# Patient Record
Sex: Male | Born: 1935 | Race: White | Hispanic: No | Smoking: Current every day smoker
Health system: Southern US, Community
[De-identification: ages and names within clinical notes are randomized; demographics above are authoritative.]

## PROBLEM LIST (undated history)

## (undated) DIAGNOSIS — R42 Dizziness and giddiness: Secondary | ICD-10-CM

## (undated) DIAGNOSIS — I350 Nonrheumatic aortic (valve) stenosis: Secondary | ICD-10-CM

## (undated) DIAGNOSIS — E785 Hyperlipidemia, unspecified: Secondary | ICD-10-CM

## (undated) DIAGNOSIS — Z952 Presence of prosthetic heart valve: Secondary | ICD-10-CM

## (undated) DIAGNOSIS — K635 Polyp of colon: Secondary | ICD-10-CM

## (undated) DIAGNOSIS — M1990 Unspecified osteoarthritis, unspecified site: Secondary | ICD-10-CM

## (undated) DIAGNOSIS — I10 Essential (primary) hypertension: Secondary | ICD-10-CM

## (undated) DIAGNOSIS — R911 Solitary pulmonary nodule: Secondary | ICD-10-CM

## (undated) DIAGNOSIS — J210 Acute bronchiolitis due to respiratory syncytial virus: Secondary | ICD-10-CM

## (undated) HISTORY — PX: CATARACT EXTRACTION: SUR2

## (undated) HISTORY — PX: EXCISIONAL HEMORRHOIDECTOMY: SHX1541

## (undated) HISTORY — DX: Hyperlipidemia, unspecified: E78.5

## (undated) HISTORY — DX: Polyp of colon: K63.5

## (undated) HISTORY — DX: Solitary pulmonary nodule: R91.1

## (undated) HISTORY — DX: Nonrheumatic aortic (valve) stenosis: I35.0

## (undated) HISTORY — DX: Essential (primary) hypertension: I10

---

## 2000-08-26 DIAGNOSIS — K635 Polyp of colon: Secondary | ICD-10-CM

## 2000-08-26 HISTORY — DX: Polyp of colon: K63.5

## 2000-12-12 ENCOUNTER — Ambulatory Visit (HOSPITAL_COMMUNITY): Admission: RE | Admit: 2000-12-12 | Discharge: 2000-12-12 | Payer: Self-pay | Admitting: Internal Medicine

## 2001-08-17 ENCOUNTER — Ambulatory Visit (HOSPITAL_COMMUNITY): Admission: RE | Admit: 2001-08-17 | Discharge: 2001-08-17 | Payer: Self-pay | Admitting: General Surgery

## 2001-08-17 HISTORY — PX: COLONOSCOPY: SHX174

## 2011-07-12 ENCOUNTER — Encounter: Payer: Self-pay | Admitting: Internal Medicine

## 2011-07-15 ENCOUNTER — Ambulatory Visit (INDEPENDENT_AMBULATORY_CARE_PROVIDER_SITE_OTHER): Payer: Medicare Other | Admitting: Urgent Care

## 2011-07-15 ENCOUNTER — Encounter: Payer: Self-pay | Admitting: Urgent Care

## 2011-07-15 VITALS — BP 102/69 | HR 83 | Temp 97.8°F | Ht 66.0 in | Wt 186.0 lb

## 2011-07-15 DIAGNOSIS — Z86010 Personal history of colonic polyps: Secondary | ICD-10-CM

## 2011-07-15 NOTE — Assessment & Plan Note (Signed)
Travis Murphy is a 76 y.o. male here to set up average risk screening colonoscopy.  He does have history of a hyperplastic polyp removed in 2002.  He denies any GI symptoms at this time.  I have discussed risks & benefits which include, but are not limited to, bleeding, infection, perforation & drug reaction.  The patient agrees with this plan & written consent will be obtained.

## 2011-07-15 NOTE — Progress Notes (Signed)
Cc to PCP 

## 2011-07-15 NOTE — Patient Instructions (Addendum)
Keep colonoscopy as planned 

## 2011-07-15 NOTE — Progress Notes (Addendum)
Referring Provider:  Dr Ouida Sills Primary Care Physician:  Carylon Perches, MD Primary Gastroenterologist:  Dr. Jena Gauss  Chief Complaint  Patient presents with  . Colonoscopy    HPI:  Travis Murphy is a 76 y.o. male here as a referral from Dr. Ouida Sills to set up colonoscopy. He gives history of colon polyp in 2002.  This was actually a hyperplastic polyp.   Denies any lower GI symptoms including constipation, diarrhea, rectal bleeding, melena or weight loss.   Denies any upper GI symptoms including heartburn, indigestion, nausea, vomiting, dysphagia, odynophagia or anorexia.    Past Medical History  Diagnosis Date  . Hypertension   . Hyperplastic colon polyp 2002    Dr. Lovell Sheehan  . Hyperlipemia   . Aortic sclerosis   . Diabetes mellitus     diet-controlled    Past Surgical History  Procedure Date  . Colonoscopy 08/17/2001    small polyp measured 3 mm removed/multiple middle scattered diverticula in the ascending colon-hyperplastic    Current Outpatient Prescriptions  Medication Sig Dispense Refill  . amLODipine-atorvastatin (CADUET) 5-10 MG per tablet Take 1 tablet by mouth daily.       Marland Kitchen aspirin 81 MG tablet Take 81 mg by mouth daily.        . ramipril (ALTACE) 10 MG capsule Take 10 mg by mouth daily.         Allergies as of 07/15/2011  . (No Known Allergies)    Family history:There is no known family history of colorectal carcinoma , liver disease, or inflammatory bowel disease.   History   Social History  . Marital Status: Married    Spouse Name: N/A    Number of Children: 2  . Years of Education: N/A   Occupational History  . retired; [employer]    Social History Main Topics  . Smoking status: Former Smoker -- 0.5 packs/day for 20 years    Types: Cigarettes    Quit date: 07/10/2011  . Smokeless tobacco: Not on file   Comment: quit about 4-5 yrs ago  . Alcohol Use: No  . Drug Use: No  . Sexually Active: Not on file   Other Topics Concern  . Not on file    Social History Narrative   Widower (married for 87yrs)   Review of Systems: Gen: Denies any fever, chills, sweats, anorexia, fatigue, weakness, malaise, weight loss, and sleep disorder CV: Denies chest pain, angina, palpitations, syncope, orthopnea, PND, peripheral edema, and claudication. Resp: Denies dyspnea at rest, dyspnea with exercise, cough, sputum, wheezing, coughing up blood, and pleurisy. GI: Denies vomiting blood, jaundice, and fecal incontinence.   Denies dysphagia or odynophagia. GU : Denies urinary burning, blood in urine, urinary frequency, urinary hesitancy, nocturnal urination, and urinary incontinence. MS: Denies joint pain, limitation of movement, and swelling, stiffness, low back pain, extremity pain. Denies muscle weakness, cramps, atrophy.  Derm: Denies rash, itching, dry skin, hives, moles, warts, or unhealing ulcers.  Psych: Denies depression, anxiety, memory loss, suicidal ideation, hallucinations, paranoia, and confusion. Heme: Denies bruising, bleeding, and enlarged lymph nodes.  Physical Exam: BP 102/69  Pulse 83  Temp(Src) 97.8 F (36.6 C) (Temporal)  Ht 5\' 6"  (1.676 m)  Wt 186 lb (84.369 kg)  BMI 30.02 kg/m2 General:   Alert,  Well-developed, well-nourished, pleasant and cooperative in NAD Head:  Normocephalic and atraumatic. Eyes:  Sclera clear, no icterus.   Conjunctiva pink. Ears:  Normal auditory acuity. Nose:  No deformity, discharge,  or lesions. Mouth:  No deformity or  lesions, oropharynx pink & moist. Neck:  Supple; no masses or thyromegaly. Lungs:  Clear throughout to auscultation.   No wheezes, crackles, or rhonchi. No acute distress. Heart:  Regular rate and rhythm; no murmurs, clicks, rubs,  or gallops. Abdomen:  Soft, nontender and nondistended. No masses, hepatosplenomegaly or hernias noted. Normal bowel sounds, without guarding, and without rebound.   Rectal:  Deferred until time of colonoscopy.   Msk:  Symmetrical without gross  deformities. Normal posture. Pulses:  Normal pulses noted. Extremities:  Without clubbing or edema. Neurologic:  Alert and  oriented x4;  grossly normal neurologically. Skin:  Intact without significant lesions or rashes. Cervical Nodes:  No significant cervical adenopathy. Psych:  Alert and cooperative. Normal mood and affect.

## 2011-08-01 ENCOUNTER — Encounter (HOSPITAL_COMMUNITY): Payer: Self-pay | Admitting: Pharmacy Technician

## 2011-08-13 MED ORDER — SODIUM CHLORIDE 0.45 % IV SOLN
Freq: Once | INTRAVENOUS | Status: AC
  Administered 2011-08-14: 10:00:00 via INTRAVENOUS

## 2011-08-14 ENCOUNTER — Encounter (HOSPITAL_COMMUNITY)
Admission: RE | Disposition: A | Discharge status: Home / Self Care | Payer: Self-pay | Source: Ambulatory Visit | Attending: Internal Medicine

## 2011-08-14 ENCOUNTER — Encounter (HOSPITAL_COMMUNITY): Payer: Self-pay | Admitting: *Deleted

## 2011-08-14 ENCOUNTER — Ambulatory Visit (HOSPITAL_COMMUNITY)
Admission: RE | Admit: 2011-08-14 | Discharge: 2011-08-14 | Disposition: A | Discharge status: Home / Self Care | Payer: Medicare Other | Source: Ambulatory Visit | Attending: Internal Medicine | Admitting: Internal Medicine

## 2011-08-14 ENCOUNTER — Other Ambulatory Visit: Payer: Self-pay | Admitting: Internal Medicine

## 2011-08-14 DIAGNOSIS — Z86010 Personal history of colonic polyps: Secondary | ICD-10-CM

## 2011-08-14 DIAGNOSIS — Z1211 Encounter for screening for malignant neoplasm of colon: Secondary | ICD-10-CM

## 2011-08-14 DIAGNOSIS — E119 Type 2 diabetes mellitus without complications: Secondary | ICD-10-CM | POA: Insufficient documentation

## 2011-08-14 DIAGNOSIS — I10 Essential (primary) hypertension: Secondary | ICD-10-CM | POA: Insufficient documentation

## 2011-08-14 DIAGNOSIS — Z79899 Other long term (current) drug therapy: Secondary | ICD-10-CM | POA: Insufficient documentation

## 2011-08-14 DIAGNOSIS — D126 Benign neoplasm of colon, unspecified: Secondary | ICD-10-CM

## 2011-08-14 DIAGNOSIS — E785 Hyperlipidemia, unspecified: Secondary | ICD-10-CM | POA: Insufficient documentation

## 2011-08-14 DIAGNOSIS — Z7982 Long term (current) use of aspirin: Secondary | ICD-10-CM | POA: Insufficient documentation

## 2011-08-14 DIAGNOSIS — K5730 Diverticulosis of large intestine without perforation or abscess without bleeding: Secondary | ICD-10-CM | POA: Insufficient documentation

## 2011-08-14 HISTORY — PX: COLONOSCOPY: SHX5424

## 2011-08-14 SURGERY — COLONOSCOPY
Anesthesia: Moderate Sedation

## 2011-08-14 MED ORDER — STERILE WATER FOR IRRIGATION IR SOLN
Status: DC | PRN
  Administered 2011-08-14: 10:00:00

## 2011-08-14 MED ORDER — MIDAZOLAM HCL 5 MG/5ML IJ SOLN
INTRAMUSCULAR | Status: AC
  Filled 2011-08-14: qty 10

## 2011-08-14 MED ORDER — MEPERIDINE HCL 100 MG/ML IJ SOLN
INTRAMUSCULAR | Status: AC
  Filled 2011-08-14: qty 2

## 2011-08-14 MED ORDER — MIDAZOLAM HCL 5 MG/5ML IJ SOLN
INTRAMUSCULAR | Status: DC | PRN
  Administered 2011-08-14: 2 mg via INTRAVENOUS

## 2011-08-14 MED ORDER — MEPERIDINE HCL 100 MG/ML IJ SOLN
INTRAMUSCULAR | Status: DC | PRN
  Administered 2011-08-14: 50 mg via INTRAVENOUS

## 2011-08-14 NOTE — Op Note (Signed)
[facility] [address] [place], [place]  [address]  COLONOSCOPY PROCEDURE REPORT  PATIENT:  Travis, Murphy  MR#:  [id number] BIRTHDATE:  [birth year], 76 yrs. old  GENDER:  male ENDOSCOPIST:  R. Roetta Sessions, MD FACP [facility] REF. BY:           Dr. Carylon Perches PROCEDURE DATE:  08/14/2011 PROCEDURE:    Colonoscopy and snare polypectomy  INDICATIONS:   Screening colonoscopy-average risk (last colonoscopy 10 years ago-hyperplastic polyp )  INFORMED CONSENT:  The risks, benefits, alternatives and imponderables including but not limited to bleeding, perforation as well as the possibility of a missed lesion have been reviewed. The potential for biopsy, lesion removal, etc. have also been discussed.  Questions have been answered.  All parties agreeable. Please see the history and physical in the medical record for more information.  MEDICATIONS:   Versed 2 mg IV and Demerol 50 mg IV single dose  DESCRIPTION OF PROCEDURE:  After a digital rectal exam was performed, the EC-[id number] ([id number]) colonoscope was advanced from the anus through the rectum and colon to the area of the cecum, ileocecal valve and appendiceal orifice.  The cecum was deeply intubated.  These structures were well-seen and photographed for the record.  From the level of the cecum and ileocecal valve, the scope was slowly and cautiously withdrawn.  The mucosal surfaces were carefully surveyed utilizing scope tip deflection to facilitate fold flattening as needed.  The scope was pulled down into the rectum where a thorough examination including retroflexion was performed. <<PROCEDUREIMAGES>>  FINDINGS: good prep. (1) 8 mm pedunculated polyp in the mid descending colon and pan-colonic diverticula ; remainder colonic and rectal mucosa       appeared normal  THERAPEUTIC / DIAGNOSTIC MANEUVERS PERFORMED: the above mentioned polyp was hot snare removed cleanly and recovered for the pathologist  COMPLICATIONS:    none  CECAL WITHDRAWAL TIME: 9 minutes  IMPRESSION:   Colonic polyp-removed as described above. Pancolonic diverticulosis  RECOMMENDATIONS: Followup on pathology report  ______________________________ R. Roetta Sessions, MD [name]  CC:  n. eSIGNED:   R. Roetta Sessions at 08/14/2011 10:38 AM  Curlene Dolphin, [phone]

## 2011-08-14 NOTE — H&P (Signed)
  I have seen & examined the patient prior to the procedure(s) today and reviewed the history and physical/consultation.  There have been no changes.  After consideration of the risks, benefits, alternatives and imponderables, the patient has consented to the procedure(s).   

## 2011-08-29 ENCOUNTER — Encounter (HOSPITAL_COMMUNITY): Payer: Self-pay | Admitting: Internal Medicine

## 2011-11-06 ENCOUNTER — Ambulatory Visit (HOSPITAL_COMMUNITY)
Admission: RE | Admit: 2011-11-06 | Discharge: 2011-11-06 | Disposition: A | Discharge status: Home / Self Care | Payer: Medicare Other | Source: Ambulatory Visit | Attending: Internal Medicine | Admitting: Internal Medicine

## 2011-11-06 DIAGNOSIS — I359 Nonrheumatic aortic valve disorder, unspecified: Secondary | ICD-10-CM | POA: Insufficient documentation

## 2011-11-06 DIAGNOSIS — I10 Essential (primary) hypertension: Secondary | ICD-10-CM | POA: Insufficient documentation

## 2011-11-06 NOTE — Progress Notes (Signed)
*  PRELIMINARY RESULTS* Echocardiogram 2D Echocardiogram has been performed.  Travis Murphy 11/06/2011, 8:19 AM

## 2012-08-19 ENCOUNTER — Encounter (HOSPITAL_COMMUNITY): Payer: Self-pay | Admitting: Emergency Medicine

## 2012-08-19 ENCOUNTER — Emergency Department (HOSPITAL_COMMUNITY): Payer: Medicare Other

## 2012-08-19 ENCOUNTER — Emergency Department (HOSPITAL_COMMUNITY)
Admission: EM | Admit: 2012-08-19 | Discharge: 2012-08-19 | Disposition: A | Discharge status: Home / Self Care | Payer: Medicare Other | Attending: Emergency Medicine | Admitting: Emergency Medicine

## 2012-08-19 DIAGNOSIS — I10 Essential (primary) hypertension: Secondary | ICD-10-CM | POA: Insufficient documentation

## 2012-08-19 DIAGNOSIS — Z860100 Personal history of colon polyps, unspecified: Secondary | ICD-10-CM | POA: Insufficient documentation

## 2012-08-19 DIAGNOSIS — Z8679 Personal history of other diseases of the circulatory system: Secondary | ICD-10-CM | POA: Insufficient documentation

## 2012-08-19 DIAGNOSIS — F17200 Nicotine dependence, unspecified, uncomplicated: Secondary | ICD-10-CM | POA: Insufficient documentation

## 2012-08-19 DIAGNOSIS — R112 Nausea with vomiting, unspecified: Secondary | ICD-10-CM | POA: Insufficient documentation

## 2012-08-19 DIAGNOSIS — Z79899 Other long term (current) drug therapy: Secondary | ICD-10-CM | POA: Insufficient documentation

## 2012-08-19 DIAGNOSIS — E785 Hyperlipidemia, unspecified: Secondary | ICD-10-CM | POA: Insufficient documentation

## 2012-08-19 DIAGNOSIS — Z7982 Long term (current) use of aspirin: Secondary | ICD-10-CM | POA: Insufficient documentation

## 2012-08-19 DIAGNOSIS — R42 Dizziness and giddiness: Secondary | ICD-10-CM | POA: Insufficient documentation

## 2012-08-19 LAB — BASIC METABOLIC PANEL
Chloride: 102 mEq/L (ref 96–112)
Creatinine, Ser: 0.79 mg/dL (ref 0.50–1.35)
GFR calc Af Amer: >90 mL/min (ref >90)
Potassium: 3.8 mEq/L (ref 3.5–5.1)
Sodium: 139 mEq/L (ref 135–145)

## 2012-08-19 LAB — CBC WITH DIFFERENTIAL/PLATELET
Basophils Absolute: 0 10*3/uL (ref 0.0–0.1)
Basophils Relative: 0 % (ref 0–1)
MCHC: 35.2 g/dL (ref 30.0–36.0)
Monocytes Absolute: 0.4 10*3/uL (ref 0.1–1.0)
Neutro Abs: 5.5 10*3/uL (ref 1.7–7.7)
Neutrophils Relative %: 70 % (ref 43–77)
Platelets: 181 10*3/uL (ref 150–400)
RDW: 13.4 % (ref 11.5–15.5)

## 2012-08-19 MED ORDER — MECLIZINE HCL 12.5 MG PO TABS
ORAL_TABLET | ORAL | Status: AC
  Administered 2012-08-19: 50 mg
  Filled 2012-08-19: qty 4

## 2012-08-19 MED ORDER — ONDANSETRON HCL 4 MG/2ML IJ SOLN
4.0000 mg | Freq: Once | INTRAMUSCULAR | Status: AC
  Administered 2012-08-19: 4 mg via INTRAVENOUS
  Filled 2012-08-19: qty 2

## 2012-08-19 MED ORDER — MECLIZINE HCL 25 MG PO TABS: 25.0000 mg | ORAL_TABLET | Freq: Four times a day (QID) | ORAL | Status: DC

## 2012-08-19 MED ORDER — MECLIZINE HCL 12.5 MG PO TABS
25.0000 mg | ORAL_TABLET | Freq: Once | ORAL | Status: AC
  Administered 2012-08-19: 25 mg via ORAL
  Filled 2012-08-19: qty 2

## 2012-08-19 NOTE — ED Notes (Signed)
Cardiac monitor applied at time of arrival. NSR>

## 2012-08-19 NOTE — ED Notes (Signed)
Pt states he woke up dizzy this morning. Denies pain. States vomiting x 2 today. NSR showing on monitor. VSS.

## 2012-08-19 NOTE — ED Provider Notes (Signed)
History  This chart was scribed for Shelda Jakes, MD by Bennett Scrape, ED Scribe. This patient was seen in room APA09/APA09 and the patient's care was started at 11:40 AM.  CSN: [id number]  Arrival date & time 08/19/12  1024   First MD Initiated Contact with Patient 08/19/12 1140      Chief Complaint  Patient presents with  . Dizziness  . Emesis     The history is provided by the patient. No language interpreter was used.    Travis Murphy is a 77 y.o. male who presents to the Emergency Department complaining of 6 hours of sudden onset, non-changing, constant dizziness described as the room spinning with associated nausea and 2 episodes of non-bloody emesis, one in the ED. The dizziness is occasionally worse with head positioning and walking. He denies having prior episodes of similar symptoms. He denies any recent falls or head traumas. He denies having CP, abdominal pain, visual disturbances, and hearing changes as associated symptoms. He has a h/o HTN and HLD. He is a current everyday smoker but denies alcohol use.  PCP is Dr. Ouida Sills.   Past Medical History  Diagnosis Date  . Hypertension   . Hyperplastic colon polyp 2002    Dr. Lovell Sheehan  . Hyperlipemia   . Aortic sclerosis     Past Surgical History  Procedure Date  . Colonoscopy 08/17/2001    small polyp measured 3 mm removed/multiple middle scattered diverticula in the ascending colon-hyperplastic  . Colonoscopy 08/14/2011    Procedure: COLONOSCOPY;  Surgeon: Corbin Ade, MD;  [facility];  Service: Endoscopy;  Laterality: N/A;  10:00    History reviewed. No pertinent family history.  History  Substance Use Topics  . Smoking status: Current Every Day Smoker -- 0.5 packs/day for 21 years    Types: Cigarettes  . Smokeless tobacco: Never Used     Comment: quit about 4-5 yrs ago  . Alcohol Use: No      Review of Systems  Constitutional: Negative for fever and chills.  HENT: Negative  for congestion and sore throat.   Eyes: Negative for visual disturbance.  Respiratory: Negative for cough and shortness of breath.   Cardiovascular: Negative for chest pain.  Gastrointestinal: Positive for nausea and vomiting. Negative for abdominal pain and diarrhea.  Genitourinary: Negative for dysuria and hematuria.  Musculoskeletal: Negative for back pain.  Skin: Negative for rash.  Neurological: Positive for dizziness.  Hematological: Does not bruise/bleed easily.  All other systems reviewed and are negative.    Allergies  Review of patient's allergies indicates no known allergies.  Home Medications   Current Outpatient Rx  Name  Route  Sig  Dispense  Refill  . AMLODIPINE-ATORVASTATIN 5-10 MG PO TABS   Oral   Take 1 tablet by mouth daily.          . ASPIRIN EC 81 MG PO TBEC   Oral   Take 81 mg by mouth daily.           Marland Kitchen RAMIPRIL 10 MG PO CAPS   Oral   Take 10 mg by mouth daily.          Marland Kitchen MECLIZINE HCL 25 MG PO TABS   Oral   Take 1 tablet (25 mg total) by mouth 4 (four) times daily.   2 tablet   0   . MECLIZINE HCL 25 MG PO TABS   Oral   Take 1 tablet (25 mg total) by mouth 4 (  four) times daily.   28 tablet   0     Triage Vitals: BP 114/64  Pulse 73  Resp 16  SpO2 93%  Physical Exam  Nursing note and vitals reviewed. Constitutional: He is oriented to person, place, and time. He appears well-developed and well-nourished. No distress.  HENT:  Head: Normocephalic and atraumatic.  Mouth/Throat: Oropharynx is clear and moist. No oropharyngeal exudate.       Moist mucous membranes   Eyes: Conjunctivae normal and EOM are normal. Pupils are equal, round, and reactive to light.  Neck: Normal range of motion. Neck supple. No tracheal deviation present.  Cardiovascular: Normal rate and regular rhythm.   Murmur (mild systolic murmur) heard. Pulmonary/Chest: Effort normal and breath sounds normal. No respiratory distress.  Abdominal: Soft. Bowel sounds  are normal. There is no tenderness.  Musculoskeletal: Normal range of motion. He exhibits no edema.  Neurological: He is alert and oriented to person, place, and time. No cranial nerve deficit.       Pt able to move both sets of fingers and toes, normal heel to shin, no pronator's drift, unable to reproduce dizziness on exam  Skin: Skin is warm and dry.  Psychiatric: He has a normal mood and affect. His behavior is normal.    ED Course  Procedures (including critical care time)  DIAGNOSTIC STUDIES: Oxygen Saturation is 93% on room air, adequate by my interpretation.    COORDINATION OF CARE:  11:00 AM- Ordered 4 mg Zofran injection.  12:05 PM- Discussed treatment plan which includes CXR and CT scan of the head and antivert with pt at bedside and pt agreed to plan.   Labs Reviewed  BASIC METABOLIC PANEL - Abnormal; Notable for the following:    Glucose, Bld 168 (*)     GFR calc non Af Amer 86 (*)     All other components within normal limits  CBC WITH DIFFERENTIAL   Dg Chest 2 View  08/19/2012  *RADIOLOGY REPORT*  Clinical Data: Dizziness.  Smoker.  CHEST - 2 VIEW  Comparison: None.  Findings: There is peribronchial thickening.  No consolidative process, pneumothorax or effusion.  Heart size is normal. Elevation of the right hemidiaphragm and old right clavicle fracture noted.  IMPRESSION: Bronchitic change without focal process.   Original Report Authenticated By: Holley Dexter, M.D.    Ct Head Wo Contrast  08/19/2012  *RADIOLOGY REPORT*  Clinical Data: Dizziness  CT HEAD WITHOUT CONTRAST  Technique:  Contiguous axial images were obtained from the base of the skull through the vertex without contrast.  Comparison: None.  Findings: No skull fracture is noted.  Paranasal sinuses and mastoid air cells are unremarkable.  No intracranial hemorrhage, mass effect or midline shift.  Mild cerebral atrophy.  Periventricular and patchy subcortical white matter decreased attenuation is  probable due to chronic small vessel ischemic changes.  No definite acute cortical infarction. No mass lesion is noted on this unenhanced scan.  IMPRESSION: No acute intracranial abnormality.Mild cerebral atrophy. Periventricular and patchy subcortical white matter decreased attenuation is probable due to chronic small vessel ischemic changes.  No definite acute cortical infarction.   Original Report Authenticated By: Natasha Mead, M.D.      1. Vertigo       MDM   Patient improved in the emergency department head CT without any acute or new findings. Lab workup without any sniffing abnormalities. Patient with positional vertigo nausea and vomiting now improved. Patient was given Antivert in the emergency department. Patient will  be given 2 tablets of Antivert to go home with due to the fact that this is Christmas and many pharmacies may not be open. Prescription for additional Antivert provided. Patient will followup with primary care Dr. If not improving in the next few days return for any new or worse symptoms. Do not think this was an acute stroke however that is in the differential and the family knows return for any newer worse symptoms.      I personally performed the services described in this documentation, which was scribed in my presence. The recorded information has been reviewed and is accurate.     Shelda Jakes, MD 08/19/12 [phone]

## 2012-08-19 NOTE — ED Notes (Signed)
Patient reports waking this morning and feeling slightly dizzy. Per patient dizziness progressively getting worse. Patient reports vomiting x1 at home. Patient vomited x1 while being triaged. Patient's family reports patient having high blood pressure. Patient denies being sick prior to this, denies weakness.

## 2014-07-06 ENCOUNTER — Encounter: Payer: Self-pay | Admitting: Internal Medicine

## 2014-12-02 DIAGNOSIS — E785 Hyperlipidemia, unspecified: Secondary | ICD-10-CM | POA: Diagnosis not present

## 2014-12-02 DIAGNOSIS — Z79899 Other long term (current) drug therapy: Secondary | ICD-10-CM | POA: Diagnosis not present

## 2014-12-02 DIAGNOSIS — Z125 Encounter for screening for malignant neoplasm of prostate: Secondary | ICD-10-CM | POA: Diagnosis not present

## 2014-12-02 DIAGNOSIS — E119 Type 2 diabetes mellitus without complications: Secondary | ICD-10-CM | POA: Diagnosis not present

## 2014-12-02 DIAGNOSIS — I10 Essential (primary) hypertension: Secondary | ICD-10-CM | POA: Diagnosis not present

## 2014-12-09 DIAGNOSIS — Z0001 Encounter for general adult medical examination with abnormal findings: Secondary | ICD-10-CM | POA: Diagnosis not present

## 2014-12-09 DIAGNOSIS — I10 Essential (primary) hypertension: Secondary | ICD-10-CM | POA: Diagnosis not present

## 2014-12-09 DIAGNOSIS — E785 Hyperlipidemia, unspecified: Secondary | ICD-10-CM | POA: Diagnosis not present

## 2014-12-09 DIAGNOSIS — E1129 Type 2 diabetes mellitus with other diabetic kidney complication: Secondary | ICD-10-CM | POA: Diagnosis not present

## 2015-04-03 DIAGNOSIS — E119 Type 2 diabetes mellitus without complications: Secondary | ICD-10-CM | POA: Diagnosis not present

## 2015-04-11 DIAGNOSIS — R21 Rash and other nonspecific skin eruption: Secondary | ICD-10-CM | POA: Diagnosis not present

## 2015-04-11 DIAGNOSIS — E119 Type 2 diabetes mellitus without complications: Secondary | ICD-10-CM | POA: Diagnosis not present

## 2015-04-11 DIAGNOSIS — I10 Essential (primary) hypertension: Secondary | ICD-10-CM | POA: Diagnosis not present

## 2015-04-11 DIAGNOSIS — Z6830 Body mass index (BMI) 30.0-30.9, adult: Secondary | ICD-10-CM | POA: Diagnosis not present

## 2015-08-07 DIAGNOSIS — E119 Type 2 diabetes mellitus without complications: Secondary | ICD-10-CM | POA: Diagnosis not present

## 2015-08-14 DIAGNOSIS — E1129 Type 2 diabetes mellitus with other diabetic kidney complication: Secondary | ICD-10-CM | POA: Diagnosis not present

## 2015-08-14 DIAGNOSIS — I350 Nonrheumatic aortic (valve) stenosis: Secondary | ICD-10-CM | POA: Diagnosis not present

## 2015-08-14 DIAGNOSIS — I10 Essential (primary) hypertension: Secondary | ICD-10-CM | POA: Diagnosis not present

## 2015-08-14 DIAGNOSIS — Z6829 Body mass index (BMI) 29.0-29.9, adult: Secondary | ICD-10-CM | POA: Diagnosis not present

## 2015-12-07 DIAGNOSIS — Z79899 Other long term (current) drug therapy: Secondary | ICD-10-CM | POA: Diagnosis not present

## 2015-12-07 DIAGNOSIS — I10 Essential (primary) hypertension: Secondary | ICD-10-CM | POA: Diagnosis not present

## 2015-12-07 DIAGNOSIS — E785 Hyperlipidemia, unspecified: Secondary | ICD-10-CM | POA: Diagnosis not present

## 2015-12-07 DIAGNOSIS — I350 Nonrheumatic aortic (valve) stenosis: Secondary | ICD-10-CM | POA: Diagnosis not present

## 2015-12-07 DIAGNOSIS — E119 Type 2 diabetes mellitus without complications: Secondary | ICD-10-CM | POA: Diagnosis not present

## 2015-12-19 DIAGNOSIS — I350 Nonrheumatic aortic (valve) stenosis: Secondary | ICD-10-CM | POA: Diagnosis not present

## 2015-12-19 DIAGNOSIS — E1129 Type 2 diabetes mellitus with other diabetic kidney complication: Secondary | ICD-10-CM | POA: Diagnosis not present

## 2015-12-19 DIAGNOSIS — I10 Essential (primary) hypertension: Secondary | ICD-10-CM | POA: Diagnosis not present

## 2015-12-25 ENCOUNTER — Ambulatory Visit (HOSPITAL_COMMUNITY)
Admission: RE | Admit: 2015-12-25 | Discharge: 2015-12-25 | Disposition: A | Discharge status: Home / Self Care | Payer: Medicare Other | Source: Ambulatory Visit | Attending: Internal Medicine | Admitting: Internal Medicine

## 2015-12-25 DIAGNOSIS — I350 Nonrheumatic aortic (valve) stenosis: Secondary | ICD-10-CM | POA: Diagnosis not present

## 2015-12-25 DIAGNOSIS — I517 Cardiomegaly: Secondary | ICD-10-CM | POA: Diagnosis not present

## 2015-12-25 DIAGNOSIS — I071 Rheumatic tricuspid insufficiency: Secondary | ICD-10-CM | POA: Diagnosis not present

## 2015-12-25 DIAGNOSIS — I340 Nonrheumatic mitral (valve) insufficiency: Secondary | ICD-10-CM | POA: Insufficient documentation

## 2015-12-25 DIAGNOSIS — I5189 Other ill-defined heart diseases: Secondary | ICD-10-CM | POA: Diagnosis not present

## 2015-12-25 DIAGNOSIS — I359 Nonrheumatic aortic valve disorder, unspecified: Secondary | ICD-10-CM | POA: Diagnosis not present

## 2015-12-25 DIAGNOSIS — I352 Nonrheumatic aortic (valve) stenosis with insufficiency: Secondary | ICD-10-CM | POA: Diagnosis not present

## 2015-12-25 DIAGNOSIS — I515 Myocardial degeneration: Secondary | ICD-10-CM | POA: Insufficient documentation

## 2016-01-16 ENCOUNTER — Ambulatory Visit: Payer: Medicare Other | Admitting: Cardiovascular Disease

## 2016-01-25 ENCOUNTER — Encounter: Payer: Self-pay | Admitting: Cardiology

## 2016-01-25 ENCOUNTER — Ambulatory Visit (INDEPENDENT_AMBULATORY_CARE_PROVIDER_SITE_OTHER): Payer: Medicare Other | Admitting: Cardiology

## 2016-01-25 VITALS — BP 120/68 | HR 92 | Ht 65.0 in | Wt 159.0 lb

## 2016-01-25 DIAGNOSIS — I350 Nonrheumatic aortic (valve) stenosis: Secondary | ICD-10-CM

## 2016-01-25 DIAGNOSIS — I10 Essential (primary) hypertension: Secondary | ICD-10-CM | POA: Diagnosis not present

## 2016-01-25 DIAGNOSIS — E785 Hyperlipidemia, unspecified: Secondary | ICD-10-CM

## 2016-01-25 NOTE — Progress Notes (Signed)
Cardiology Office Note  Date: 01/25/2016   ID: JUDE SKIPTON, DOB [birth year], MRN [id number]  PCP: Asencion Noble, MD  Consulting Cardiologist: Rozann Lesches, MD   Chief Complaint  Patient presents with  . Aortic Stenosis    History of Present Illness: Travis Murphy is a 81 y.o. male referred for cardiology consultation by Dr. Willey Blade. He has a history of aortic stenosis with progression to severe range based on his most recent echocardiogram from May of this year. He is a widower, takes care of all of his ADLs including yard work. He enjoys walking 1 mile most days of the week for exercise, also plays golf. He does not endorse any substantial change in overall stamina over the last year. No palpitations, exertional dizziness, or syncope. Reports NYHA class II dyspnea. He has been aware of having a heart murmur and valve condition for several years.  Today we discussed the natural history of aortic stenosis, treatment options including surgical correction and TAVR in some cases.  We went over his medications which are outlined below. He reports no intolerances.  He states that he is retired from [employer] where he worked for 37 years.  Past Medical History  Diagnosis Date  . Essential hypertension   . Hyperplastic colon polyp 2002    Dr. Arnoldo Morale  . Hyperlipemia   . Aortic stenosis   . Type 2 diabetes mellitus ([place])   . Clavicular fracture     Past Surgical History  Procedure Laterality Date  . Colonoscopy  08/17/2001    Small polyp measured 3 mm removed/multiple middle scattered diverticula in the ascending colon-hyperplastic  . Colonoscopy  08/14/2011    Procedure: COLONOSCOPY;  Surgeon: Daneil Dolin, MD;  Location[facility];  Service: Endoscopy;  Laterality: N/A;  10:00  . Cataract surgery Right     Current Outpatient Prescriptions  Medication Sig Dispense Refill  . amLODipine (NORVASC) 5 MG tablet Take 5 mg by mouth daily.  4  . aspirin EC 81 MG tablet  Take 81 mg by mouth daily.      Marland Kitchen atorvastatin (LIPITOR) 10 MG tablet Take 10 mg by mouth daily.  4  . meclizine (ANTIVERT) 25 MG tablet Take 1 tablet (25 mg total) by mouth 4 (four) times daily. 2 tablet 0  . ramipril (ALTACE) 10 MG capsule Take 10 mg by mouth daily.      No current facility-administered medications for this visit.   Allergies:  Review of patient's allergies indicates no known allergies.   Social History: The patient  reports that he has quit smoking. His smoking use included Cigarettes. He has a 10.5 pack-year smoking history. He has never used smokeless tobacco. He reports that he does not drink alcohol or use illicit drugs.   Family History: The patient's family history includes Heart attack in his father; Liver disease in his sister; Renal Disease in his brother; Stroke in his mother.   ROS:  Please see the history of present illness. Otherwise, complete review of systems is positive for seasonal allergies.  All other systems are reviewed and negative.   Physical Exam: VS:  BP 120/68 mmHg  Pulse 92  Ht 5\' 5"  (1.651 m)  Wt 159 lb (72.122 kg)  BMI 26.46 kg/m2  SpO2 92%, BMI Body mass index is 26.46 kg/(m^2).  Wt Readings from Last 3 Encounters:  01/25/16 159 lb (72.122 kg)  08/14/11 186 lb (84.369 kg)  07/15/11 186 lb (84.369 kg)    General:  Elderly male, appears comfortable at rest. HEENT: Conjunctiva and lids normal, oropharynx clear. Neck: Supple, no elevated JVP or carotid bruits, no thyromegaly. Lungs: Clear to auscultation, nonlabored breathing at rest. Cardiac: Regular rate and rhythm, no S3, 3/6 systolic murmur with diminished second heart sound and radiation to the apex with Galliverdin effect, no pericardial rub. Abdomen: Soft, nontender, bowel sounds present, no guarding or rebound. Extremities: No pitting edema, distal pulses 2+. Skin: Warm and dry. Musculoskeletal: Mild kyphosis. Neuropsychiatric: Alert and oriented x3, affect grossly  appropriate.  ECG: I personally reviewed the tracing from 12/19/2015 which showed sinus rhythm with anteroseptal Q waves..  Recent Labwork:  April 2017: BUN 13, creatinine 0.8, potassium 4.1, AST 29, ALT 32, hemoglobin 15.9, platelets 180, cholesterol 77, triglycerides 63, HDL 26, LDL 38, hemoglobin A1c 6.5  Other Studies Reviewed Today:  Echocardiogram 12/25/2015: Study Conclusions  - Left ventricle: The cavity size was normal. There was moderate  focal basal hypertrophy of the septum. Systolic function was  normal. The estimated ejection fraction was in the range of 60%  to 65%. Wall motion was normal; there were no regional wall  motion abnormalities. Doppler parameters are consistent with  abnormal left ventricular relaxation (grade 1 diastolic  dysfunction). - Aortic valve: Mildly calcified annulus. Trileaflet; moderately  calcified leaflets. There was severe stenosis. There was mild  regurgitation. Mean gradient (S): 40 mm Hg. Peak gradient (S): 76  mm Hg. Valve area (VTI): 1.02 cm^2. Valve area (Vmax): 0.87 cm^2.  Valve area (Vmean): 0.92 cm^2. - Mitral valve: Calcified annulus. There was trivial regurgitation. - Right atrium: Central venous pressure (est): 3 mm Hg. - Tricuspid valve: There was trivial regurgitation. - Pulmonary arteries: Systolic pressure could not be accurately  estimated. - Pericardium, extracardiac: A prominent pericardial fat pad was  present.  Impressions:  - Moderate basal septal LV hypertrophy with LVEF 60-65%. Grade 1  diastolic dysfunction with intermediate LV filling pressure. MAC  with trivial mitral regurgitation. Severe calcific aortic  stenosis with mild aortic regurgitation. Gradients have increased  compared to the previous study in 2013. Trivial tricuspid  regurgitation.  Chest x-ray 08/19/2012: Findings: There is peribronchial thickening. No consolidative process, pneumothorax or effusion. Heart size is  normal. Elevation of the right hemidiaphragm and old right clavicle fracture noted.  IMPRESSION: Bronchitic change without focal process.  Assessment and Plan:  1. Severe aortic stenosis by echocardiogram done recently. Patient is reasonably stable from a functional and symptomatic perspective at this time. We have discussed the natural history of aortic stenosis and likelihood that he will require surgical repair, generally within the next year or so. Plan to have him follow-up in 6 months with repeat echocardiogram.  2. Essential hypertension, no changes made present regimen. He is on Altace and Norvasc.  3. Hyperlipidemia, on Lipitor, LDL 38.  Current medicines were reviewed with the patient today.    Orders Placed This Encounter  Procedures  . ECHOCARDIOGRAM COMPLETE    Disposition: FU with me in 6 months.   Signed, Satira Sark, MD, [facility] 01/25/2016 8:34 AM    [facility] [address] [place], [place] [address] Phone: [phone]; Fax: [phone]

## 2016-01-25 NOTE — Patient Instructions (Signed)
Your physician wants you to follow-up in:  6 months You will receive a reminder letter in the mail two months in advance. If you don't receive a letter, please call our office to schedule the follow-up appointment.   Your physician has requested that you have an echocardiogram JUST BEFORE YOUR NEXT VISIT. Echocardiography is a painless test that uses sound waves to create images of your heart. It provides your doctor with information about the size and shape of your heart and how well your heart's chambers and valves are working. This procedure takes approximately one hour. There are no restrictions for this procedure.   Your physician recommends that you continue on your current medications as directed. Please refer to the Current Medication list given to you today.     Thank you for choosing [facility] !

## 2016-03-22 DIAGNOSIS — H2512 Age-related nuclear cataract, left eye: Secondary | ICD-10-CM | POA: Diagnosis not present

## 2016-03-22 DIAGNOSIS — D2311 Other benign neoplasm of skin of right eyelid, including canthus: Secondary | ICD-10-CM | POA: Diagnosis not present

## 2016-03-22 DIAGNOSIS — Z961 Presence of intraocular lens: Secondary | ICD-10-CM | POA: Diagnosis not present

## 2016-04-17 DIAGNOSIS — E119 Type 2 diabetes mellitus without complications: Secondary | ICD-10-CM | POA: Diagnosis not present

## 2016-04-24 DIAGNOSIS — I350 Nonrheumatic aortic (valve) stenosis: Secondary | ICD-10-CM | POA: Diagnosis not present

## 2016-04-24 DIAGNOSIS — E1129 Type 2 diabetes mellitus with other diabetic kidney complication: Secondary | ICD-10-CM | POA: Diagnosis not present

## 2016-08-21 DIAGNOSIS — E119 Type 2 diabetes mellitus without complications: Secondary | ICD-10-CM | POA: Diagnosis not present

## 2016-08-28 DIAGNOSIS — E119 Type 2 diabetes mellitus without complications: Secondary | ICD-10-CM | POA: Diagnosis not present

## 2016-08-28 DIAGNOSIS — Z6824 Body mass index (BMI) 24.0-24.9, adult: Secondary | ICD-10-CM | POA: Diagnosis not present

## 2016-08-28 DIAGNOSIS — I350 Nonrheumatic aortic (valve) stenosis: Secondary | ICD-10-CM | POA: Diagnosis not present

## 2016-12-26 DIAGNOSIS — E119 Type 2 diabetes mellitus without complications: Secondary | ICD-10-CM | POA: Diagnosis not present

## 2016-12-26 DIAGNOSIS — Z79899 Other long term (current) drug therapy: Secondary | ICD-10-CM | POA: Diagnosis not present

## 2016-12-26 DIAGNOSIS — I10 Essential (primary) hypertension: Secondary | ICD-10-CM | POA: Diagnosis not present

## 2016-12-26 DIAGNOSIS — E785 Hyperlipidemia, unspecified: Secondary | ICD-10-CM | POA: Diagnosis not present

## 2016-12-26 DIAGNOSIS — I350 Nonrheumatic aortic (valve) stenosis: Secondary | ICD-10-CM | POA: Diagnosis not present

## 2017-01-02 DIAGNOSIS — I10 Essential (primary) hypertension: Secondary | ICD-10-CM | POA: Diagnosis not present

## 2017-01-02 DIAGNOSIS — I482 Chronic atrial fibrillation: Secondary | ICD-10-CM | POA: Diagnosis not present

## 2017-01-02 DIAGNOSIS — E785 Hyperlipidemia, unspecified: Secondary | ICD-10-CM | POA: Diagnosis not present

## 2017-01-02 DIAGNOSIS — Z0001 Encounter for general adult medical examination with abnormal findings: Secondary | ICD-10-CM | POA: Diagnosis not present

## 2017-05-07 DIAGNOSIS — E119 Type 2 diabetes mellitus without complications: Secondary | ICD-10-CM | POA: Diagnosis not present

## 2017-05-14 DIAGNOSIS — E1129 Type 2 diabetes mellitus with other diabetic kidney complication: Secondary | ICD-10-CM | POA: Diagnosis not present

## 2017-09-08 DIAGNOSIS — E1129 Type 2 diabetes mellitus with other diabetic kidney complication: Secondary | ICD-10-CM | POA: Diagnosis not present

## 2017-09-15 DIAGNOSIS — I10 Essential (primary) hypertension: Secondary | ICD-10-CM | POA: Diagnosis not present

## 2017-09-15 DIAGNOSIS — E1129 Type 2 diabetes mellitus with other diabetic kidney complication: Secondary | ICD-10-CM | POA: Diagnosis not present

## 2017-09-15 DIAGNOSIS — I350 Nonrheumatic aortic (valve) stenosis: Secondary | ICD-10-CM | POA: Diagnosis not present

## 2018-01-01 DIAGNOSIS — H5203 Hypermetropia, bilateral: Secondary | ICD-10-CM | POA: Diagnosis not present

## 2018-01-01 DIAGNOSIS — H524 Presbyopia: Secondary | ICD-10-CM | POA: Diagnosis not present

## 2018-01-01 DIAGNOSIS — H2512 Age-related nuclear cataract, left eye: Secondary | ICD-10-CM | POA: Diagnosis not present

## 2018-01-01 DIAGNOSIS — H52223 Regular astigmatism, bilateral: Secondary | ICD-10-CM | POA: Diagnosis not present

## 2018-01-08 DIAGNOSIS — Z79899 Other long term (current) drug therapy: Secondary | ICD-10-CM | POA: Diagnosis not present

## 2018-01-08 DIAGNOSIS — E1129 Type 2 diabetes mellitus with other diabetic kidney complication: Secondary | ICD-10-CM | POA: Diagnosis not present

## 2018-01-08 DIAGNOSIS — I10 Essential (primary) hypertension: Secondary | ICD-10-CM | POA: Diagnosis not present

## 2018-01-08 DIAGNOSIS — I350 Nonrheumatic aortic (valve) stenosis: Secondary | ICD-10-CM | POA: Diagnosis not present

## 2018-01-08 DIAGNOSIS — E785 Hyperlipidemia, unspecified: Secondary | ICD-10-CM | POA: Diagnosis not present

## 2018-01-15 DIAGNOSIS — I350 Nonrheumatic aortic (valve) stenosis: Secondary | ICD-10-CM | POA: Diagnosis not present

## 2018-01-15 DIAGNOSIS — E1122 Type 2 diabetes mellitus with diabetic chronic kidney disease: Secondary | ICD-10-CM | POA: Diagnosis not present

## 2018-01-15 DIAGNOSIS — E785 Hyperlipidemia, unspecified: Secondary | ICD-10-CM | POA: Diagnosis not present

## 2018-01-15 DIAGNOSIS — Z0001 Encounter for general adult medical examination with abnormal findings: Secondary | ICD-10-CM | POA: Diagnosis not present

## 2018-01-26 ENCOUNTER — Other Ambulatory Visit (HOSPITAL_COMMUNITY): Payer: Self-pay | Admitting: Internal Medicine

## 2018-01-26 ENCOUNTER — Ambulatory Visit (HOSPITAL_COMMUNITY)
Admission: RE | Admit: 2018-01-26 | Discharge: 2018-01-26 | Disposition: A | Discharge status: Home / Self Care | Payer: Medicare Other | Source: Ambulatory Visit | Attending: Internal Medicine | Admitting: Internal Medicine

## 2018-01-26 DIAGNOSIS — Z87891 Personal history of nicotine dependence: Secondary | ICD-10-CM | POA: Insufficient documentation

## 2018-01-26 DIAGNOSIS — I350 Nonrheumatic aortic (valve) stenosis: Secondary | ICD-10-CM

## 2018-01-26 DIAGNOSIS — I083 Combined rheumatic disorders of mitral, aortic and tricuspid valves: Secondary | ICD-10-CM | POA: Insufficient documentation

## 2018-01-26 DIAGNOSIS — I5030 Unspecified diastolic (congestive) heart failure: Secondary | ICD-10-CM | POA: Diagnosis not present

## 2018-01-26 NOTE — Progress Notes (Signed)
*  PRELIMINARY RESULTS* Echocardiogram 2D Echocardiogram has been performed.  Travis Murphy 01/26/2018, 9:38 AM

## 2018-02-18 ENCOUNTER — Ambulatory Visit: Payer: Medicare Other | Admitting: Student

## 2018-02-18 ENCOUNTER — Encounter: Payer: Self-pay | Admitting: Student

## 2018-02-18 VITALS — BP 124/70 | HR 72 | Ht 65.0 in | Wt 150.2 lb

## 2018-02-18 DIAGNOSIS — I350 Nonrheumatic aortic (valve) stenosis: Secondary | ICD-10-CM

## 2018-02-18 DIAGNOSIS — I10 Essential (primary) hypertension: Secondary | ICD-10-CM | POA: Insufficient documentation

## 2018-02-18 DIAGNOSIS — R0609 Other forms of dyspnea: Secondary | ICD-10-CM

## 2018-02-18 DIAGNOSIS — E785 Hyperlipidemia, unspecified: Secondary | ICD-10-CM | POA: Diagnosis not present

## 2018-02-18 NOTE — Progress Notes (Signed)
Cardiology Office Note    Date:  02/18/2018   ID:  Travis Murphy, DOB [birth year], MRN [id number]  PCP:  Asencion Noble, MD  Cardiologist: Rozann Lesches, MD    Chief Complaint  Patient presents with  . Follow-up    Abnormal Echocardiogram    History of Present Illness:    Travis Murphy is a 83 y.o. male with past medical history of HTN and HLD who presents to the office today for overdue follow-up.   He was last examined by Dr. Domenic Murphy in 01/2016 as a new patient referral for aortic stenosis. He denied any recent chest pain or dyspnea on exertion at that time and was active and performing yard work regularly. Given that he denied any recent symptoms, is recommended he follow-up in 6 months with a repeat echocardiogram at that time. He has not been evaluated by Cardiology since.  A repeat echocardiogram was obtained by his PCP on 01/26/2018 and showed a preserved EF of 55-60%, no regional wall motion abnormalities, Grade 2 DD, and severe AS with mean gradient of 58 mm Hg and Valve area (VTI) at 0.55 cm^2.   In talking with the patient today, he initially reports to me that he has overall been doing well since his last office visit approximately 2 years ago and denies any recent changes in his dyspnea. Upon reviewing his echocardiogram results, I invited his wife and daughter into the room for further discussion of this with his permission. His wife reported that he has actually been having worsening shortness of breath over the past 5 to 6 months and that he used to play golf on a weekly basis but has not played since last fall due to his worsening respiratory status.  He denies any recent chest pain, palpitations, orthopnea, PND, lower extremity edema, lightheadedness, or presyncope. Does have occasional dizziness which he has accredited to his diagnosis of vertigo.   Past Medical History:  Diagnosis Date  . Aortic stenosis    a. severe AS by echo in 2017 b. 01/2018: repeat  echo showing EF of 55-60%, Grade 2 DD, and severe AS with mean gradient of 58 mm Hg  . Clavicular fracture   . Essential hypertension   . Hyperlipemia   . Hyperplastic colon polyp 2002   Dr. Arnoldo Murphy  . Type 2 diabetes mellitus ([place])     Past Surgical History:  Procedure Laterality Date  . Cataract surgery Right   . COLONOSCOPY  08/17/2001   Small polyp measured 3 mm removed/multiple middle scattered diverticula in the ascending colon-hyperplastic  . COLONOSCOPY  08/14/2011   Procedure: COLONOSCOPY;  Surgeon: Daneil Dolin, MD;  Location: [facility];  Service: Endoscopy;  Laterality: N/A;  10:00    Current Medications: Outpatient Medications Prior to Visit  Medication Sig Dispense Refill  . aspirin EC 81 MG tablet Take 81 mg by mouth daily.      Travis Murphy atorvastatin (LIPITOR) 10 MG tablet Take 10 mg by mouth daily.  4  . meclizine (ANTIVERT) 25 MG tablet Take 1 tablet (25 mg total) by mouth 4 (four) times daily. 2 tablet 0  . ramipril (ALTACE) 10 MG capsule Take 10 mg by mouth daily.     Travis Murphy amLODipine (NORVASC) 5 MG tablet Take 5 mg by mouth daily.  4   No facility-administered medications prior to visit.      Allergies:   Patient has no known allergies.   Social History   Socioeconomic History  . Marital  status: Widowed    Spouse name: Not on file  . Number of children: 2  . Years of education: Not on file  . Highest education level: Not on file  Occupational History  . Occupation: retired; [employer]  . Financial resource strain: Not on file  . Food insecurity:    Worry: Not on file    Inability: Not on file  . Transportation needs:    Medical: Not on file    Non-medical: Not on file  Tobacco Use  . Smoking status: Former Smoker    Packs/day: 0.50    Years: 21.00    Pack years: 10.50    Types: Cigarettes  . Smokeless tobacco: Never Used  . Tobacco comment: quit about 4-5 yrs ago  Substance and Sexual Activity  . Alcohol use: No    Alcohol/week:  0.0 oz  . Drug use: No  . Sexual activity: Not Currently  Lifestyle  . Physical activity:    Days per week: Not on file    Minutes per session: Not on file  . Stress: Not on file  Relationships  . Social connections:    Talks on phone: Not on file    Gets together: Not on file    Attends religious service: Not on file    Active member of club or organization: Not on file    Attends meetings of clubs or organizations: Not on file    Relationship status: Not on file  Other Topics Concern  . Not on file  Social History Narrative   Widower (married for 7yrs); Remarried in 2016        Family History:  The patient's family history includes Heart attack in his father; Liver disease in his sister; Renal Disease in his brother; Stroke in his mother.   Review of Systems:   Please see the history of present illness.     General:  No chills, fever, night sweats or weight changes.  Cardiovascular:  No chest pain, edema, orthopnea, palpitations, paroxysmal nocturnal dyspnea. Positive for dyspnea on exertion.  Dermatological: No rash, lesions/masses Respiratory: No cough, dyspnea Urologic: No hematuria, dysuria Abdominal:   No nausea, vomiting, diarrhea, bright red blood per rectum, melena, or hematemesis Neurologic:  No visual changes, wkns, changes in mental status. All other systems reviewed and are otherwise negative except as noted above.   Physical Exam:    VS:  BP 124/70   Pulse 72   Ht 5\' 5"  (1.651 m)   Wt 150 lb 3.2 oz (68.1 kg)   SpO2 93% Comment: on room air  BMI 24.99 kg/m    General: Well developed, elderly Caucasian male appearing in no acute distress. Head: Normocephalic, atraumatic, sclera non-icteric, no xanthomas, nares are without discharge.  Neck: No carotid bruits. JVD not elevated.  Lungs: Respirations regular and unlabored, without wheezes or rales.  Heart: Regular rate and rhythm. No S3 or S4.  No rubs or gallops appreciated. 3/6 SEM along RUSB.  Abdomen:  Soft, non-tender, non-distended with normoactive bowel sounds. No hepatomegaly. No rebound/guarding. No obvious abdominal masses. Msk:  Strength and tone appear normal for age. No joint deformities or effusions. Extremities: No clubbing or cyanosis. No lower extremity edema.  Distal pedal pulses are 2+ bilaterally. Neuro: Alert and oriented X 3. Moves all extremities spontaneously. No focal deficits noted. Psych:  Responds to questions appropriately with a normal affect. Skin: No rashes or lesions noted  Wt Readings from Last 3 Encounters:  02/18/18 150  lb 3.2 oz (68.1 kg)  01/25/16 159 lb (72.1 kg)  08/14/11 186 lb (84.4 kg)     Studies/Labs Reviewed:   EKG:  EKG is ordered today.  The ekg ordered today demonstrates NSR, HR 72, with PAC's. No acute ST or T-wave changes.    Recent Labs: No results found for requested labs within last 8760 hours.   Lipid Panel No results found for: CHOL, TRIG, HDL, CHOLHDL, VLDL, LDLCALC, LDLDIRECT  Additional studies/ records that were reviewed today include:   Echocardiogram: 01/26/2018 Study Conclusions  - Left ventricle: The cavity size was normal. Wall thickness was   increased in a pattern of mild LVH. Systolic function was normal.   The estimated ejection fraction was in the range of 55% to 60%.   Wall motion was normal; there were no regional wall motion   abnormalities. Features are consistent with a pseudonormal left   ventricular filling pattern, with concomitant abnormal relaxation   and increased filling pressure (grade 2 diastolic dysfunction). - Aortic valve: Functionally bicuspid; moderately calcified   leaflets. There was severe stenosis. There was mild   regurgitation. Mean gradient (S): 58 mm Hg. Peak gradient (S): 93   mm Hg. VTI ratio of LVOT to aortic valve: 0.17. Valve area (VTI):   0.55 cm^2. Valve area (Vmax): 0.51 cm^2. Valve area (Vmean): 0.47   cm^2. - Mitral valve: Mildly calcified annulus. There was mild    regurgitation. - Left atrium: The atrium was mildly dilated. - Right atrium: Central venous pressure (est): 3 mm Hg. - Tricuspid valve: There was mild regurgitation. - Pulmonary arteries: Systolic pressure could not be accurately   estimated. - Pericardium, extracardiac: There was no pericardial effusion.  Impressions:  - Trileaflet but functionally bicuspid aortic valve, moderately   calcified. There is severe aortic stenosis that has further   progressed compared to the prior study in 2017. Mild aortic   regurgitation.   Assessment:    1. Aortic valve stenosis, etiology of cardiac valve disease unspecified   2. Dyspnea on exertion   3. Essential hypertension   4. Hyperlipidemia, unspecified hyperlipidemia type      Plan:   In order of problems listed above:  1. Aortic Stenosis/ Dyspnea on Exertion - the patient is a very pleasant gentleman but is stoic in describing his symptoms and says he has overall done well but his family provide further insight into this and say that his worsening dyspnea has influenced his daily life as he is not as active in the yard as he once was and has not played golf in several months.  - his recent echo showed a preserved EF of 55-60%, no regional wall motion abnormalities, Grade 2 DD, and severe AS with mean gradient of 58 mm Hg and Valve area (VTI) at 0.55 cm^2. We reviewed the echo in detail along with the management and treatment options of AS and he is agreement for surgical intervention as he is active for his age. Will refer to the Structural Heart Team for consideration of AVR with possible TAVR. We discussed that he will need to have a R/LHC prior to any surgical treatments and he wishes to discuss valve replacement further with Dr. Burt Knack or Dr. Angelena Form prior to his cath being scheduled. Therefore, will send a staff message to Nell Range, PA-C today so he can get scheduled with an initial consult.   2. HTN - BP is well-controlled at  124/70 during today's visit.  - continue Ramipril 10mg  daily.  3. HLD - no recent labs available for review in EPIC. Followed by PCP and remains on Atorvastatin 10mg  daily.    Medication Adjustments/Labs and Tests Ordered: Current medicines are reviewed at length with the patient today.  Concerns regarding medicines are outlined above.  Medication changes, Labs and Tests ordered today are listed in the Patient Instructions below. Patient Instructions  Medication Instructions:  Your physician recommends that you continue on your current medications as directed. Please refer to the Current Medication list given to you today.  Labwork: NONE   Testing/Procedures: NONE   Follow-Up: Your physician recommends that you schedule a follow-up appointment in:   3 Months with Dr. Domenic Murphy. Will also be placing referral to the Structural Heart Team  Any Other Special Instructions Will Be Listed Below (If Applicable).  If you need a refill on your cardiac medications before your next appointment, please call your pharmacy.  Thank you for choosing [facility]!    Signed, Erma Heritage, PA-C  02/18/2018 7:54 PM    [facility] [address] [place], [place] [address] Phone: [phone]

## 2018-02-18 NOTE — Patient Instructions (Signed)
Medication Instructions:  Your physician recommends that you continue on your current medications as directed. Please refer to the Current Medication list given to you today.   Labwork: NONE  Testing/Procedures: NONE  Follow-Up: Your physician recommends that you schedule a follow-up appointment in: 3 Months with Dr. McDowell   Any Other Special Instructions Will Be Listed Below (If Applicable).     If you need a refill on your cardiac medications before your next appointment, please call your pharmacy. Thank you for choosing [facility]!    

## 2018-03-04 NOTE — H&P (View-Only) (Signed)
Valve Clinic Consult Note  Chief Complaint  Patient presents with  . New Patient (Initial Visit)    Severe aortic stenosis/TAVR discussion    History of Present Illness: 83 yo male with history of aortic stenosis, HTN, hyperlipidemia and borderline diabetes mellitus who is here today as a new consult for the evaluation of severe aortic stenosis. He has been followed for aortic stenosis for several years. Echocardiogram in 2017 showed normal LV systolic function with severe aortic stenosis. He was asymptomatic at that time. He has developed worsening dyspnea over the past few months. Echo was repeated on 01/26/18 and showed normal LV systolic function with YBOF=75-10%, grade 2 diastolic dysfunction. The aortic valve calcified with limited leaflet excursion. Mean gradient 58 mmHg, peak gradient 93 mmHg, AVA 0.55 cm2, DVI 0.15, consistent with severe aortic stenosis. He was recently seen in our [facility] cardiology office and noted reduced exercise tolerance, dyspnea with exertion and dizziness. He had played golf on a weekly basis up until last fall when he stopped due to dyspnea. No LE edema.   He goes to the dentist every 6 months. No active dental issues. He is retired from [employer]. He lives alone. His daughter lives nearby. Both daughters are here with him today.   Primary Care Physician: Asencion Noble, MD Primary Cardiologist: Rozann Lesches Referring Cardiologist: Rozann Lesches  Past Medical History:  Diagnosis Date  . Aortic stenosis    a. severe AS by echo in 2017 b. 01/2018: repeat echo showing EF of 55-60%, Grade 2 DD, and severe AS with mean gradient of 58 mm Hg  . Clavicular fracture   . Essential hypertension   . Hyperlipemia   . Hyperplastic colon polyp 2002   Dr. Arnoldo Morale  . Type 2 diabetes mellitus ([place])     Past Surgical History:  Procedure Laterality Date  . Cataract surgery Right   . COLONOSCOPY  08/17/2001   Small polyp measured 3 mm removed/multiple  middle scattered diverticula in the ascending colon-hyperplastic  . COLONOSCOPY  08/14/2011   Procedure: COLONOSCOPY;  Surgeon: Daneil Dolin, MD;  Location: [facility];  Service: Endoscopy;  Laterality: N/A;  10:00    Current Outpatient Medications  Medication Sig Dispense Refill  . aspirin EC 81 MG tablet Take 81 mg by mouth daily.      Marland Kitchen atorvastatin (LIPITOR) 10 MG tablet Take 10 mg by mouth daily.  4  . meclizine (ANTIVERT) 25 MG tablet Take 1 tablet (25 mg total) by mouth 4 (four) times daily. 2 tablet 0  . ramipril (ALTACE) 10 MG capsule Take 10 mg by mouth daily.      No current facility-administered medications for this visit.     No Known Allergies  Social History   Socioeconomic History  . Marital status: Widowed    Spouse name: Not on file  . Number of children: 2  . Years of education: Not on file  . Highest education level: Not on file  Occupational History  . Occupation: retired; [employer]  . Financial resource strain: Not on file  . Food insecurity:    Worry: Not on file    Inability: Not on file  . Transportation needs:    Medical: Not on file    Non-medical: Not on file  Tobacco Use  . Smoking status: Current Every Day Smoker    Packs/day: 0.50    Years: 65.00    Pack years: 32.50    Types: Cigarettes  .  Smokeless tobacco: Never Used  . Tobacco comment: quit about 4-5 yrs ago  Substance and Sexual Activity  . Alcohol use: No    Alcohol/week: 0.0 oz  . Drug use: No  . Sexual activity: Not Currently  Lifestyle  . Physical activity:    Days per week: Not on file    Minutes per session: Not on file  . Stress: Not on file  Relationships  . Social connections:    Talks on phone: Not on file    Gets together: Not on file    Attends religious service: Not on file    Active member of club or organization: Not on file    Attends meetings of clubs or organizations: Not on file    Relationship status: Not on file  . Intimate  partner violence:    Fear of current or ex partner: Not on file    Emotionally abused: Not on file    Physically abused: Not on file    Forced sexual activity: Not on file  Other Topics Concern  . Not on file  Social History Narrative   Widower (married for 20yrs); Remarried in 2016       Family History  Problem Relation Age of Onset  . Stroke Mother   . Heart attack Father   . Liver disease Sister   . Renal Disease Brother     Review of Systems:  As stated in the HPI and otherwise negative.   BP 118/68   Pulse 62   Ht 5\' 5"  (1.651 m)   Wt 152 lb (68.9 kg)   SpO2 93%   BMI 25.29 kg/m   Physical Examination: General: Well developed, well nourished, NAD  HEENT: OP clear, mucus membranes moist  SKIN: warm, dry. No rashes. Neuro: No focal deficits  Musculoskeletal: Muscle strength 5/5 all ext  Psychiatric: Mood and affect normal  Neck: No JVD, no carotid bruits, no thyromegaly, no lymphadenopathy.  Lungs:Clear bilaterally, no wheezes, rhonci, crackles Cardiovascular: Regular rate and rhythm. Loud, harsh systolic murmur.  Abdomen:Soft. Bowel sounds present. Non-tender.  Extremities: No lower extremity edema. Pulses are 2 + in the bilateral DP/PT.  Echo 01/26/18: - Left ventricle: The cavity size was normal. Wall thickness was   increased in a pattern of mild LVH. Systolic function was normal.   The estimated ejection fraction was in the range of 55% to 60%.   Wall motion was normal; there were no regional wall motion   abnormalities. Features are consistent with a pseudonormal left   ventricular filling pattern, with concomitant abnormal relaxation   and increased filling pressure (grade 2 diastolic dysfunction). - Aortic valve: Functionally bicuspid; moderately calcified   leaflets. There was severe stenosis. There was mild   regurgitation. Mean gradient (S): 58 mm Hg. Peak gradient (S): 93   mm Hg. VTI ratio of LVOT to aortic valve: 0.17. Valve area (VTI):   0.55  cm^2. Valve area (Vmax): 0.51 cm^2. Valve area (Vmean): 0.47   cm^2. - Mitral valve: Mildly calcified annulus. There was mild   regurgitation. - Left atrium: The atrium was mildly dilated. - Right atrium: Central venous pressure (est): 3 mm Hg. - Tricuspid valve: There was mild regurgitation. - Pulmonary arteries: Systolic pressure could not be accurately   estimated. - Pericardium, extracardiac: There was no pericardial effusion.  Impressions:  - Trileaflet but functionally bicuspid aortic valve, moderately   calcified. There is severe aortic stenosis that has further   progressed compared to the prior study  in 2017. Mild aortic   regurgitation.  Left ventricle:  The cavity size was normal. Wall thickness was increased in a pattern of mild LVH. Systolic function was normal. The estimated ejection fraction was in the range of 55% to 60%. Wall motion was normal; there were no regional wall motion abnormalities. Features are consistent with a pseudonormal left ventricular filling pattern, with concomitant abnormal relaxation and increased filling pressure (grade 2 diastolic dysfunction).  ------------------------------------------------------------------- Aortic valve:   Functionally bicuspid; moderately calcified leaflets.  Doppler:   There was severe stenosis.   There was mild regurgitation.    VTI ratio of LVOT to aortic valve: 0.17. Valve area (VTI): 0.55 cm^2. Indexed valve area (VTI): 0.3 cm^2/m^2. Peak velocity ratio of LVOT to aortic valve: 0.16. Valve area (Vmax): 0.51 cm^2. Indexed valve area (Vmax): 0.28 cm^2/m^2. Mean velocity ratio of LVOT to aortic valve: 0.15. Valve area (Vmean): 0.47 cm^2. Indexed valve area (Vmean): 0.26 cm^2/m^2.    Mean gradient (S): 58 mm Hg. Peak gradient (S): 93 mm Hg.  ------------------------------------------------------------------- Aorta:  Aortic root: The aortic root was normal in  size.  ------------------------------------------------------------------- Mitral valve:   Mildly calcified annulus.  Doppler:  There was mild regurgitation.    Peak gradient (D): 5 mm Hg.  ------------------------------------------------------------------- Left atrium:  The atrium was mildly dilated.  ------------------------------------------------------------------- Right ventricle:  The cavity size was normal. Systolic function was normal.  ------------------------------------------------------------------- Pulmonic valve:   Poorly visualized.  ------------------------------------------------------------------- Tricuspid valve:   The valve appears to be grossly normal. Doppler:  There was mild regurgitation.  ------------------------------------------------------------------- Pulmonary artery:    Systolic pressure could not be accurately estimated.  ------------------------------------------------------------------- Right atrium:  The atrium was normal in size.  ------------------------------------------------------------------- Pericardium:  There was no pericardial effusion.  ------------------------------------------------------------------- Systemic veins: Inferior vena cava: The vessel was normal in size. The respirophasic diameter changes were in the normal range (= 50%), consistent with normal central venous pressure.  ------------------------------------------------------------------- Measurements   Left ventricle                           Value          Reference  LV ID, ED, PLAX chordal                  44.4  mm       43 - 52  LV ID, ES, PLAX chordal                  26.3  mm       23 - 38  LV fx shortening, PLAX chordal           41    %        >=29  LV PW thickness, ED                      11.7  mm       ----------  IVS/LV PW ratio, ED                      1              <=1.3  Stroke volume, 2D                        69    ml       ----------   Stroke volume/bsa, 2D  38    ml/m^2   ----------  LV e&', lateral                           5.66  cm/s     ----------  LV E/e&', lateral                         19.26          ----------  LV e&', medial                            5     cm/s     ----------  LV E/e&', medial                          21.8           ----------  LV e&', average                           5.33  cm/s     ----------  LV E/e&', average                         20.45          ----------    Ventricular septum                       Value          Reference  IVS thickness, ED                        11.7  mm       ----------    LVOT                                     Value          Reference  LVOT ID, S                               20    mm       ----------  LVOT area                                3.14  cm^2     ----------  LVOT peak velocity, S                    78.6  cm/s     ----------  LVOT mean velocity, S                    54    cm/s     ----------  LVOT VTI, S                              22.1  cm       ----------  LVOT peak gradient, S                    2     mm Hg    ----------  Aortic valve                             Value          Reference  Aortic valve peak velocity, S            481   cm/s     ----------  Aortic valve mean velocity, S            362   cm/s     ----------  Aortic valve VTI, S                      127   cm       ----------  Aortic mean gradient, S                  58    mm Hg    ----------  Aortic peak gradient, S                  93    mm Hg    ----------  VTI ratio, LVOT/AV                       0.17           ----------  Aortic valve area, VTI                   0.55  cm^2     ----------  Aortic valve area/bsa, VTI               0.3   cm^2/m^2 ----------  Velocity ratio, peak, LVOT/AV            0.16           ----------  Aortic valve area, peak velocity         0.51  cm^2     ----------  Aortic valve area/bsa, peak              0.28  cm^2/m^2 ----------   velocity  Velocity ratio, mean, LVOT/AV            0.15           ----------  Aortic valve area, mean velocity         0.47  cm^2     ----------  Aortic valve area/bsa, mean              0.26  cm^2/m^2 ----------  velocity    Aorta                                    Value          Reference  Aortic root ID, ED                       37    mm       ----------    Left atrium                              Value          Reference  LA ID, A-P, ES  39    mm       ----------  LA ID/bsa, A-P                           2.13  cm/m^2   <=2.2  LA volume, S                             69.5  ml       ----------  LA volume/bsa, S                         37.9  ml/m^2   ----------  LA volume, ES, 1-p A4C                   51.7  ml       ----------  LA volume/bsa, ES, 1-p A4C               28.2  ml/m^2   ----------  LA volume, ES, 1-p A2C                   84.1  ml       ----------  LA volume/bsa, ES, 1-p A2C               45.9  ml/m^2   ----------    Mitral valve                             Value          Reference  Mitral E-wave peak velocity              109   cm/s     ----------  Mitral A-wave peak velocity              99.9  cm/s     ----------  Mitral deceleration time         (H)     278   ms       150 - 230  Mitral peak gradient, D                  5     mm Hg    ----------  Mitral E/A ratio, peak                   1.1            ----------    Right atrium                             Value          Reference  RA ID, S-I, ES, A4C              (H)     49.2  mm       34 - 49  RA area, ES, A4C                         15.3  cm^2     8.3 - 19.5  RA volume, ES, A/L                       39.3  ml       ----------  RA volume/bsa, ES,  A/L                   21.4  ml/m^2   ----------    Systemic veins                           Value          Reference  Estimated CVP                            3     mm Hg    ----------    Right ventricle                          Value           Reference  TAPSE                                    20.8  mm       ----------  RV s&', lateral, S                        10.3  cm/s     ----------  EKG:  EKG is ordered today. The ekg ordered today demonstrates NSR, rate 75 bpm. PAC.   Recent Labs: No results found for requested labs within last 8760 hours.   Lipid Panel No results found for: CHOL, TRIG, HDL, CHOLHDL, VLDL, LDLCALC, LDLDIRECT   Wt Readings from Last 3 Encounters:  03/05/18 152 lb (68.9 kg)  02/18/18 150 lb 3.2 oz (68.1 kg)  01/25/16 159 lb (72.1 kg)     Other studies Reviewed: Additional studies/ records that were reviewed today include: Echo images, EKG, office notes. Review of the above records demonstrates: Severe AS   Assessment and Plan:   1. Severe aortic stenosis: He has severe, stage D aortic valve stenosis. I have personally reviewed the echo images. The aortic valve is thickened, calcified with limited leaflet mobility. I think he would benefit from AVR. Given advanced age, he is not a good candidate for conventional AVR by surgical approach. I think he may be a good candidate for TAVR.   STS Risk Score: Procedure: Isolated AVR  Risk of Mortality: 1.782%  Renal Failure: 2.088%  Permanent Stroke: 1.090%  Prolonged Ventilation: 9.039%  DSW Infection: 0.137%  Reoperation: 2.983%  Morbidity or Mortality: 13.206%  Short Length of Stay: 34.517%  Long Length of Stay: 7.471%   I have reviewed the natural history of aortic stenosis with the patient and their family members  who are present today. We have discussed the limitations of medical therapy and the poor prognosis associated with symptomatic aortic stenosis. We have reviewed potential treatment options, including palliative medical therapy, conventional surgical aortic valve replacement, and transcatheter aortic valve replacement. We discussed treatment options in the context of the patient's specific comorbid medical conditions.   He would like to  proceed with planning for TAVR. I will arrange a right and left heart catheterization at [facility] 03/25/18 at 11:30 am. Risks and benefits of the cath procedure and the TAVR procedure are reviewed with the patient. After the cath, he will have a cardiac CT, CTA of the chest/abdomen and pelvis, PFTs, carotid dopplers, PT assessment and will then be referred to see one of the CT  surgeons on our TAVR team. Pre-cath labs today.    Current medicines are reviewed at length with the patient today.  The patient does not have concerns regarding medicines.  The following changes have been made:  no change  Labs/ tests ordered today include:  No orders of the defined types were placed in this encounter.    Disposition:   FU with the TAVR team as planned.    Signed, Lauree Chandler, MD 03/05/2018 10:35 AM    [facility] [place], [place], [place]  [address] Phone: [phone]; Fax: [phone]

## 2018-03-04 NOTE — Progress Notes (Signed)
Valve Clinic Consult Note  Chief Complaint  Patient presents with  . New Patient (Initial Visit)    Severe aortic stenosis/TAVR discussion    History of Present Illness: 83 yo male with history of aortic stenosis, HTN, hyperlipidemia and borderline diabetes mellitus who is here today as a new consult for the evaluation of severe aortic stenosis. He has been followed for aortic stenosis for several years. Echocardiogram in 2017 showed normal LV systolic function with severe aortic stenosis. He was asymptomatic at that time. He has developed worsening dyspnea over the past few months. Echo was repeated on 01/26/18 and showed normal LV systolic function with PYPP=50-93%, grade 2 diastolic dysfunction. The aortic valve calcified with limited leaflet excursion. Mean gradient 58 mmHg, peak gradient 93 mmHg, AVA 0.55 cm2, DVI 0.15, consistent with severe aortic stenosis. He was recently seen in our [facility] cardiology office and noted reduced exercise tolerance, dyspnea with exertion and dizziness. He had played golf on a weekly basis up until last fall when he stopped due to dyspnea. No LE edema.   He goes to the dentist every 6 months. No active dental issues. He is retired from [employer]. He lives alone. His daughter lives nearby. Both daughters are here with him today.   Primary Care Physician: Asencion Noble, MD Primary Cardiologist: Rozann Lesches Referring Cardiologist: Rozann Lesches  Past Medical History:  Diagnosis Date  . Aortic stenosis    a. severe AS by echo in 2017 b. 01/2018: repeat echo showing EF of 55-60%, Grade 2 DD, and severe AS with mean gradient of 58 mm Hg  . Clavicular fracture   . Essential hypertension   . Hyperlipemia   . Hyperplastic colon polyp 2002   Dr. Arnoldo Morale  . Type 2 diabetes mellitus ([place])     Past Surgical History:  Procedure Laterality Date  . Cataract surgery Right   . COLONOSCOPY  08/17/2001   Small polyp measured 3 mm removed/multiple  middle scattered diverticula in the ascending colon-hyperplastic  . COLONOSCOPY  08/14/2011   Procedure: COLONOSCOPY;  Surgeon: Daneil Dolin, MD;  Location: [facility];  Service: Endoscopy;  Laterality: N/A;  10:00    Current Outpatient Medications  Medication Sig Dispense Refill  . aspirin EC 81 MG tablet Take 81 mg by mouth daily.      Marland Kitchen atorvastatin (LIPITOR) 10 MG tablet Take 10 mg by mouth daily.  4  . meclizine (ANTIVERT) 25 MG tablet Take 1 tablet (25 mg total) by mouth 4 (four) times daily. 2 tablet 0  . ramipril (ALTACE) 10 MG capsule Take 10 mg by mouth daily.      No current facility-administered medications for this visit.     No Known Allergies  Social History   Socioeconomic History  . Marital status: Widowed    Spouse name: Not on file  . Number of children: 2  . Years of education: Not on file  . Highest education level: Not on file  Occupational History  . Occupation: retired; [employer]  . Financial resource strain: Not on file  . Food insecurity:    Worry: Not on file    Inability: Not on file  . Transportation needs:    Medical: Not on file    Non-medical: Not on file  Tobacco Use  . Smoking status: Current Every Day Smoker    Packs/day: 0.50    Years: 65.00    Pack years: 32.50    Types: Cigarettes  .  Smokeless tobacco: Never Used  . Tobacco comment: quit about 4-5 yrs ago  Substance and Sexual Activity  . Alcohol use: No    Alcohol/week: 0.0 oz  . Drug use: No  . Sexual activity: Not Currently  Lifestyle  . Physical activity:    Days per week: Not on file    Minutes per session: Not on file  . Stress: Not on file  Relationships  . Social connections:    Talks on phone: Not on file    Gets together: Not on file    Attends religious service: Not on file    Active member of club or organization: Not on file    Attends meetings of clubs or organizations: Not on file    Relationship status: Not on file  . Intimate  partner violence:    Fear of current or ex partner: Not on file    Emotionally abused: Not on file    Physically abused: Not on file    Forced sexual activity: Not on file  Other Topics Concern  . Not on file  Social History Narrative   Widower (married for 58yrs); Remarried in 2016       Family History  Problem Relation Age of Onset  . Stroke Mother   . Heart attack Father   . Liver disease Sister   . Renal Disease Brother     Review of Systems:  As stated in the HPI and otherwise negative.   BP 118/68   Pulse 62   Ht 5\' 5"  (1.651 m)   Wt 152 lb (68.9 kg)   SpO2 93%   BMI 25.29 kg/m   Physical Examination: General: Well developed, well nourished, NAD  HEENT: OP clear, mucus membranes moist  SKIN: warm, dry. No rashes. Neuro: No focal deficits  Musculoskeletal: Muscle strength 5/5 all ext  Psychiatric: Mood and affect normal  Neck: No JVD, no carotid bruits, no thyromegaly, no lymphadenopathy.  Lungs:Clear bilaterally, no wheezes, rhonci, crackles Cardiovascular: Regular rate and rhythm. Loud, harsh systolic murmur.  Abdomen:Soft. Bowel sounds present. Non-tender.  Extremities: No lower extremity edema. Pulses are 2 + in the bilateral DP/PT.  Echo 01/26/18: - Left ventricle: The cavity size was normal. Wall thickness was   increased in a pattern of mild LVH. Systolic function was normal.   The estimated ejection fraction was in the range of 55% to 60%.   Wall motion was normal; there were no regional wall motion   abnormalities. Features are consistent with a pseudonormal left   ventricular filling pattern, with concomitant abnormal relaxation   and increased filling pressure (grade 2 diastolic dysfunction). - Aortic valve: Functionally bicuspid; moderately calcified   leaflets. There was severe stenosis. There was mild   regurgitation. Mean gradient (S): 58 mm Hg. Peak gradient (S): 93   mm Hg. VTI ratio of LVOT to aortic valve: 0.17. Valve area (VTI):   0.55  cm^2. Valve area (Vmax): 0.51 cm^2. Valve area (Vmean): 0.47   cm^2. - Mitral valve: Mildly calcified annulus. There was mild   regurgitation. - Left atrium: The atrium was mildly dilated. - Right atrium: Central venous pressure (est): 3 mm Hg. - Tricuspid valve: There was mild regurgitation. - Pulmonary arteries: Systolic pressure could not be accurately   estimated. - Pericardium, extracardiac: There was no pericardial effusion.  Impressions:  - Trileaflet but functionally bicuspid aortic valve, moderately   calcified. There is severe aortic stenosis that has further   progressed compared to the prior study  in 2017. Mild aortic   regurgitation.  Left ventricle:  The cavity size was normal. Wall thickness was increased in a pattern of mild LVH. Systolic function was normal. The estimated ejection fraction was in the range of 55% to 60%. Wall motion was normal; there were no regional wall motion abnormalities. Features are consistent with a pseudonormal left ventricular filling pattern, with concomitant abnormal relaxation and increased filling pressure (grade 2 diastolic dysfunction).  ------------------------------------------------------------------- Aortic valve:   Functionally bicuspid; moderately calcified leaflets.  Doppler:   There was severe stenosis.   There was mild regurgitation.    VTI ratio of LVOT to aortic valve: 0.17. Valve area (VTI): 0.55 cm^2. Indexed valve area (VTI): 0.3 cm^2/m^2. Peak velocity ratio of LVOT to aortic valve: 0.16. Valve area (Vmax): 0.51 cm^2. Indexed valve area (Vmax): 0.28 cm^2/m^2. Mean velocity ratio of LVOT to aortic valve: 0.15. Valve area (Vmean): 0.47 cm^2. Indexed valve area (Vmean): 0.26 cm^2/m^2.    Mean gradient (S): 58 mm Hg. Peak gradient (S): 93 mm Hg.  ------------------------------------------------------------------- Aorta:  Aortic root: The aortic root was normal in  size.  ------------------------------------------------------------------- Mitral valve:   Mildly calcified annulus.  Doppler:  There was mild regurgitation.    Peak gradient (D): 5 mm Hg.  ------------------------------------------------------------------- Left atrium:  The atrium was mildly dilated.  ------------------------------------------------------------------- Right ventricle:  The cavity size was normal. Systolic function was normal.  ------------------------------------------------------------------- Pulmonic valve:   Poorly visualized.  ------------------------------------------------------------------- Tricuspid valve:   The valve appears to be grossly normal. Doppler:  There was mild regurgitation.  ------------------------------------------------------------------- Pulmonary artery:    Systolic pressure could not be accurately estimated.  ------------------------------------------------------------------- Right atrium:  The atrium was normal in size.  ------------------------------------------------------------------- Pericardium:  There was no pericardial effusion.  ------------------------------------------------------------------- Systemic veins: Inferior vena cava: The vessel was normal in size. The respirophasic diameter changes were in the normal range (= 50%), consistent with normal central venous pressure.  ------------------------------------------------------------------- Measurements   Left ventricle                           Value          Reference  LV ID, ED, PLAX chordal                  44.4  mm       43 - 52  LV ID, ES, PLAX chordal                  26.3  mm       23 - 38  LV fx shortening, PLAX chordal           41    %        >=29  LV PW thickness, ED                      11.7  mm       ----------  IVS/LV PW ratio, ED                      1              <=1.3  Stroke volume, 2D                        69    ml       ----------   Stroke volume/bsa, 2D  38    ml/m^2   ----------  LV e&', lateral                           5.66  cm/s     ----------  LV E/e&', lateral                         19.26          ----------  LV e&', medial                            5     cm/s     ----------  LV E/e&', medial                          21.8           ----------  LV e&', average                           5.33  cm/s     ----------  LV E/e&', average                         20.45          ----------    Ventricular septum                       Value          Reference  IVS thickness, ED                        11.7  mm       ----------    LVOT                                     Value          Reference  LVOT ID, S                               20    mm       ----------  LVOT area                                3.14  cm^2     ----------  LVOT peak velocity, S                    78.6  cm/s     ----------  LVOT mean velocity, S                    54    cm/s     ----------  LVOT VTI, S                              22.1  cm       ----------  LVOT peak gradient, S                    2     mm Hg    ----------  Aortic valve                             Value          Reference  Aortic valve peak velocity, S            481   cm/s     ----------  Aortic valve mean velocity, S            362   cm/s     ----------  Aortic valve VTI, S                      127   cm       ----------  Aortic mean gradient, S                  58    mm Hg    ----------  Aortic peak gradient, S                  93    mm Hg    ----------  VTI ratio, LVOT/AV                       0.17           ----------  Aortic valve area, VTI                   0.55  cm^2     ----------  Aortic valve area/bsa, VTI               0.3   cm^2/m^2 ----------  Velocity ratio, peak, LVOT/AV            0.16           ----------  Aortic valve area, peak velocity         0.51  cm^2     ----------  Aortic valve area/bsa, peak              0.28  cm^2/m^2 ----------   velocity  Velocity ratio, mean, LVOT/AV            0.15           ----------  Aortic valve area, mean velocity         0.47  cm^2     ----------  Aortic valve area/bsa, mean              0.26  cm^2/m^2 ----------  velocity    Aorta                                    Value          Reference  Aortic root ID, ED                       37    mm       ----------    Left atrium                              Value          Reference  LA ID, A-P, ES  39    mm       ----------  LA ID/bsa, A-P                           2.13  cm/m^2   <=2.2  LA volume, S                             69.5  ml       ----------  LA volume/bsa, S                         37.9  ml/m^2   ----------  LA volume, ES, 1-p A4C                   51.7  ml       ----------  LA volume/bsa, ES, 1-p A4C               28.2  ml/m^2   ----------  LA volume, ES, 1-p A2C                   84.1  ml       ----------  LA volume/bsa, ES, 1-p A2C               45.9  ml/m^2   ----------    Mitral valve                             Value          Reference  Mitral E-wave peak velocity              109   cm/s     ----------  Mitral A-wave peak velocity              99.9  cm/s     ----------  Mitral deceleration time         (H)     278   ms       150 - 230  Mitral peak gradient, D                  5     mm Hg    ----------  Mitral E/A ratio, peak                   1.1            ----------    Right atrium                             Value          Reference  RA ID, S-I, ES, A4C              (H)     49.2  mm       34 - 49  RA area, ES, A4C                         15.3  cm^2     8.3 - 19.5  RA volume, ES, A/L                       39.3  ml       ----------  RA volume/bsa, ES,  A/L                   21.4  ml/m^2   ----------    Systemic veins                           Value          Reference  Estimated CVP                            3     mm Hg    ----------    Right ventricle                          Value           Reference  TAPSE                                    20.8  mm       ----------  RV s&', lateral, S                        10.3  cm/s     ----------  EKG:  EKG is ordered today. The ekg ordered today demonstrates NSR, rate 75 bpm. PAC.   Recent Labs: No results found for requested labs within last 8760 hours.   Lipid Panel No results found for: CHOL, TRIG, HDL, CHOLHDL, VLDL, LDLCALC, LDLDIRECT   Wt Readings from Last 3 Encounters:  03/05/18 152 lb (68.9 kg)  02/18/18 150 lb 3.2 oz (68.1 kg)  01/25/16 159 lb (72.1 kg)     Other studies Reviewed: Additional studies/ records that were reviewed today include: Echo images, EKG, office notes. Review of the above records demonstrates: Severe AS   Assessment and Plan:   1. Severe aortic stenosis: He has severe, stage D aortic valve stenosis. I have personally reviewed the echo images. The aortic valve is thickened, calcified with limited leaflet mobility. I think he would benefit from AVR. Given advanced age, he is not a good candidate for conventional AVR by surgical approach. I think he may be a good candidate for TAVR.   STS Risk Score: Procedure: Isolated AVR  Risk of Mortality: 1.782%  Renal Failure: 2.088%  Permanent Stroke: 1.090%  Prolonged Ventilation: 9.039%  DSW Infection: 0.137%  Reoperation: 2.983%  Morbidity or Mortality: 13.206%  Short Length of Stay: 34.517%  Long Length of Stay: 7.471%   I have reviewed the natural history of aortic stenosis with the patient and their family members  who are present today. We have discussed the limitations of medical therapy and the poor prognosis associated with symptomatic aortic stenosis. We have reviewed potential treatment options, including palliative medical therapy, conventional surgical aortic valve replacement, and transcatheter aortic valve replacement. We discussed treatment options in the context of the patient's specific comorbid medical conditions.   He would like to  proceed with planning for TAVR. I will arrange a right and left heart catheterization at [facility] 03/25/18 at 11:30 am. Risks and benefits of the cath procedure and the TAVR procedure are reviewed with the patient. After the cath, he will have a cardiac CT, CTA of the chest/abdomen and pelvis, PFTs, carotid dopplers, PT assessment and will then be referred to see one of the CT  surgeons on our TAVR team. Pre-cath labs today.    Current medicines are reviewed at length with the patient today.  The patient does not have concerns regarding medicines.  The following changes have been made:  no change  Labs/ tests ordered today include:  No orders of the defined types were placed in this encounter.    Disposition:   FU with the TAVR team as planned.    Signed, Lauree Chandler, MD 03/05/2018 10:35 AM    [facility] [place], [place], [place]  [address] Phone: [phone]; Fax: [phone]

## 2018-03-05 ENCOUNTER — Encounter: Payer: Self-pay | Admitting: Cardiovascular Disease

## 2018-03-05 ENCOUNTER — Ambulatory Visit: Payer: Medicare Other | Admitting: Cardiovascular Disease

## 2018-03-05 VITALS — BP 118/68 | HR 62 | Ht 65.0 in | Wt 152.0 lb

## 2018-03-05 DIAGNOSIS — I350 Nonrheumatic aortic (valve) stenosis: Secondary | ICD-10-CM | POA: Diagnosis not present

## 2018-03-05 NOTE — Patient Instructions (Signed)
Medication Instructions:  Your physician recommends that you continue on your current medications as directed. Please refer to the Current Medication list given to you today.   Labwork: Lab work to be done today--BMP and CBC  Testing/Procedures: Your physician has requested that you have a cardiac catheterization. Cardiac catheterization is used to diagnose and/or treat various heart conditions. Doctors may recommend this procedure for a number of different reasons. The most common reason is to evaluate chest pain. Chest pain can be a symptom of coronary artery disease (CAD), and cardiac catheterization can show whether plaque is narrowing or blocking your heart's arteries. This procedure is also used to evaluate the valves, as well as measure the blood flow and oxygen levels in different parts of your heart. For further information please visit HugeFiesta[employer]tn. Please follow instruction sheet, as given.  Scheduled for July 31,2019  Follow-Up: To be arranged after catheterization.   Any Other Special Instructions Will Be Listed Below (If Applicable).    [facility] [address], [place] [address] [place] [address] Dept: [phone] Loc: [phone]  Travis Murphy  03/05/2018  You are scheduled for a Cardiac Catheterization on Wednesday, July 31 with Dr. Lauree Chandler.  1. Please arrive at the [facility] (Main Entrance A) at [facility]: [place], [place] [address] at 9:00 AM (two hours before your procedure to ensure your preparation). Free valet parking service is available.   Special note: Every effort is made to have your procedure done on time. Please understand that emergencies sometimes delay scheduled procedures.  2. Diet: No solid food after midnight prior to procedure. May have clear liquids until 5 AM day of procedure.   3. Labs: Lab work done in  office on July 11,2019  4. Medication instructions in preparation for your procedure:   On the morning of your procedure, take your Aspirin and any morning medicines NOT listed above.  You may use sips of water.  5. Plan for one night stay--bring personal belongings. 6. Bring a current list of your medications and current insurance cards. 7. You MUST have a responsible person to drive you home. 8. Someone MUST be with you the first 24 hours after you arrive home or your discharge will be delayed. 9. Please wear clothes that are easy to get on and off and wear slip-on shoes.  Thank you for allowing [place] to care for you!   -- [facility]    If you need a refill on your cardiac medications before your next appointment, please call your pharmacy.

## 2018-03-06 LAB — BASIC METABOLIC PANEL
BUN/Creatinine Ratio: 13 (ref 10–24)
BUN: 12 mg/dL (ref 8–27)
CHLORIDE: 105 mmol/L (ref 96–106)
CO2: 25 mmol/L (ref 20–29)
Calcium: 8.9 mg/dL (ref 8.6–10.2)
Creatinine, Ser: 0.91 mg/dL (ref 0.76–1.27)
GFR calc Af Amer: 91 mL/min/{1.73_m2} (ref >59)
GFR calc non Af Amer: 79 mL/min/{1.73_m2} (ref >59)
GLUCOSE: 119 mg/dL — AB (ref 65–99)
POTASSIUM: 4.1 mmol/L (ref 3.5–5.2)
Sodium: 143 mmol/L (ref 134–144)

## 2018-03-06 LAB — CBC
Hematocrit: 43.7 % (ref 37.5–51.0)
Hemoglobin: 14.9 g/dL (ref 13.0–17.7)
MCH: 32.4 pg (ref 26.6–33.0)
MCHC: 34.1 g/dL (ref 31.5–35.7)
MCV: 95 fL (ref 79–97)
PLATELETS: 162 10*3/uL (ref 150–450)
RBC: 4.6 x10E6/uL (ref 4.14–5.80)
RDW: 13.8 % (ref 12.3–15.4)
WBC: 6.1 10*3/uL (ref 3.4–10.8)

## 2018-03-16 ENCOUNTER — Other Ambulatory Visit: Payer: Self-pay

## 2018-03-16 DIAGNOSIS — I350 Nonrheumatic aortic (valve) stenosis: Secondary | ICD-10-CM

## 2018-03-16 DIAGNOSIS — R0609 Other forms of dyspnea: Principal | ICD-10-CM

## 2018-03-16 DIAGNOSIS — R42 Dizziness and giddiness: Secondary | ICD-10-CM

## 2018-03-23 ENCOUNTER — Telehealth: Payer: Self-pay | Admitting: *Deleted

## 2018-03-23 NOTE — Telephone Encounter (Signed)
error 

## 2018-03-23 NOTE — Telephone Encounter (Addendum)
Catheterization scheduled at [facility] for: Wednesday March 25, 2018 11:30 AM Verified arrival time and place: [place] Entrance A at: 9 AM  No solid food after midnight prior to cath, clear liquids until 5 AM day of procedure. Verified no known allergies. Verified no diabetes medications  AM meds can be  taken pre-cath with sip of water including: ASA 81 mg  Confirmed patient has responsible person to drive home post procedure and for 24 hours after you arrive home: yes  Discussed instructions with patient, he verbalized understanding, thanked me for call.

## 2018-03-25 ENCOUNTER — Encounter (HOSPITAL_COMMUNITY)
Admission: RE | Disposition: A | Discharge status: Home / Self Care | Payer: Self-pay | Source: Ambulatory Visit | Attending: Cardiovascular Disease

## 2018-03-25 ENCOUNTER — Encounter (HOSPITAL_COMMUNITY): Payer: Self-pay | Admitting: Cardiovascular Disease

## 2018-03-25 ENCOUNTER — Other Ambulatory Visit: Payer: Self-pay

## 2018-03-25 ENCOUNTER — Ambulatory Visit (HOSPITAL_COMMUNITY)
Admission: RE | Admit: 2018-03-25 | Discharge: 2018-03-25 | Disposition: A | Discharge status: Home / Self Care | Payer: Medicare Other | Source: Ambulatory Visit | Attending: Cardiovascular Disease | Admitting: Cardiovascular Disease

## 2018-03-25 DIAGNOSIS — F17210 Nicotine dependence, cigarettes, uncomplicated: Secondary | ICD-10-CM | POA: Diagnosis not present

## 2018-03-25 DIAGNOSIS — Z823 Family history of stroke: Secondary | ICD-10-CM | POA: Diagnosis not present

## 2018-03-25 DIAGNOSIS — I10 Essential (primary) hypertension: Secondary | ICD-10-CM | POA: Insufficient documentation

## 2018-03-25 DIAGNOSIS — Z79899 Other long term (current) drug therapy: Secondary | ICD-10-CM | POA: Insufficient documentation

## 2018-03-25 DIAGNOSIS — I2510 Atherosclerotic heart disease of native coronary artery without angina pectoris: Secondary | ICD-10-CM | POA: Diagnosis not present

## 2018-03-25 DIAGNOSIS — Z9889 Other specified postprocedural states: Secondary | ICD-10-CM | POA: Insufficient documentation

## 2018-03-25 DIAGNOSIS — E119 Type 2 diabetes mellitus without complications: Secondary | ICD-10-CM | POA: Diagnosis not present

## 2018-03-25 DIAGNOSIS — I350 Nonrheumatic aortic (valve) stenosis: Secondary | ICD-10-CM

## 2018-03-25 DIAGNOSIS — E785 Hyperlipidemia, unspecified: Secondary | ICD-10-CM | POA: Diagnosis not present

## 2018-03-25 DIAGNOSIS — Z7982 Long term (current) use of aspirin: Secondary | ICD-10-CM | POA: Insufficient documentation

## 2018-03-25 DIAGNOSIS — Z8249 Family history of ischemic heart disease and other diseases of the circulatory system: Secondary | ICD-10-CM | POA: Diagnosis not present

## 2018-03-25 DIAGNOSIS — Z86010 Personal history of colonic polyps: Secondary | ICD-10-CM | POA: Insufficient documentation

## 2018-03-25 HISTORY — PX: RIGHT/LEFT HEART CATH AND CORONARY ANGIOGRAPHY: CATH118266

## 2018-03-25 LAB — GLUCOSE, CAPILLARY: GLUCOSE-CAPILLARY: 118 mg/dL — AB (ref 70–99)

## 2018-03-25 LAB — POCT I-STAT 3, VENOUS BLOOD GAS (G3P V)
Acid-base deficit: 1 mmol/L (ref 0.0–2.0)
BICARBONATE: 25.2 mmol/L (ref 20.0–28.0)
O2 Saturation: 71 %
PH VEN: 7.324 (ref 7.250–7.430)
PO2 VEN: 41 mmHg (ref 32.0–45.0)
TCO2: 27 mmol/L (ref 22–32)
pCO2, Ven: 48.4 mmHg (ref 44.0–60.0)

## 2018-03-25 LAB — POCT I-STAT 3, ART BLOOD GAS (G3+)
ACID-BASE DEFICIT: 1 mmol/L (ref 0.0–2.0)
BICARBONATE: 24.4 mmol/L (ref 20.0–28.0)
O2 SAT: 97 %
PCO2 ART: 44 mmHg (ref 32.0–48.0)
PO2 ART: 98 mmHg (ref 83.0–108.0)
TCO2: 26 mmol/L (ref 22–32)
pH, Arterial: 7.352 (ref 7.350–7.450)

## 2018-03-25 SURGERY — RIGHT/LEFT HEART CATH AND CORONARY ANGIOGRAPHY
Anesthesia: LOCAL

## 2018-03-25 MED ORDER — VERAPAMIL HCL 2.5 MG/ML IV SOLN
INTRAVENOUS | Status: AC
  Filled 2018-03-25: qty 2

## 2018-03-25 MED ORDER — HEPARIN (PORCINE) IN NACL 1000-0.9 UT/500ML-% IV SOLN
INTRAVENOUS | Status: AC
  Filled 2018-03-25: qty 500

## 2018-03-25 MED ORDER — LIDOCAINE HCL (PF) 1 % IJ SOLN
INTRAMUSCULAR | Status: AC
  Filled 2018-03-25: qty 30

## 2018-03-25 MED ORDER — ACETAMINOPHEN 325 MG PO TABS: 650.0000 mg | ORAL_TABLET | ORAL | Status: DC | PRN

## 2018-03-25 MED ORDER — IOHEXOL 350 MG/ML SOLN
INTRAVENOUS | Status: DC | PRN
  Administered 2018-03-25: 70 mL via INTRA_ARTERIAL

## 2018-03-25 MED ORDER — SODIUM CHLORIDE 0.9% FLUSH: 3.0000 mL | Freq: Two times a day (BID) | INTRAVENOUS | Status: DC

## 2018-03-25 MED ORDER — ASPIRIN 81 MG PO CHEW: 81.0000 mg | CHEWABLE_TABLET | ORAL | Status: DC

## 2018-03-25 MED ORDER — SODIUM CHLORIDE 0.9 % IV SOLN: INTRAVENOUS | Status: AC

## 2018-03-25 MED ORDER — SODIUM CHLORIDE 0.9 % IV SOLN: 250.0000 mL | INTRAVENOUS | Status: DC | PRN

## 2018-03-25 MED ORDER — HEPARIN (PORCINE) IN NACL 2-0.9 UNITS/ML
INTRAMUSCULAR | Status: DC | PRN
  Administered 2018-03-25: 10 mL via INTRA_ARTERIAL

## 2018-03-25 MED ORDER — HEPARIN (PORCINE) IN NACL 1000-0.9 UT/500ML-% IV SOLN
INTRAVENOUS | Status: DC | PRN
  Administered 2018-03-25 (×2): 500 mL

## 2018-03-25 MED ORDER — MIDAZOLAM HCL 2 MG/2ML IJ SOLN
INTRAMUSCULAR | Status: AC
  Filled 2018-03-25: qty 2

## 2018-03-25 MED ORDER — HEPARIN SODIUM (PORCINE) 1000 UNIT/ML IJ SOLN
INTRAMUSCULAR | Status: DC | PRN
  Administered 2018-03-25: 3500 [IU] via INTRAVENOUS

## 2018-03-25 MED ORDER — FENTANYL CITRATE (PF) 100 MCG/2ML IJ SOLN
INTRAMUSCULAR | Status: DC | PRN
  Administered 2018-03-25: 25 ug via INTRAVENOUS

## 2018-03-25 MED ORDER — SODIUM CHLORIDE 0.9% FLUSH: 3.0000 mL | INTRAVENOUS | Status: DC | PRN

## 2018-03-25 MED ORDER — LIDOCAINE HCL (PF) 1 % IJ SOLN
INTRAMUSCULAR | Status: DC | PRN
  Administered 2018-03-25 (×2): 2 mL

## 2018-03-25 MED ORDER — HEPARIN SODIUM (PORCINE) 1000 UNIT/ML IJ SOLN
INTRAMUSCULAR | Status: AC
  Filled 2018-03-25: qty 1

## 2018-03-25 MED ORDER — SODIUM CHLORIDE 0.9 % IV SOLN
INTRAVENOUS | Status: AC
  Administered 2018-03-25: 09:00:00 via INTRAVENOUS

## 2018-03-25 MED ORDER — MIDAZOLAM HCL 2 MG/2ML IJ SOLN
INTRAMUSCULAR | Status: DC | PRN
  Administered 2018-03-25: 1 mg via INTRAVENOUS

## 2018-03-25 MED ORDER — ONDANSETRON HCL 4 MG/2ML IJ SOLN: 4.0000 mg | Freq: Four times a day (QID) | INTRAMUSCULAR | Status: DC | PRN

## 2018-03-25 MED ORDER — FENTANYL CITRATE (PF) 100 MCG/2ML IJ SOLN
INTRAMUSCULAR | Status: AC
  Filled 2018-03-25: qty 2

## 2018-03-25 SURGICAL SUPPLY — 16 items
CATH 5FR JL3.5 JR4 ANG PIG MP (CATHETERS) ×1 IMPLANT
CATH BALLN WEDGE 5F 110CM (CATHETERS) ×1 IMPLANT
CATH INFINITI 5 FR AL2 (CATHETERS) ×1 IMPLANT
CATH INFINITI 5FR AL1 (CATHETERS) ×1 IMPLANT
DEVICE RAD COMP TR BAND LRG (VASCULAR PRODUCTS) ×1 IMPLANT
GLIDESHEATH SLEND SS 6F .021 (SHEATH) ×1 IMPLANT
GUIDEWIRE .025 260CM (WIRE) ×1 IMPLANT
GUIDEWIRE INQWIRE 1.5J.035X260 (WIRE) IMPLANT
INQWIRE 1.5J .035X260CM (WIRE) ×2
KIT HEART LEFT (KITS) ×2 IMPLANT
PACK CARDIAC CATHETERIZATION (CUSTOM PROCEDURE TRAY) ×2 IMPLANT
SHEATH GLIDE SLENDER 4/5FR (SHEATH) ×1 IMPLANT
TRANSDUCER W/STOPCOCK (MISCELLANEOUS) ×2 IMPLANT
TUBING CIL FLEX 10 FLL-RA (TUBING) ×2 IMPLANT
WIRE EMERALD ST .035X150CM (WIRE) ×1 IMPLANT
WIRE HI TORQ VERSACORE-J 145CM (WIRE) ×1 IMPLANT

## 2018-03-25 NOTE — Interval H&P Note (Signed)
History and Physical Interval Note:  03/25/2018 10:03 AM  Travis Murphy  has presented today for cardiac cath  with the diagnosis of severe aortic stenosis  The various methods of treatment have been discussed with the patient and family. After consideration of risks, benefits and other options for treatment, the patient has consented to  Procedure(s): RIGHT/LEFT HEART CATH AND CORONARY ANGIOGRAPHY (N/A) as a surgical intervention .  The patient's history has been reviewed, patient examined, no change in status, stable for surgery.  I have reviewed the patient's chart and labs.  Questions were answered to the patient's satisfaction.    Cath Lab Visit (complete for each Cath Lab visit)  Clinical Evaluation Leading to the Procedure:   ACS: No.  Non-ACS:    Anginal Classification: CCS II  Anti-ischemic medical therapy: No Therapy  Non-Invasive Test Results: No non-invasive testing performed  Prior CABG: No previous CABG         Lauree Chandler

## 2018-03-25 NOTE — Discharge Instructions (Signed)
**Note -identified via Obfuscation** Radial Site Care °Refer to this sheet in the next few weeks. These instructions provide you with information about caring for yourself after your procedure. Your health care provider may also give you more specific instructions. Your treatment has been planned according to current medical practices, but problems sometimes occur. Call your health care provider if you have any problems or questions after your procedure. °What can I expect after the procedure? °After your procedure, it is typical to have the following: °· Bruising at the radial site that usually fades within 1-2 weeks. °· Blood collecting in the tissue (hematoma) that may be painful to the touch. It should usually decrease in size and tenderness within 1-2 weeks. ° °Follow these instructions at home: °· Take medicines only as directed by your health care provider. °· You may shower 24-48 hours after the procedure or as directed by your health care provider. Remove the bandage (dressing) and gently wash the site with plain soap and water. Pat the area dry with a clean towel. Do not rub the site, because this may cause bleeding. °· Do not take baths, swim, or use a hot tub until your health care provider approves. °· Check your insertion site every day for redness, swelling, or drainage. °· Do not apply powder or lotion to the site. °· Do not flex or bend the affected arm for 24 hours or as directed by your health care provider. °· Do not push or pull heavy objects with the affected arm for 24 hours or as directed by your health care provider. °· Do not lift over 10 lb (4.5 kg) for 5 days after your procedure or as directed by your health care provider. °· Ask your health care provider when it is okay to: °? Return to work or school. °? Resume usual physical activities or sports. °? Resume sexual activity. °· Do not drive home if you are discharged the same day as the procedure. Have someone else drive you. °· You may drive 24 hours after the procedure  unless otherwise instructed by your health care provider. °· Do not operate machinery or power tools for 24 hours after the procedure. °· If your procedure was done as an outpatient procedure, which means that you went home the same day as your procedure, a responsible adult should be with you for the first 24 hours after you arrive home. °· Keep all follow-up visits as directed by your health care provider. This is important. °Contact a health care provider if: °· You have a fever. °· You have chills. °· You have increased bleeding from the radial site. Hold pressure on the site. °Get help right away if: °· You have unusual pain at the radial site. °· You have redness, warmth, or swelling at the radial site. °· You have drainage (other than a small amount of blood on the dressing) from the radial site. °· The radial site is bleeding, and the bleeding does not stop after 30 minutes of holding steady pressure on the site. °· Your arm or hand becomes pale, cool, tingly, or numb. °This information is not intended to replace advice given to you by your health care provider. Make sure you discuss any questions you have with your health care provider. °Document Released: 09/14/2010 Document Revised: 01/18/2016 Document Reviewed: 02/28/2014 °Elsevier Interactive Patient Education © 2018 [employer] ° °

## 2018-03-27 ENCOUNTER — Encounter (HOSPITAL_COMMUNITY): Payer: Medicare Other

## 2018-03-27 ENCOUNTER — Ambulatory Visit (HOSPITAL_COMMUNITY): Payer: Medicare Other

## 2018-03-31 ENCOUNTER — Ambulatory Visit: Payer: Medicare Other | Admitting: Physical Therapy

## 2018-04-06 ENCOUNTER — Ambulatory Visit (HOSPITAL_COMMUNITY)
Admission: RE | Admit: 2018-04-06 | Discharge: 2018-04-06 | Disposition: A | Discharge status: Home / Self Care | Payer: Medicare Other | Source: Ambulatory Visit | Attending: Cardiovascular Disease | Admitting: Cardiovascular Disease

## 2018-04-06 ENCOUNTER — Ambulatory Visit (HOSPITAL_BASED_OUTPATIENT_CLINIC_OR_DEPARTMENT_OTHER)
Admission: RE | Admit: 2018-04-06 | Discharge: 2018-04-06 | Disposition: A | Discharge status: Home / Self Care | Payer: Medicare Other | Source: Ambulatory Visit | Attending: Cardiovascular Disease | Admitting: Cardiovascular Disease

## 2018-04-06 DIAGNOSIS — I6523 Occlusion and stenosis of bilateral carotid arteries: Secondary | ICD-10-CM | POA: Diagnosis not present

## 2018-04-06 DIAGNOSIS — I350 Nonrheumatic aortic (valve) stenosis: Secondary | ICD-10-CM

## 2018-04-06 DIAGNOSIS — I10 Essential (primary) hypertension: Secondary | ICD-10-CM | POA: Insufficient documentation

## 2018-04-06 DIAGNOSIS — R0609 Other forms of dyspnea: Principal | ICD-10-CM

## 2018-04-06 DIAGNOSIS — J45909 Unspecified asthma, uncomplicated: Secondary | ICD-10-CM | POA: Diagnosis not present

## 2018-04-06 DIAGNOSIS — I712 Thoracic aortic aneurysm, without rupture: Secondary | ICD-10-CM | POA: Insufficient documentation

## 2018-04-06 DIAGNOSIS — R42 Dizziness and giddiness: Secondary | ICD-10-CM | POA: Diagnosis not present

## 2018-04-06 DIAGNOSIS — I714 Abdominal aortic aneurysm, without rupture: Secondary | ICD-10-CM | POA: Diagnosis not present

## 2018-04-06 DIAGNOSIS — R911 Solitary pulmonary nodule: Secondary | ICD-10-CM | POA: Insufficient documentation

## 2018-04-06 DIAGNOSIS — E119 Type 2 diabetes mellitus without complications: Secondary | ICD-10-CM | POA: Diagnosis not present

## 2018-04-06 DIAGNOSIS — E785 Hyperlipidemia, unspecified: Secondary | ICD-10-CM | POA: Insufficient documentation

## 2018-04-06 LAB — PULMONARY FUNCTION TEST
DL/VA % pred: 62 %
DL/VA: 2.7 ml/min/mmHg/L
DLCO UNC % PRED: 34 %
DLCO unc: 9.37 ml/min/mmHg
FEF 25-75 PRE: 0.73 L/s
FEF 25-75 Post: 0.83 L/sec
FEF2575-%Change-Post: 13 %
FEF2575-%PRED-PRE: 46 %
FEF2575-%Pred-Post: 53 %
FEV1-%Change-Post: 3 %
FEV1-%PRED-POST: 66 %
FEV1-%Pred-Pre: 64 %
FEV1-POST: 1.55 L
FEV1-PRE: 1.5 L
FEV1FVC-%Change-Post: 1 %
FEV1FVC-%Pred-Pre: 81 %
FEV6-%CHANGE-POST: 0 %
FEV6-%PRED-POST: 82 %
FEV6-%PRED-PRE: 82 %
FEV6-POST: 2.54 L
FEV6-Pre: 2.52 L
FEV6FVC-%Change-Post: -1 %
FEV6FVC-%Pred-Post: 104 %
FEV6FVC-%Pred-Pre: 105 %
FVC-%Change-Post: 1 %
FVC-%Pred-Post: 79 %
FVC-%Pred-Pre: 78 %
FVC-Post: 2.64 L
FVC-Pre: 2.59 L
POST FEV6/FVC RATIO: 96 %
PRE FEV1/FVC RATIO: 58 %
Post FEV1/FVC ratio: 59 %
Pre FEV6/FVC Ratio: 97 %
RV % pred: 121 %
RV: 2.98 L
TLC % PRED: 87 %
TLC: 5.44 L

## 2018-04-06 MED ORDER — ALBUTEROL SULFATE (2.5 MG/3ML) 0.083% IN NEBU
2.5000 mg | INHALATION_SOLUTION | Freq: Once | RESPIRATORY_TRACT | Status: AC
  Administered 2018-04-06: 2.5 mg via RESPIRATORY_TRACT

## 2018-04-06 MED ORDER — IOPAMIDOL (ISOVUE-370) INJECTION 76%
100.0000 mL | Freq: Once | INTRAVENOUS | Status: AC | PRN
  Administered 2018-04-06: 100 mL via INTRAVENOUS

## 2018-04-06 NOTE — Progress Notes (Signed)
Patient will receive no medications for CT Heart exam. CT to D/c.

## 2018-04-06 NOTE — Progress Notes (Signed)
*  Preliminary Results* Carotid artery duplex has been completed. Bilateral internal carotid arteries are 1-39%.  Vertebral arteries are patent with antegrade flow.  04/06/2018 1:15 PM  Abram Sander

## 2018-04-10 ENCOUNTER — Encounter: Payer: Self-pay | Admitting: Physician Assistant

## 2018-04-13 ENCOUNTER — Encounter: Payer: Self-pay | Admitting: Thoracic Surgery (Cardiothoracic Vascular Surgery)

## 2018-04-13 ENCOUNTER — Institutional Professional Consult (permissible substitution): Payer: Medicare Other | Admitting: Thoracic Surgery (Cardiothoracic Vascular Surgery)

## 2018-04-13 ENCOUNTER — Other Ambulatory Visit: Payer: Self-pay

## 2018-04-13 ENCOUNTER — Encounter: Payer: Self-pay | Admitting: Physical Therapy

## 2018-04-13 ENCOUNTER — Ambulatory Visit: Payer: Medicare Other | Attending: Cardiovascular Disease | Admitting: Physical Therapy

## 2018-04-13 VITALS — BP 117/65 | HR 69 | Resp 16 | Ht 65.0 in | Wt 150.0 lb

## 2018-04-13 DIAGNOSIS — I350 Nonrheumatic aortic (valve) stenosis: Secondary | ICD-10-CM | POA: Diagnosis not present

## 2018-04-13 DIAGNOSIS — Z720 Tobacco use: Secondary | ICD-10-CM | POA: Diagnosis not present

## 2018-04-13 DIAGNOSIS — R911 Solitary pulmonary nodule: Secondary | ICD-10-CM | POA: Diagnosis not present

## 2018-04-13 DIAGNOSIS — R2689 Other abnormalities of gait and mobility: Secondary | ICD-10-CM | POA: Diagnosis not present

## 2018-04-13 DIAGNOSIS — F17210 Nicotine dependence, cigarettes, uncomplicated: Secondary | ICD-10-CM | POA: Insufficient documentation

## 2018-04-13 NOTE — Patient Instructions (Addendum)
   Continue taking all current medications without change through the day before surgery.  Have nothing to eat or drink after midnight the night before surgery.  On the morning of surgery do not take any medications  Stop smoking immediately and permanently.

## 2018-04-13 NOTE — H&P (View-Only) (Signed)
[facility]ERY CONSULTATION REPORT  Referring Provider is Satira Sark, MD PCP is Asencion Noble, MD  Chief Complaint  Patient presents with  . Aortic Stenosis    eval for TAVR    HPI:  Patient is an 83 year old male with history of aortic stenosis, hypertension, hyperlipidemia, and long-standing tobacco abuse who has been referred for surgical consultation to discuss treatment options for management of severe symptomatic aortic stenosis.  The patient states that he has had a heart murmur for many years.  He has been followed carefully initially by Dr. Willey Blade and more recently by Dr. Domenic Polite.  Echocardiograms have demonstrated the presence of aortic stenosis that has gradually progressed in severity.  Left ventricular function has remained normal.  Patient claims to be asymptomatic until fairly recently.  The patient now admits to gradual decline in his exercise tolerance with worsening fatigue and exertional shortness of breath.  Follow-up echocardiogram performed further progression of his aortic stenosis with peak velocity across the aortic valve measured 4.8 m/s corresponding to mean transvalvular gradient estimated 58 mmHg.  The DVI was quite low at 0.16 and aortic valve area calculated 0.51 cm.  The patient was referred to the multidisciplinary heart valve clinic and has been evaluated previously by Dr. Angelena Form.  Diagnostic cardiac catheterization performed March 25, 2018 confirmed the presence of severe aortic stenosis with peak to peak and mean transvalvular gradients measured 43 and 38 mmHg respectively, corresponding to aortic valve area calculated 0.70 cm.  Patient was noted to have moderate nonobstructive three-vessel coronary artery disease.  Right heart pressures were normal.  The patient was referred for surgical consultation and has subsequently undergone CT angiography.  He presents to our office today  for routine consultation and is accompanied by his 2 daughters.  Patient is widowed but remarried.  He lives alone in [place], [place].  He previously worked for more than 30 years for [employer] but he has been retired since 1993.  He remains reasonably active physically and entirely functionally independent.  He enjoys working around the house.  He previously played tennis for many years although he stopped playing tennis approximately 15 years ago.  He drives a car.  He admits to a gradual decline in his exercise tolerance with decreased energy and some degree of exertional shortness of breath.  He denies any history of resting shortness of breath, PND, orthopnea, or lower extremity edema.  He has not had any exertional chest pain or chest tightness.  He reports occasional dizzy spells without syncope.  He admits to mild memory loss that has been slowly progressive.  Past Medical History:  Diagnosis Date  . Clavicular fracture   . Essential hypertension   . Hyperlipemia   . Hyperplastic colon polyp 2002   Dr. Arnoldo Morale  . Pulmonary nodule    a. right lower lobe. Seen on pre TAVR CT scan and will need follow up  . Severe aortic stenosis    a. severe AS by echo in 2017 b. 01/2018: repeat echo showing EF of 55-60%, Grade 2 DD, and severe AS with mean gradient of 58 mm Hg  . Type 2 diabetes mellitus ([place])     Past Surgical History:  Procedure Laterality Date  . Cataract surgery Right   . COLONOSCOPY  08/17/2001   Small polyp measured 3 mm removed/multiple middle scattered diverticula in the ascending colon-hyperplastic  . COLONOSCOPY  08/14/2011   Procedure: COLONOSCOPY;  Surgeon: Cristopher Estimable  Rourk, MD;  Location: [facility];  Service: Endoscopy;  Laterality: N/A;  10:00  . RIGHT/LEFT HEART CATH AND CORONARY ANGIOGRAPHY N/A 03/25/2018   Procedure: RIGHT/LEFT HEART CATH AND CORONARY ANGIOGRAPHY;  Surgeon: Burnell Blanks, MD;  Location: [facility];  Service: Cardiovascular;   Laterality: N/A;    Family History  Problem Relation Age of Onset  . Stroke Mother   . Heart attack Father   . Liver disease Sister   . Renal Disease Brother     Social History   Socioeconomic History  . Marital status: Widowed    Spouse name: Not on file  . Number of children: 2  . Years of education: Not on file  . Highest education level: Not on file  Occupational History  . Occupation: retired; [employer]  . Financial resource strain: Not on file  . Food insecurity:    Worry: Not on file    Inability: Not on file  . Transportation needs:    Medical: Not on file    Non-medical: Not on file  Tobacco Use  . Smoking status: Former Smoker    Packs/day: 0.50    Years: 65.00    Pack years: 32.50    Types: Cigarettes  . Smokeless tobacco: Never Used  . Tobacco comment: quit about 4-5 yrs ago  Substance and Sexual Activity  . Alcohol use: No    Alcohol/week: 0.0 standard drinks  . Drug use: No  . Sexual activity: Not Currently  Lifestyle  . Physical activity:    Days per week: Not on file    Minutes per session: Not on file  . Stress: Not on file  Relationships  . Social connections:    Talks on phone: Not on file    Gets together: Not on file    Attends religious service: Not on file    Active member of club or organization: Not on file    Attends meetings of clubs or organizations: Not on file    Relationship status: Not on file  . Intimate partner violence:    Fear of current or ex partner: Not on file    Emotionally abused: Not on file    Physically abused: Not on file    Forced sexual activity: Not on file  Other Topics Concern  . Not on file  Social History Narrative   Widower (married for 59yrs); Remarried in 2016       Current Outpatient Medications  Medication Sig Dispense Refill  . aspirin EC 81 MG tablet Take 81 mg by mouth daily.      Marland Kitchen atorvastatin (LIPITOR) 10 MG tablet Take 10 mg by mouth daily.  4  . meclizine (ANTIVERT)  25 MG tablet Take 1 tablet (25 mg total) by mouth 4 (four) times daily. (Patient taking differently: Take 25 mg by mouth 3 (three) times daily as needed for dizziness. ) 2 tablet 0  . ramipril (ALTACE) 10 MG capsule Take 10 mg by mouth daily.      No current facility-administered medications for this visit.     No Known Allergies    Review of Systems:   General:  normal appetite, decreased energy, no weight gain, no weight loss, no fever  Cardiac:  no chest pain with exertion, no chest pain at rest, + SOB with exertion, no resting SOB, no PND, no orthopnea, no palpitations, no arrhythmia, no atrial fibrillation, no LE edema, + dizzy spells, no syncope  Respiratory:  +  exertional shortness of breath, no home oxygen, no productive cough, + dry cough, no bronchitis, no wheezing, no hemoptysis, no asthma, no pain with inspiration or cough, no sleep apnea, no CPAP at night  GI:   no difficulty swallowing, no reflux, no frequent heartburn, no hiatal hernia, no abdominal pain, no constipation, no diarrhea, no hematochezia, no hematemesis, no melena  GU:   no dysuria,  no frequency, no urinary tract infection, no hematuria, no enlarged prostate, no kidney stones, no kidney disease  Vascular:  no pain suggestive of claudication, no pain in feet, no leg cramps, no varicose veins, no DVT, no non-healing foot ulcer  Neuro:   no stroke, no TIA's, no seizures, no headaches, no temporary blindness one eye,  no slurred speech, no peripheral neuropathy, no chronic pain, mild instability of gait, mild memory/cognitive dysfunction  Musculoskeletal: no arthritis, no joint swelling, no myalgias, no difficulty walking, normal mobility   Skin:   no rash, no itching, no skin infections, no pressure sores or ulcerations  Psych:   no anxiety, no depression, no nervousness, no unusual recent stress  Eyes:   no blurry vision, no floaters, no recent vision changes, + wears glasses or contacts  ENT:   no hearing loss, no  loose or painful teeth, no dentures, last saw dentist February 2019  Hematologic:  no easy bruising, no abnormal bleeding, no clotting disorder, no frequent epistaxis  Endocrine:  no diabetes, does not check CBG's at home           Physical Exam:   BP 117/65 (BP Location: Right Arm, Patient Position: Sitting, Cuff Size: Large)   Pulse 69   Resp 16   Ht 5\' 5"  (1.651 m)   Wt 150 lb (68 kg)   SpO2 94% Comment: ON RA  BMI 24.96 kg/m   General:  Thin, elderly,  well-appearing  HEENT:  Unremarkable   Neck:   no JVD, no bruits, no adenopathy   Chest:   clear to auscultation, symmetrical breath sounds, no wheezes, no rhonchi   CV:   RRR, grade IV/VI crescendo/decrescendo murmur heard best at LUSB,  no diastolic murmur  Abdomen:  soft, non-tender, no masses   Extremities:  warm, well-perfused, pulses palpable, no LE edema  Rectal/GU  Deferred  Neuro:   Grossly non-focal and symmetrical throughout  Skin:   Clean and dry, no rashes, no breakdown   Diagnostic Tests:  Transthoracic Echocardiography  (Report amended )  Patient:    Uziel, Covault MR #:       [id number] Study Date: 01/26/2018 Gender:     M Age:        8 Height:     165.1 cm Weight:     72.1 kg BSA:        1.83 m^2 Pt. Status: Room:   ATTENDING    Asencion Noble [id number]  [id number]     WNUUV, OZD [id number]  Luster Landsberg [id number]  SONOGRAPHER  Leavy Cella  PERFORMING   [facility], Forestine Na  cc:  ------------------------------------------------------------------- LV EF: 55% -   60%  ------------------------------------------------------------------- Indications:      Aortic stenosis 424.1.  ------------------------------------------------------------------- History:   PMH:  Former SMoker, Aortic Stenosis.  ------------------------------------------------------------------- Study Conclusions  - Left ventricle: The cavity size was normal. Wall thickness was   increased in a pattern of mild  LVH. Systolic function was normal.   The estimated ejection fraction was in the range of 55% to 60%.  Wall motion was normal; there were no regional wall motion   abnormalities. Features are consistent with a pseudonormal left   ventricular filling pattern, with concomitant abnormal relaxation   and increased filling pressure (grade 2 diastolic dysfunction). - Aortic valve: Functionally bicuspid; moderately calcified   leaflets. There was severe stenosis. There was mild   regurgitation. Mean gradient (S): 58 mm Hg. Peak gradient (S): 93   mm Hg. VTI ratio of LVOT to aortic valve: 0.17. Valve area (VTI):   0.55 cm^2. Valve area (Vmax): 0.51 cm^2. Valve area (Vmean): 0.47   cm^2. - Mitral valve: Mildly calcified annulus. There was mild   regurgitation. - Left atrium: The atrium was mildly dilated. - Right atrium: Central venous pressure (est): 3 mm Hg. - Tricuspid valve: There was mild regurgitation. - Pulmonary arteries: Systolic pressure could not be accurately   estimated. - Pericardium, extracardiac: There was no pericardial effusion.  Impressions:  - Trileaflet but functionally bicuspid aortic valve, moderately   calcified. There is severe aortic stenosis that has further   progressed compared to the prior study in 2017. Mild aortic   regurgitation.  ------------------------------------------------------------------- Study data:  Comparison was made to the study of 12/25/2015.  Study status:  Routine.  Procedure:  Transthoracic echocardiography. Image quality was adequate.  Study completion:  There were no complications.          Transthoracic echocardiography.  M-mode, complete 2D, spectral Doppler, and color Doppler.  Birthdate: Patient birthdate: 02-27-1935.  Age:  Patient is 83 yr old.  Sex: Gender: male.    BMI: 26.5 kg/m^2.  Blood pressure:     128/67 Patient status:  Outpatient.  Study date:  Study date: 01/26/2018. Study time: 08:55 AM.  Location:  Echo  laboratory.  -------------------------------------------------------------------  ------------------------------------------------------------------- Left ventricle:  The cavity size was normal. Wall thickness was increased in a pattern of mild LVH. Systolic function was normal. The estimated ejection fraction was in the range of 55% to 60%. Wall motion was normal; there were no regional wall motion abnormalities. Features are consistent with a pseudonormal left ventricular filling pattern, with concomitant abnormal relaxation and increased filling pressure (grade 2 diastolic dysfunction).  ------------------------------------------------------------------- Aortic valve:   Functionally bicuspid; moderately calcified leaflets.  Doppler:   There was severe stenosis.   There was mild regurgitation.    VTI ratio of LVOT to aortic valve: 0.17. Valve area (VTI): 0.55 cm^2. Indexed valve area (VTI): 0.3 cm^2/m^2. Peak velocity ratio of LVOT to aortic valve: 0.16. Valve area (Vmax): 0.51 cm^2. Indexed valve area (Vmax): 0.28 cm^2/m^2. Mean velocity ratio of LVOT to aortic valve: 0.15. Valve area (Vmean): 0.47 cm^2. Indexed valve area (Vmean): 0.26 cm^2/m^2.    Mean gradient (S): 58 mm Hg. Peak gradient (S): 93 mm Hg.  ------------------------------------------------------------------- Aorta:  Aortic root: The aortic root was normal in size.  ------------------------------------------------------------------- Mitral valve:   Mildly calcified annulus.  Doppler:  There was mild regurgitation.    Peak gradient (D): 5 mm Hg.  ------------------------------------------------------------------- Left atrium:  The atrium was mildly dilated.  ------------------------------------------------------------------- Right ventricle:  The cavity size was normal. Systolic function was normal.  ------------------------------------------------------------------- Pulmonic valve:   Poorly  visualized.  ------------------------------------------------------------------- Tricuspid valve:   The valve appears to be grossly normal. Doppler:  There was mild regurgitation.  ------------------------------------------------------------------- Pulmonary artery:    Systolic pressure could not be accurately estimated.  ------------------------------------------------------------------- Right atrium:  The atrium was normal in size.  ------------------------------------------------------------------- Pericardium:  There was no pericardial effusion.  -------------------------------------------------------------------  Systemic veins: Inferior vena cava: The vessel was normal in size. The respirophasic diameter changes were in the normal range (= 50%), consistent with normal central venous pressure.  ------------------------------------------------------------------- Measurements   Left ventricle                           Value          Reference  LV ID, ED, PLAX chordal                  44.4  mm       43 - 52  LV ID, ES, PLAX chordal                  26.3  mm       23 - 38  LV fx shortening, PLAX chordal           41    %        >=29  LV PW thickness, ED                      11.7  mm       ----------  IVS/LV PW ratio, ED                      1              <=1.3  Stroke volume, 2D                        69    ml       ----------  Stroke volume/bsa, 2D                    38    ml/m^2   ----------  LV e&', lateral                           5.66  cm/s     ----------  LV E/e&', lateral                         19.26          ----------  LV e&', medial                            5     cm/s     ----------  LV E/e&', medial                          21.8           ----------  LV e&', average                           5.33  cm/s     ----------  LV E/e&', average                         20.45          ----------    Ventricular septum                       Value          Reference   IVS thickness, ED  11.7  mm       ----------    LVOT                                     Value          Reference  LVOT ID, S                               20    mm       ----------  LVOT area                                3.14  cm^2     ----------  LVOT peak velocity, S                    78.6  cm/s     ----------  LVOT mean velocity, S                    54    cm/s     ----------  LVOT VTI, S                              22.1  cm       ----------  LVOT peak gradient, S                    2     mm Hg    ----------    Aortic valve                             Value          Reference  Aortic valve peak velocity, S            481   cm/s     ----------  Aortic valve mean velocity, S            362   cm/s     ----------  Aortic valve VTI, S                      127   cm       ----------  Aortic mean gradient, S                  58    mm Hg    ----------  Aortic peak gradient, S                  93    mm Hg    ----------  VTI ratio, LVOT/AV                       0.17           ----------  Aortic valve area, VTI                   0.55  cm^2     ----------  Aortic valve area/bsa, VTI               0.3   cm^2/m^2 ----------  Velocity ratio, peak, LVOT/AV            0.16           ----------  Aortic valve area, peak velocity         0.51  cm^2     ----------  Aortic valve area/bsa, peak              0.28  cm^2/m^2 ----------  velocity  Velocity ratio, mean, LVOT/AV            0.15           ----------  Aortic valve area, mean velocity         0.47  cm^2     ----------  Aortic valve area/bsa, mean              0.26  cm^2/m^2 ----------  velocity    Aorta                                    Value          Reference  Aortic root ID, ED                       37    mm       ----------    Left atrium                              Value          Reference  LA ID, A-P, ES                           39    mm       ----------  LA ID/bsa, A-P                           2.13  cm/m^2    <=2.2  LA volume, S                             69.5  ml       ----------  LA volume/bsa, S                         37.9  ml/m^2   ----------  LA volume, ES, 1-p A4C                   51.7  ml       ----------  LA volume/bsa, ES, 1-p A4C               28.2  ml/m^2   ----------  LA volume, ES, 1-p A2C                   84.1  ml       ----------  LA volume/bsa, ES, 1-p A2C               45.9  ml/m^2   ----------    Mitral valve                             Value          Reference  Mitral E-wave peak velocity              109   cm/s     ----------  Mitral A-wave peak velocity              99.9  cm/s     ----------  Mitral deceleration time         (H)     278   ms       150 - 230  Mitral peak gradient, D                  5     mm Hg    ----------  Mitral E/A ratio, peak                   1.1            ----------    Right atrium                             Value          Reference  RA ID, S-I, ES, A4C              (H)     49.2  mm       34 - 49  RA area, ES, A4C                         15.3  cm^2     8.3 - 19.5  RA volume, ES, A/L                       39.3  ml       ----------  RA volume/bsa, ES, A/L                   21.4  ml/m^2   ----------    Systemic veins                           Value          Reference  Estimated CVP                            3     mm Hg    ----------    Right ventricle                          Value          Reference  TAPSE                                    20.8  mm       ----------  RV s&', lateral, S                        10.3  cm/s     ----------  Legend: (L)  and  (H)  mark values outside specified reference range.  ------------------------------------------------------------------- Renee Rival, M.D. 2019-06-03T10:48:53   RIGHT/LEFT HEART CATH AND CORONARY ANGIOGRAPHY  Conclusion     Ost RCA lesion is 40% stenosed.  Prox RCA to Dist RCA lesion is 20% stenosed.  1st RPLB lesion is 20% stenosed.  Prox Cx to Mid Cx lesion  is 30% stenosed.  3rd Mrg lesion is 30% stenosed.  Dist LAD lesion is 70% stenosed.  Ost LAD to Prox LAD lesion is 30% stenosed.  Dist LM to Ost LAD lesion is 20% stenosed.  LV end diastolic pressure is normal.   1. Mild non-obstructive CAD 2. Severe aortic valve stenosis (peak to peak gradient 43 mmHg, mean gradient 38 mmHg, AVA 0.70cm2)  Continue planning for TAVR.    Indications   Severe aortic stenosis [I35.0 (ICD-10-CM)]  Procedural Details/Technique   Technical Details Indication: 83 yo male with severe aortic stenosis, planning for TAVR  Procedure: The risks, benefits, complications, treatment options, and expected outcomes were discussed with the patient. The patient and/or family concurred with the proposed plan, giving informed consent. The patient was brought to the cath lab after IV hydration was given. The patient was further sedated with Versed and Fentanyl. The IV catheter present in the right antecubital vein was prepped and draped and changed for a 5 Fr sheath. Right heart cath performed with a balloon tipped catheter. The right wrist was prepped and draped in a sterile fashion. 1% lidocaine was used for local anesthesia. Using the modified Seldinger access technique, a 5 French sheath was placed in the right radial artery. 3 mg Verapamil was given through the sheath. 3500 units IV heparin was given. Standard diagnostic catheters were used to perform selective coronary angiography. I crossed the aortic valve with an AL-2 catheter and a straight wire. The sheath was removed from the right radial artery and a Terumo hemostasis band was applied at the arteriotomy site on the right wrist.     Estimated blood loss <50 mL.  During this procedure the patient was administered the following to achieve and maintain moderate conscious sedation: Versed 1 mg, Fentanyl 25 mcg, while the patient's heart rate, blood pressure, and oxygen saturation were continuously monitored. The  period of conscious sedation was 43 minutes, of which I was present face-to-face 100% of this time.  Complications   Complications documented before study signed (03/25/2018 11:15 AM EDT)    RIGHT/LEFT HEART CATH AND CORONARY ANGIOGRAPHY   None Documented by Burnell Blanks, MD 03/25/2018 11:09 AM EDT  Time Range: Intraprocedure      Coronary Findings   Diagnostic  Dominance: Right  Left Main  Dist LM to Ost LAD lesion 20% stenosed  Dist LM to Ost LAD lesion is 20% stenosed.  Left Anterior Descending  Vessel is large.  Ost LAD to Prox LAD lesion 30% stenosed  Ost LAD to Prox LAD lesion is 30% stenosed.  Dist LAD lesion 70% stenosed  Dist LAD lesion is 70% stenosed.  Left Circumflex  Vessel is large.  Prox Cx to Mid Cx lesion 30% stenosed  Prox Cx to Mid Cx lesion is 30% stenosed.  Third Obtuse Marginal Branch  Vessel is moderate in size. Vessel is angiographically normal.  3rd Mrg lesion 30% stenosed  3rd Mrg lesion is 30% stenosed.  Right Coronary Artery  Vessel is large.  Ost RCA lesion 40% stenosed  Ost RCA lesion is 40% stenosed.  Prox RCA to Dist RCA lesion 20% stenosed  Prox RCA to Dist RCA lesion is 20% stenosed.  First Right Posterolateral  1st RPLB lesion 20% stenosed  1st RPLB lesion is 20% stenosed.  Intervention   No interventions have been documented.  Right Heart   Right Heart Pressures LV EDP is normal.  Coronary Diagrams   Diagnostic Diagram       Implants    No implant documentation for this case.  MERGE Images   Show images for CARDIAC  CATHETERIZATION   Link to Procedure Log   Procedure Log    Hemo Data    Most Recent Value  Fick Cardiac Output 4.42 L/min  Fick Cardiac Output Index 2.52 (L/min)/BSA  Aortic Mean Gradient 38 mmHg  Aortic Peak Gradient 43 mmHg  Aortic Valve Area 0.70  Aortic Value Area Index 0.4 cm2/BSA  RA A Wave 3 mmHg  RA V Wave 0 mmHg  RA Mean 1 mmHg  RV Systolic Pressure 24 mmHg  RV Diastolic  Pressure 1 mmHg  RV EDP 2 mmHg  PA Systolic Pressure 27 mmHg  PA Diastolic Pressure 10 mmHg  PA Mean 18 mmHg  PW A Wave 14 mmHg  PW V Wave 12 mmHg  PW Mean 11 mmHg  AO Systolic Pressure 95 mmHg  AO Diastolic Pressure 51 mmHg  AO Mean 68 mmHg  LV Systolic Pressure 416 mmHg  LV Diastolic Pressure 4 mmHg  LV EDP 11 mmHg  AOp Systolic Pressure 606 mmHg  AOp Diastolic Pressure 55 mmHg  AOp Mean Pressure 74 mmHg  LVp Systolic Pressure 301 mmHg  LVp Diastolic Pressure 3 mmHg  LVp EDP Pressure 10 mmHg  QP/QS 1  TPVR Index 7.13 HRUI  TSVR Index 26.93 HRUI  PVR SVR Ratio 0.1  TPVR/TSVR Ratio 0.26     Cardiac TAVR CT  TECHNIQUE: The patient was scanned on a [employer]. A 120 kV retrospective scan was triggered in the descending thoracic aorta at 111 HU's. Gantry rotation speed was 250 msecs and collimation was .6 mm. No beta blockade or nitro were given. The 3D data set was reconstructed in 5% intervals of the R-R cycle. Systolic and diastolic phases were analyzed on a dedicated work station using MPR, MIP and VRT modes. The patient received 80 cc of contrast.  FINDINGS: Aortic Valve: Trileaflet aortic valve with severely thickened and calcified leaflets and severely restricted leaflets opening. There are mild calcifications extending into the LVOT under the left coronary cusp.  Aorta: There is mild ascending aortic aneurysm with maximum diameter 41 mm. Mild diffuse calcification in the thoracic aorta, no dissection.  Sinotubular Junction: 30 x 30 mm  Ascending Thoracic Aorta: 41 x 41 mm  Aortic Arch: 36 x 34 mm  Descending Thoracic Aorta: 29 x 29 mm  Sinus of Valsalva Measurements:  Non-coronary: 37 mm  Right -coronary: 34 mm  Left -coronary: 38 mm  Coronary Artery Height above Annulus:  Left Main: 15 mm  Right Coronary: 19 mm  Virtual Basal Annulus Measurements:  Maximum/Minimum Diameter: 28.1 x 24.0 mm  Mean Diameter:  24.8 mm  Perimeter: 80.2 mm  Area: 484 mm2  Optimum Fluoroscopic Angle for Delivery: LAO 8 CAU 5.  IMPRESSION: 1. Trileaflet aortic valve with severely thickened and calcified leaflets and severely restricted leaflets opening. There are mild calcifications extending into the LVOT under the left coronary cusp. Annular measurements suitable for delivery of a 26 mm Edwards-SAPIEN 3 valve.  2. Sufficient coronary to annulus distance.  3. Optimum Fluoroscopic Angle for Delivery:  LAO 8 CAU 5.  4. There is mild ascending aortic aneurysm with maximum diameter 41 mm.  5. No thrombus in the left atrial appendage.  Electronically Signed: By: Ena Dawley On: 04/06/2018 16:28   CT ANGIOGRAPHY CHEST, ABDOMEN AND PELVIS  TECHNIQUE: Multidetector CT imaging through the chest, abdomen and pelvis was performed using the standard protocol during bolus administration of intravenous contrast. Multiplanar reconstructed images and MIPs were obtained and reviewed to evaluate the vascular anatomy.  CONTRAST:  154mL ISOVUE-370 IOPAMIDOL (ISOVUE-370) INJECTION 76%  COMPARISON:  08/19/2012 chest radiograph.  FINDINGS: CTA CHEST FINDINGS  Cardiovascular: Top-normal heart size. No significant pericardial effusion/thickening. Left main and 3 vessel coronary atherosclerosis. Severe thickening and calcification of the aortic valve. Atherosclerotic thoracic aorta with ectatic 4.1 cm ascending thoracic aorta. Top-normal main pulmonary artery (3.2 cm diameter). No central pulmonary emboli.  Mediastinum/Nodes: No discrete thyroid nodules. Unremarkable esophagus. No pathologically enlarged axillary, mediastinal or hilar lymph nodes.  Lungs/Pleura: No pneumothorax. No pleural effusion. Moderate centrilobular and paraseptal emphysema with diffuse bronchial wall thickening. There is a 1.3 x 1.1 cm nodular opacity in the central right lower lobe (series 16/image 50) with  associated segmental airway narrowing. There is moderate focal varicoid bronchiectasis in the medial right lower lobe. Otherwise, no acute consolidative airspace disease, lung masses or additional significant pulmonary nodules.  Musculoskeletal: No aggressive appearing focal osseous lesions. Marked thoracic spondylosis.  CTA ABDOMEN AND PELVIS FINDINGS  Hepatobiliary: Normal liver with no liver mass. Normal gallbladder with no radiopaque cholelithiasis. No biliary ductal dilatation.  Pancreas: Normal, with no mass or duct dilation.  Spleen: Normal size. No mass.  Adrenals/Urinary Tract: Normal adrenals. Simple exophytic 3.7 cm renal cyst in the lateral lower right kidney. Simple exophytic 1.4 cm lateral lower left renal cyst. No hydronephrosis. Normal bladder.  Stomach/Bowel: Normal non-distended stomach. Normal caliber small bowel with no small bowel wall thickening. Normal appendix. Moderate diffuse colonic diverticulosis, with no definite large bowel wall thickening or significant pericolonic fat stranding.  Vascular/Lymphatic: Atherosclerotic abdominal aorta with 3.0 cm infrarenal abdominal aortic aneurysm. Patent renal veins. No pathologically enlarged lymph nodes in the abdomen or pelvis.  Reproductive: Mildly enlarged prostate with coarse nonspecific internal prostatic calcifications.  Other: No pneumoperitoneum, ascites or focal fluid collection.  Musculoskeletal: No aggressive appearing focal osseous lesions. Moderate lumbar spondylosis.  VASCULAR MEASUREMENTS PERTINENT TO TAVR:  AORTA:  Minimal Aortic Diameter-18.5 x 17.8 mm (infrarenal abdominal aorta on series 15/image 114)  Severity of Aortic Calcification-moderate  RIGHT PELVIS:  Right Common Iliac Artery -  Minimal Diameter-11.2 x 10.9 mm  Tortuosity-mild  Calcification-moderate  Right External Iliac Artery -  Minimal Diameter-8.4 x 6.0 mm  Tortuosity-moderate to  severe  Calcification-moderate  Right Common Femoral Artery -  Minimal Diameter-7.8 x 6.5 mm  Tortuosity-mild  Calcification-moderate  LEFT PELVIS:  Left Common Iliac Artery -  Minimal Diameter-9.9 x 9.7 mm  Tortuosity-mild  Calcification-moderate  Left External Iliac Artery -  Minimal Diameter-7.9 x 6.7 mm  Tortuosity-mild  Calcification-moderate, noting large soft plaque with associated mild focal fusiform ectasia in the mid left external iliac artery  Left Common Femoral Artery -  Minimal Diameter-8.3 x 7.2 mm  Tortuosity-mild  Calcification-moderate  Review of the MIP images confirms the above findings.  IMPRESSION: 1. Vascular findings and measurements pertinent to potential TAVR procedure, as detailed above. 2. Severe thickening and calcification of the aortic valve, compatible with the reported clinical history of severe aortic stenosis. 3. Indeterminate 1.3 cm nodular opacity in the central right lower lung lobe, with associated segmental airway narrowing. Findings may represent impacted mucous, with underlying neoplasm not excluded. Consider one of the following in 3 months in this high-risk individual: (a) repeat chest CT, (b) follow-up PET-CT, or (c) tissue sampling. This recommendation follows the consensus statement: Guidelines for Management of Incidental Pulmonary Nodules Detected on CT Images: From the Fleischner Society 2017; Radiology 2017; 284:228-243. 4. Left main and 3 vessel coronary atherosclerosis. 5. Infrarenal 3.1 cm abdominal aortic aneurysm.  Abdominal Aortic Aneurysm (ICD10-I71.9). Recommend follow-up aortic ultrasound in 3 years. This recommendation follows ACR consensus guidelines: White Paper of the ACR Incidental Findings Committee II on Vascular Findings. J Am Coll Radiol 2013; 10:789-794. 6. Aortic Atherosclerosis (ICD10-I70.0) and Emphysema (ICD10-J43.9). 7. Moderate colonic  diverticulosis.   Electronically Signed   By: Ilona Sorrel M.D.   On: 04/07/2018 13:13  Impression:  Patient has stage D severe symptomatic aortic stenosis.  He describes gradual progression of symptoms of worsening fatigue and exertional shortness of breath consistent with chronic diastolic congestive heart failure, New York Heart Association functional class II.  I have personally reviewed the patient's recent transthoracic echocardiogram, diagnostic cardiac catheterization, and CT angiograms.  Echocardiogram reveals presence of severe aortic stenosis.  Aortic valve appears trileaflet but may be functionally bicuspid.  There is severe thickening, calcification, and restricted leaflet mobility involving all 3 leaflets.  Peak velocity across the aortic valve measured 4.8 m/s corresponding to mean transvalvular gradient estimated greater than 50 mmHg.  Left ventricular systolic function remains preserved.  The patient does have left ventricular hypertrophy with diastolic dysfunction.  Diagnostic cardiac catheterization confirmed the presence of severe aortic stenosis and revealed moderate nonobstructive three-vessel coronary artery disease.  I agree the patient needs elective aortic valve replacement.  Although risks associated with conventional surgery would be acceptably low they may be somewhat elevated because of the patient's advanced age, long-standing tobacco use, and mild memory loss.  Cardiac-gated CTA of the heart reveals anatomical characteristics consistent with aortic stenosis suitable for treatment by transcatheter aortic valve replacement without any significant complicating features and CTA of the aorta and iliac vessels demonstrate what appears to be adequate pelvic vascular access to facilitate a transfemoral approach.  Under the circumstances I feel that transcatheter aortic valve replacement would be far less invasive and probably risks less risky than conventional surgery.  CT  angiography also revealed a 1.3 cm nodule in the right lower lobe which will require follow-up.  Finally, the patient has a very small infrarenal abdominal aortic aneurysm and mild fusiform enlargement of the ascending thoracic aorta.  Long-term follow-up imaging could be considered.    Plan:  The patient and his 2 daughters were counseled at length regarding treatment alternatives for management of severe symptomatic aortic stenosis. Alternative approaches such as conventional aortic valve replacement, transcatheter aortic valve replacement, and continued medical therapy without intervention were compared and contrasted at length.  The risks associated with conventional surgical aortic valve replacement were discussed in detail, as were expectations for post-operative convalescence.  Issues specific to transcatheter aortic valve replacement were discussed including questions about long term valve durability, the potential for paravalvular leak, possible increased risk of need for permanent pacemaker placement, and other technical complications related to the procedure itself.  Long-term prognosis with medical therapy was discussed. This discussion was placed in the context of the patient's own specific clinical presentation and past medical history.  All of their questions have been addressed.  The patient hopes to proceed with transcatheter aortic valve replacement as soon as practical.  We tentatively plan to proceed on April 21, 2018.  Following the decision to proceed with transcatheter aortic valve replacement, a discussion has been held regarding what types of management strategies would be attempted intraoperatively in the event of life-threatening complications, including whether or not the patient would be considered a candidate for the use of cardiopulmonary bypass and/or conversion to open sternotomy for attempted surgical intervention.  The patient would want aggressive measures if  necessary.   The patient has been advised of a variety of complications that might develop including but not limited to risks of death, stroke, paravalvular leak, aortic dissection or other major vascular complications, aortic annulus rupture, device embolization, cardiac rupture or perforation, mitral regurgitation, acute myocardial infarction, arrhythmia, heart block or bradycardia requiring permanent pacemaker placement, congestive heart failure, respiratory failure, renal failure, pneumonia, infection, other late complications related to structural valve deterioration or migration, or other complications that might ultimately cause a temporary or permanent loss of functional independence or other long term morbidity.  The patient provides full informed consent for the procedure as described and all questions were answered.    I spent in excess of 90 minutes during the conduct of this office consultation and >50% of this time involved direct face-to-face encounter with the patient for counseling and/or coordination of their care.     Valentina Gu. Roxy Manns, MD 04/13/2018 10:59 AM

## 2018-04-13 NOTE — Therapy (Signed)
[place], [place], [address] Phone: [phone]   Fax:  [phone]  Physical Therapy Evaluation  Patient Details  Name: Travis Murphy MRN: [id number] Date of Birth: April 28, 1935 Referring Provider: Burnell Blanks, MD   Encounter Date: 04/13/2018  PT End of Session - 04/13/18 0931    Visit Number  1    Authorization Type  TAVR EVAL    PT Start Time  0928    PT Stop Time  1000    PT Time Calculation (min)  32 min    Equipment Utilized During Treatment  Gait belt    Activity Tolerance  Patient tolerated treatment well    Behavior During Therapy  [facility] for tasks assessed/performed       Past Medical History:  Diagnosis Date  . Clavicular fracture   . Essential hypertension   . Hyperlipemia   . Hyperplastic colon polyp 2002   Dr. Arnoldo Morale  . Pulmonary nodule    a. right lower lobe. Seen on pre TAVR CT scan and will need follow up  . Severe aortic stenosis    a. severe AS by echo in 2017 b. 01/2018: repeat echo showing EF of 55-60%, Grade 2 DD, and severe AS with mean gradient of 58 mm Hg  . Type 2 diabetes mellitus ([place])     Past Surgical History:  Procedure Laterality Date  . Cataract surgery Right   . COLONOSCOPY  08/17/2001   Small polyp measured 3 mm removed/multiple middle scattered diverticula in the ascending colon-hyperplastic  . COLONOSCOPY  08/14/2011   Procedure: COLONOSCOPY;  Surgeon: Daneil Dolin, MD;  Location: [facility];  Service: Endoscopy;  Laterality: N/A;  10:00  . RIGHT/LEFT HEART CATH AND CORONARY ANGIOGRAPHY N/A 03/25/2018   Procedure: RIGHT/LEFT HEART CATH AND CORONARY ANGIOGRAPHY;  Surgeon: Burnell Blanks, MD;  Location: [facility];  Service: Cardiovascular;  Laterality: N/A;    There were no vitals filed for this visit.   Subjective Assessment - 04/13/18 0930    Subjective  About any time I do anything I feel SOB. A couple of months ago  began noticing.     Currently in Pain?  No/denies         [facility] PT Assessment - 04/13/18 0001      Assessment   Medical Diagnosis  severe aortic stenosis    Referring Provider  Burnell Blanks, MD    Onset Date/Surgical Date  --   a couple months ago   Hand Dominance  Right      Precautions   Precaution Comments  vertigo      Balance Screen   Has the patient fallen in the past 6 months  No      [place] residence    Living Arrangements  Alone    Additional Comments  stairs to basement- washer and dryer downstairs      Prior Function   Level of Independence  Independent    Vocation  Retired      Associate Professor   Overall Cognitive Status  Within Functional Limits for tasks assessed      Sensation   Additional Comments  [facility]      Posture/Postural Control   Posture Comments  rounded shoulders with forward head      ROM / Strength   AROM / PROM / Strength  AROM;Strength      AROM   Overall AROM Comments  gross [facility]      Strength   Overall Strength Comments  gross UE & Le 5/5    Strength Assessment Site  Hand    Right/Left hand  Right;Left    Right Hand Grip (lbs)  53    Left Hand Grip (lbs)  50      Ambulation/Gait   Gait Comments  slow cadence, good step length and width, heel-toe pattern noted       [facility] Pre-Surgical Assessment - 04/13/18 0001    5 Meter Walk Test- trial 1  5 sec    5 Meter Walk Test- trial 2  5 sec.     5 Meter Walk Test- trial 3  5 sec.    5 meter walk test average  5 sec    4 Stage Balance Test Position  4    comment  full tandem 10+s    Sit To Stand Test- trial 1  16 sec.    Other comment  10/12 short physical performance battery frailty index    6 Minute Walk- Baseline  yes    BP (mmHg)  118/59    HR (bpm)  66    02 Sat (%RA)  98 %    6 Minute Walk Post Test  yes    BP (mmHg)  120/74    HR (bpm)  72    02 Sat (%RA)  94 %    Modified Borg Scale for Dyspnea  0- Nothing at all     Perceived Rate of Exertion (Borg)  6-    Aerobic Endurance Distance Walked  875    Endurance additional comments  no rest breaks requested, 36% disability vs age norm       Clinical Impression Statement: Pt is a 83 yo Male presenting to OP PT for evaluation prior to possible TAVR surgery due to severe aortic stenosis. Pt reports onset of SOB approximately "a couple of" months ago. Symptoms are limiting respiration during daily activities.Pt ambulated a total of 875 feet in 6 minute walk. HR to 87 BPM and O2 saturation to 94%. Based on the Short Physical Performance Battery, patient has a frailty rating of 10/12 with </= 5/12 considered frail.     Visit Diagnosis: Other abnormalities of gait and mobility     Problem List Patient Active Problem List   Diagnosis Date Noted  . Severe aortic valve stenosis   . Essential hypertension   . History of colon polyps 07/15/2011    Celisa Schoenberg C. Huxley Vanwagoner PT, DPT 04/13/18 10:11 AM   [facility] [address] [place], [place], [address] Phone: [phone]   Fax:  [phone]  Name: Travis Murphy MRN: [id number] Date of Birth: 1935/02/21

## 2018-04-13 NOTE — Progress Notes (Signed)
HEART AND [facility]ERY CONSULTATION REPORT  Referring Provider is Satira Sark, MD PCP is Asencion Noble, MD  Chief Complaint  Patient presents with  . Aortic Stenosis    eval for TAVR    HPI:  Patient is an 83 year old male with history of aortic stenosis, hypertension, hyperlipidemia, and long-standing tobacco abuse who has been referred for surgical consultation to discuss treatment options for management of severe symptomatic aortic stenosis.  The patient states that he has had a heart murmur for many years.  He has been followed carefully initially by Dr. Willey Blade and more recently by Dr. Domenic Polite.  Echocardiograms have demonstrated the presence of aortic stenosis that has gradually progressed in severity.  Left ventricular function has remained normal.  Patient claims to be asymptomatic until fairly recently.  The patient now admits to gradual decline in his exercise tolerance with worsening fatigue and exertional shortness of breath.  Follow-up echocardiogram performed further progression of his aortic stenosis with peak velocity across the aortic valve measured 4.8 m/s corresponding to mean transvalvular gradient estimated 58 mmHg.  The DVI was quite low at 0.16 and aortic valve area calculated 0.51 cm.  The patient was referred to the multidisciplinary heart valve clinic and has been evaluated previously by Dr. Angelena Form.  Diagnostic cardiac catheterization performed March 25, 2018 confirmed the presence of severe aortic stenosis with peak to peak and mean transvalvular gradients measured 43 and 38 mmHg respectively, corresponding to aortic valve area calculated 0.70 cm.  Patient was noted to have moderate nonobstructive three-vessel coronary artery disease.  Right heart pressures were normal.  The patient was referred for surgical consultation and has subsequently undergone CT angiography.  He presents to our office today  for routine consultation and is accompanied by his 2 daughters.  Patient is widowed but remarried.  He lives alone in [place], [place].  He previously worked for more than 30 years for [employer] but he has been retired since 1993.  He remains reasonably active physically and entirely functionally independent.  He enjoys working around the house.  He previously played tennis for many years although he stopped playing tennis approximately 15 years ago.  He drives a car.  He admits to a gradual decline in his exercise tolerance with decreased energy and some degree of exertional shortness of breath.  He denies any history of resting shortness of breath, PND, orthopnea, or lower extremity edema.  He has not had any exertional chest pain or chest tightness.  He reports occasional dizzy spells without syncope.  He admits to mild memory loss that has been slowly progressive.  Past Medical History:  Diagnosis Date  . Clavicular fracture   . Essential hypertension   . Hyperlipemia   . Hyperplastic colon polyp 2002   Dr. Arnoldo Morale  . Pulmonary nodule    a. right lower lobe. Seen on pre TAVR CT scan and will need follow up  . Severe aortic stenosis    a. severe AS by echo in 2017 b. 01/2018: repeat echo showing EF of 55-60%, Grade 2 DD, and severe AS with mean gradient of 58 mm Hg  . Type 2 diabetes mellitus ([place])     Past Surgical History:  Procedure Laterality Date  . Cataract surgery Right   . COLONOSCOPY  08/17/2001   Small polyp measured 3 mm removed/multiple middle scattered diverticula in the ascending colon-hyperplastic  . COLONOSCOPY  08/14/2011   Procedure: COLONOSCOPY;  Surgeon: Cristopher Estimable  Rourk, MD;  Location: [facility];  Service: Endoscopy;  Laterality: N/A;  10:00  . RIGHT/LEFT HEART CATH AND CORONARY ANGIOGRAPHY N/A 03/25/2018   Procedure: RIGHT/LEFT HEART CATH AND CORONARY ANGIOGRAPHY;  Surgeon: Burnell Blanks, MD;  Location: [facility];  Service: Cardiovascular;   Laterality: N/A;    Family History  Problem Relation Age of Onset  . Stroke Mother   . Heart attack Father   . Liver disease Sister   . Renal Disease Brother     Social History   Socioeconomic History  . Marital status: Widowed    Spouse name: Not on file  . Number of children: 2  . Years of education: Not on file  . Highest education level: Not on file  Occupational History  . Occupation: retired; [employer]  . Financial resource strain: Not on file  . Food insecurity:    Worry: Not on file    Inability: Not on file  . Transportation needs:    Medical: Not on file    Non-medical: Not on file  Tobacco Use  . Smoking status: Former Smoker    Packs/day: 0.50    Years: 65.00    Pack years: 32.50    Types: Cigarettes  . Smokeless tobacco: Never Used  . Tobacco comment: quit about 4-5 yrs ago  Substance and Sexual Activity  . Alcohol use: No    Alcohol/week: 0.0 standard drinks  . Drug use: No  . Sexual activity: Not Currently  Lifestyle  . Physical activity:    Days per week: Not on file    Minutes per session: Not on file  . Stress: Not on file  Relationships  . Social connections:    Talks on phone: Not on file    Gets together: Not on file    Attends religious service: Not on file    Active member of club or organization: Not on file    Attends meetings of clubs or organizations: Not on file    Relationship status: Not on file  . Intimate partner violence:    Fear of current or ex partner: Not on file    Emotionally abused: Not on file    Physically abused: Not on file    Forced sexual activity: Not on file  Other Topics Concern  . Not on file  Social History Narrative   Widower (married for 78yrs); Remarried in 2016       Current Outpatient Medications  Medication Sig Dispense Refill  . aspirin EC 81 MG tablet Take 81 mg by mouth daily.      Marland Kitchen atorvastatin (LIPITOR) 10 MG tablet Take 10 mg by mouth daily.  4  . meclizine (ANTIVERT)  25 MG tablet Take 1 tablet (25 mg total) by mouth 4 (four) times daily. (Patient taking differently: Take 25 mg by mouth 3 (three) times daily as needed for dizziness. ) 2 tablet 0  . ramipril (ALTACE) 10 MG capsule Take 10 mg by mouth daily.      No current facility-administered medications for this visit.     No Known Allergies    Review of Systems:   General:  normal appetite, decreased energy, no weight gain, no weight loss, no fever  Cardiac:  no chest pain with exertion, no chest pain at rest, + SOB with exertion, no resting SOB, no PND, no orthopnea, no palpitations, no arrhythmia, no atrial fibrillation, no LE edema, + dizzy spells, no syncope  Respiratory:  +  exertional shortness of breath, no home oxygen, no productive cough, + dry cough, no bronchitis, no wheezing, no hemoptysis, no asthma, no pain with inspiration or cough, no sleep apnea, no CPAP at night  GI:   no difficulty swallowing, no reflux, no frequent heartburn, no hiatal hernia, no abdominal pain, no constipation, no diarrhea, no hematochezia, no hematemesis, no melena  GU:   no dysuria,  no frequency, no urinary tract infection, no hematuria, no enlarged prostate, no kidney stones, no kidney disease  Vascular:  no pain suggestive of claudication, no pain in feet, no leg cramps, no varicose veins, no DVT, no non-healing foot ulcer  Neuro:   no stroke, no TIA's, no seizures, no headaches, no temporary blindness one eye,  no slurred speech, no peripheral neuropathy, no chronic pain, mild instability of gait, mild memory/cognitive dysfunction  Musculoskeletal: no arthritis, no joint swelling, no myalgias, no difficulty walking, normal mobility   Skin:   no rash, no itching, no skin infections, no pressure sores or ulcerations  Psych:   no anxiety, no depression, no nervousness, no unusual recent stress  Eyes:   no blurry vision, no floaters, no recent vision changes, + wears glasses or contacts  ENT:   no hearing loss, no  loose or painful teeth, no dentures, last saw dentist February 2019  Hematologic:  no easy bruising, no abnormal bleeding, no clotting disorder, no frequent epistaxis  Endocrine:  no diabetes, does not check CBG's at home           Physical Exam:   BP 117/65 (BP Location: Right Arm, Patient Position: Sitting, Cuff Size: Large)   Pulse 69   Resp 16   Ht 5\' 5"  (1.651 m)   Wt 150 lb (68 kg)   SpO2 94% Comment: ON RA  BMI 24.96 kg/m   General:  Thin, elderly,  well-appearing  HEENT:  Unremarkable   Neck:   no JVD, no bruits, no adenopathy   Chest:   clear to auscultation, symmetrical breath sounds, no wheezes, no rhonchi   CV:   RRR, grade IV/VI crescendo/decrescendo murmur heard best at LUSB,  no diastolic murmur  Abdomen:  soft, non-tender, no masses   Extremities:  warm, well-perfused, pulses palpable, no LE edema  Rectal/GU  Deferred  Neuro:   Grossly non-focal and symmetrical throughout  Skin:   Clean and dry, no rashes, no breakdown   Diagnostic Tests:  Transthoracic Echocardiography  (Report amended )  Patient:    Alistair, Senft MR #:       [id number] Study Date: 01/26/2018 Gender:     M Age:        66 Height:     165.1 cm Weight:     72.1 kg BSA:        1.83 m^2 Pt. Status: Room:   ATTENDING    Asencion Noble [id number]  [id number]     JHERD, EYC [id number]  Luster Landsberg [id number]  SONOGRAPHER  Leavy Cella  PERFORMING   Ch[facility], Forestine Na  cc:  ------------------------------------------------------------------- LV EF: 55% -   60%  ------------------------------------------------------------------- Indications:      Aortic stenosis 424.1.  ------------------------------------------------------------------- History:   PMH:  Former SMoker, Aortic Stenosis.  ------------------------------------------------------------------- Study Conclusions  - Left ventricle: The cavity size was normal. Wall thickness was   increased in a pattern of mild  LVH. Systolic function was normal.   The estimated ejection fraction was in the range of 55% to 60%.  Wall motion was normal; there were no regional wall motion   abnormalities. Features are consistent with a pseudonormal left   ventricular filling pattern, with concomitant abnormal relaxation   and increased filling pressure (grade 2 diastolic dysfunction). - Aortic valve: Functionally bicuspid; moderately calcified   leaflets. There was severe stenosis. There was mild   regurgitation. Mean gradient (S): 58 mm Hg. Peak gradient (S): 93   mm Hg. VTI ratio of LVOT to aortic valve: 0.17. Valve area (VTI):   0.55 cm^2. Valve area (Vmax): 0.51 cm^2. Valve area (Vmean): 0.47   cm^2. - Mitral valve: Mildly calcified annulus. There was mild   regurgitation. - Left atrium: The atrium was mildly dilated. - Right atrium: Central venous pressure (est): 3 mm Hg. - Tricuspid valve: There was mild regurgitation. - Pulmonary arteries: Systolic pressure could not be accurately   estimated. - Pericardium, extracardiac: There was no pericardial effusion.  Impressions:  - Trileaflet but functionally bicuspid aortic valve, moderately   calcified. There is severe aortic stenosis that has further   progressed compared to the prior study in 2017. Mild aortic   regurgitation.  ------------------------------------------------------------------- Study data:  Comparison was made to the study of 12/25/2015.  Study status:  Routine.  Procedure:  Transthoracic echocardiography. Image quality was adequate.  Study completion:  There were no complications.          Transthoracic echocardiography.  M-mode, complete 2D, spectral Doppler, and color Doppler.  Birthdate: Patient birthdate: 17-Jan-1935.  Age:  Patient is 83 yr old.  Sex: Gender: male.    BMI: 26.5 kg/m^2.  Blood pressure:     128/67 Patient status:  Outpatient.  Study date:  Study date: 01/26/2018. Study time: 08:55 AM.  Location:  Echo  laboratory.  -------------------------------------------------------------------  ------------------------------------------------------------------- Left ventricle:  The cavity size was normal. Wall thickness was increased in a pattern of mild LVH. Systolic function was normal. The estimated ejection fraction was in the range of 55% to 60%. Wall motion was normal; there were no regional wall motion abnormalities. Features are consistent with a pseudonormal left ventricular filling pattern, with concomitant abnormal relaxation and increased filling pressure (grade 2 diastolic dysfunction).  ------------------------------------------------------------------- Aortic valve:   Functionally bicuspid; moderately calcified leaflets.  Doppler:   There was severe stenosis.   There was mild regurgitation.    VTI ratio of LVOT to aortic valve: 0.17. Valve area (VTI): 0.55 cm^2. Indexed valve area (VTI): 0.3 cm^2/m^2. Peak velocity ratio of LVOT to aortic valve: 0.16. Valve area (Vmax): 0.51 cm^2. Indexed valve area (Vmax): 0.28 cm^2/m^2. Mean velocity ratio of LVOT to aortic valve: 0.15. Valve area (Vmean): 0.47 cm^2. Indexed valve area (Vmean): 0.26 cm^2/m^2.    Mean gradient (S): 58 mm Hg. Peak gradient (S): 93 mm Hg.  ------------------------------------------------------------------- Aorta:  Aortic root: The aortic root was normal in size.  ------------------------------------------------------------------- Mitral valve:   Mildly calcified annulus.  Doppler:  There was mild regurgitation.    Peak gradient (D): 5 mm Hg.  ------------------------------------------------------------------- Left atrium:  The atrium was mildly dilated.  ------------------------------------------------------------------- Right ventricle:  The cavity size was normal. Systolic function was normal.  ------------------------------------------------------------------- Pulmonic valve:   Poorly  visualized.  ------------------------------------------------------------------- Tricuspid valve:   The valve appears to be grossly normal. Doppler:  There was mild regurgitation.  ------------------------------------------------------------------- Pulmonary artery:    Systolic pressure could not be accurately estimated.  ------------------------------------------------------------------- Right atrium:  The atrium was normal in size.  ------------------------------------------------------------------- Pericardium:  There was no pericardial effusion.  -------------------------------------------------------------------  Systemic veins: Inferior vena cava: The vessel was normal in size. The respirophasic diameter changes were in the normal range (= 50%), consistent with normal central venous pressure.  ------------------------------------------------------------------- Measurements   Left ventricle                           Value          Reference  LV ID, ED, PLAX chordal                  44.4  mm       43 - 52  LV ID, ES, PLAX chordal                  26.3  mm       23 - 38  LV fx shortening, PLAX chordal           41    %        >=29  LV PW thickness, ED                      11.7  mm       ----------  IVS/LV PW ratio, ED                      1              <=1.3  Stroke volume, 2D                        69    ml       ----------  Stroke volume/bsa, 2D                    38    ml/m^2   ----------  LV e&', lateral                           5.66  cm/s     ----------  LV E/e&', lateral                         19.26          ----------  LV e&', medial                            5     cm/s     ----------  LV E/e&', medial                          21.8           ----------  LV e&', average                           5.33  cm/s     ----------  LV E/e&', average                         20.45          ----------    Ventricular septum                       Value          Reference   IVS thickness, ED  11.7  mm       ----------    LVOT                                     Value          Reference  LVOT ID, S                               20    mm       ----------  LVOT area                                3.14  cm^2     ----------  LVOT peak velocity, S                    78.6  cm/s     ----------  LVOT mean velocity, S                    54    cm/s     ----------  LVOT VTI, S                              22.1  cm       ----------  LVOT peak gradient, S                    2     mm Hg    ----------    Aortic valve                             Value          Reference  Aortic valve peak velocity, S            481   cm/s     ----------  Aortic valve mean velocity, S            362   cm/s     ----------  Aortic valve VTI, S                      127   cm       ----------  Aortic mean gradient, S                  58    mm Hg    ----------  Aortic peak gradient, S                  93    mm Hg    ----------  VTI ratio, LVOT/AV                       0.17           ----------  Aortic valve area, VTI                   0.55  cm^2     ----------  Aortic valve area/bsa, VTI               0.3   cm^2/m^2 ----------  Velocity ratio, peak, LVOT/AV            0.16           ----------  Aortic valve area, peak velocity         0.51  cm^2     ----------  Aortic valve area/bsa, peak              0.28  cm^2/m^2 ----------  velocity  Velocity ratio, mean, LVOT/AV            0.15           ----------  Aortic valve area, mean velocity         0.47  cm^2     ----------  Aortic valve area/bsa, mean              0.26  cm^2/m^2 ----------  velocity    Aorta                                    Value          Reference  Aortic root ID, ED                       37    mm       ----------    Left atrium                              Value          Reference  LA ID, A-P, ES                           39    mm       ----------  LA ID/bsa, A-P                           2.13  cm/m^2    <=2.2  LA volume, S                             69.5  ml       ----------  LA volume/bsa, S                         37.9  ml/m^2   ----------  LA volume, ES, 1-p A4C                   51.7  ml       ----------  LA volume/bsa, ES, 1-p A4C               28.2  ml/m^2   ----------  LA volume, ES, 1-p A2C                   84.1  ml       ----------  LA volume/bsa, ES, 1-p A2C               45.9  ml/m^2   ----------    Mitral valve                             Value          Reference  Mitral E-wave peak velocity              109   cm/s     ----------  Mitral A-wave peak velocity              99.9  cm/s     ----------  Mitral deceleration time         (H)     278   ms       150 - 230  Mitral peak gradient, D                  5     mm Hg    ----------  Mitral E/A ratio, peak                   1.1            ----------    Right atrium                             Value          Reference  RA ID, S-I, ES, A4C              (H)     49.2  mm       34 - 49  RA area, ES, A4C                         15.3  cm^2     8.3 - 19.5  RA volume, ES, A/L                       39.3  ml       ----------  RA volume/bsa, ES, A/L                   21.4  ml/m^2   ----------    Systemic veins                           Value          Reference  Estimated CVP                            3     mm Hg    ----------    Right ventricle                          Value          Reference  TAPSE                                    20.8  mm       ----------  RV s&', lateral, S                        10.3  cm/s     ----------  Legend: (L)  and  (H)  mark values outside specified reference range.  ------------------------------------------------------------------- Renee Rival, M.D. 2019-06-03T10:48:53   RIGHT/LEFT HEART CATH AND CORONARY ANGIOGRAPHY  Conclusion     Ost RCA lesion is 40% stenosed.  Prox RCA to Dist RCA lesion is 20% stenosed.  1st RPLB lesion is 20% stenosed.  Prox Cx to Mid Cx lesion  is 30% stenosed.  3rd Mrg lesion is 30% stenosed.  Dist LAD lesion is 70% stenosed.  Ost LAD to Prox LAD lesion is 30% stenosed.  Dist LM to Ost LAD lesion is 20% stenosed.  LV end diastolic pressure is normal.   1. Mild non-obstructive CAD 2. Severe aortic valve stenosis (peak to peak gradient 43 mmHg, mean gradient 38 mmHg, AVA 0.70cm2)  Continue planning for TAVR.    Indications   Severe aortic stenosis [I35.0 (ICD-10-CM)]  Procedural Details/Technique   Technical Details Indication: 83 yo male with severe aortic stenosis, planning for TAVR  Procedure: The risks, benefits, complications, treatment options, and expected outcomes were discussed with the patient. The patient and/or family concurred with the proposed plan, giving informed consent. The patient was brought to the cath lab after IV hydration was given. The patient was further sedated with Versed and Fentanyl. The IV catheter present in the right antecubital vein was prepped and draped and changed for a 5 Fr sheath. Right heart cath performed with a balloon tipped catheter. The right wrist was prepped and draped in a sterile fashion. 1% lidocaine was used for local anesthesia. Using the modified Seldinger access technique, a 5 French sheath was placed in the right radial artery. 3 mg Verapamil was given through the sheath. 3500 units IV heparin was given. Standard diagnostic catheters were used to perform selective coronary angiography. I crossed the aortic valve with an AL-2 catheter and a straight wire. The sheath was removed from the right radial artery and a Terumo hemostasis band was applied at the arteriotomy site on the right wrist.     Estimated blood loss <50 mL.  During this procedure the patient was administered the following to achieve and maintain moderate conscious sedation: Versed 1 mg, Fentanyl 25 mcg, while the patient's heart rate, blood pressure, and oxygen saturation were continuously monitored. The  period of conscious sedation was 43 minutes, of which I was present face-to-face 100% of this time.  Complications   Complications documented before study signed (03/25/2018 11:15 AM EDT)    RIGHT/LEFT HEART CATH AND CORONARY ANGIOGRAPHY   None Documented by Burnell Blanks, MD 03/25/2018 11:09 AM EDT  Time Range: Intraprocedure      Coronary Findings   Diagnostic  Dominance: Right  Left Main  Dist LM to Ost LAD lesion 20% stenosed  Dist LM to Ost LAD lesion is 20% stenosed.  Left Anterior Descending  Vessel is large.  Ost LAD to Prox LAD lesion 30% stenosed  Ost LAD to Prox LAD lesion is 30% stenosed.  Dist LAD lesion 70% stenosed  Dist LAD lesion is 70% stenosed.  Left Circumflex  Vessel is large.  Prox Cx to Mid Cx lesion 30% stenosed  Prox Cx to Mid Cx lesion is 30% stenosed.  Third Obtuse Marginal Branch  Vessel is moderate in size. Vessel is angiographically normal.  3rd Mrg lesion 30% stenosed  3rd Mrg lesion is 30% stenosed.  Right Coronary Artery  Vessel is large.  Ost RCA lesion 40% stenosed  Ost RCA lesion is 40% stenosed.  Prox RCA to Dist RCA lesion 20% stenosed  Prox RCA to Dist RCA lesion is 20% stenosed.  First Right Posterolateral  1st RPLB lesion 20% stenosed  1st RPLB lesion is 20% stenosed.  Intervention   No interventions have been documented.  Right Heart   Right Heart Pressures LV EDP is normal.  Coronary Diagrams   Diagnostic Diagram       Implants    No implant documentation for this case.  MERGE Images   Show images for CARDIAC  CATHETERIZATION   Link to Procedure Log   Procedure Log    Hemo Data    Most Recent Value  Fick Cardiac Output 4.42 L/min  Fick Cardiac Output Index 2.52 (L/min)/BSA  Aortic Mean Gradient 38 mmHg  Aortic Peak Gradient 43 mmHg  Aortic Valve Area 0.70  Aortic Value Area Index 0.4 cm2/BSA  RA A Wave 3 mmHg  RA V Wave 0 mmHg  RA Mean 1 mmHg  RV Systolic Pressure 24 mmHg  RV Diastolic  Pressure 1 mmHg  RV EDP 2 mmHg  PA Systolic Pressure 27 mmHg  PA Diastolic Pressure 10 mmHg  PA Mean 18 mmHg  PW A Wave 14 mmHg  PW V Wave 12 mmHg  PW Mean 11 mmHg  AO Systolic Pressure 95 mmHg  AO Diastolic Pressure 51 mmHg  AO Mean 68 mmHg  LV Systolic Pressure 841 mmHg  LV Diastolic Pressure 4 mmHg  LV EDP 11 mmHg  AOp Systolic Pressure 660 mmHg  AOp Diastolic Pressure 55 mmHg  AOp Mean Pressure 74 mmHg  LVp Systolic Pressure 630 mmHg  LVp Diastolic Pressure 3 mmHg  LVp EDP Pressure 10 mmHg  QP/QS 1  TPVR Index 7.13 HRUI  TSVR Index 26.93 HRUI  PVR SVR Ratio 0.1  TPVR/TSVR Ratio 0.26     Cardiac TAVR CT  TECHNIQUE: The patient was scanned on a [employer]. A 120 kV retrospective scan was triggered in the descending thoracic aorta at 111 HU's. Gantry rotation speed was 250 msecs and collimation was .6 mm. No beta blockade or nitro were given. The 3D data set was reconstructed in 5% intervals of the R-R cycle. Systolic and diastolic phases were analyzed on a dedicated work station using MPR, MIP and VRT modes. The patient received 80 cc of contrast.  FINDINGS: Aortic Valve: Trileaflet aortic valve with severely thickened and calcified leaflets and severely restricted leaflets opening. There are mild calcifications extending into the LVOT under the left coronary cusp.  Aorta: There is mild ascending aortic aneurysm with maximum diameter 41 mm. Mild diffuse calcification in the thoracic aorta, no dissection.  Sinotubular Junction: 30 x 30 mm  Ascending Thoracic Aorta: 41 x 41 mm  Aortic Arch: 36 x 34 mm  Descending Thoracic Aorta: 29 x 29 mm  Sinus of Valsalva Measurements:  Non-coronary: 37 mm  Right -coronary: 34 mm  Left -coronary: 38 mm  Coronary Artery Height above Annulus:  Left Main: 15 mm  Right Coronary: 19 mm  Virtual Basal Annulus Measurements:  Maximum/Minimum Diameter: 28.1 x 24.0 mm  Mean Diameter:  24.8 mm  Perimeter: 80.2 mm  Area: 484 mm2  Optimum Fluoroscopic Angle for Delivery: LAO 8 CAU 5.  IMPRESSION: 1. Trileaflet aortic valve with severely thickened and calcified leaflets and severely restricted leaflets opening. There are mild calcifications extending into the LVOT under the left coronary cusp. Annular measurements suitable for delivery of a 26 mm Edwards-SAPIEN 3 valve.  2. Sufficient coronary to annulus distance.  3. Optimum Fluoroscopic Angle for Delivery:  LAO 8 CAU 5.  4. There is mild ascending aortic aneurysm with maximum diameter 41 mm.  5. No thrombus in the left atrial appendage.  Electronically Signed: By: Ena Dawley On: 04/06/2018 16:28   CT ANGIOGRAPHY CHEST, ABDOMEN AND PELVIS  TECHNIQUE: Multidetector CT imaging through the chest, abdomen and pelvis was performed using the standard protocol during bolus administration of intravenous contrast. Multiplanar reconstructed images and MIPs were obtained and reviewed to evaluate the vascular anatomy.  CONTRAST:  115mL ISOVUE-370 IOPAMIDOL (ISOVUE-370) INJECTION 76%  COMPARISON:  08/19/2012 chest radiograph.  FINDINGS: CTA CHEST FINDINGS  Cardiovascular: Top-normal heart size. No significant pericardial effusion/thickening. Left main and 3 vessel coronary atherosclerosis. Severe thickening and calcification of the aortic valve. Atherosclerotic thoracic aorta with ectatic 4.1 cm ascending thoracic aorta. Top-normal main pulmonary artery (3.2 cm diameter). No central pulmonary emboli.  Mediastinum/Nodes: No discrete thyroid nodules. Unremarkable esophagus. No pathologically enlarged axillary, mediastinal or hilar lymph nodes.  Lungs/Pleura: No pneumothorax. No pleural effusion. Moderate centrilobular and paraseptal emphysema with diffuse bronchial wall thickening. There is a 1.3 x 1.1 cm nodular opacity in the central right lower lobe (series 16/image 50) with  associated segmental airway narrowing. There is moderate focal varicoid bronchiectasis in the medial right lower lobe. Otherwise, no acute consolidative airspace disease, lung masses or additional significant pulmonary nodules.  Musculoskeletal: No aggressive appearing focal osseous lesions. Marked thoracic spondylosis.  CTA ABDOMEN AND PELVIS FINDINGS  Hepatobiliary: Normal liver with no liver mass. Normal gallbladder with no radiopaque cholelithiasis. No biliary ductal dilatation.  Pancreas: Normal, with no mass or duct dilation.  Spleen: Normal size. No mass.  Adrenals/Urinary Tract: Normal adrenals. Simple exophytic 3.7 cm renal cyst in the lateral lower right kidney. Simple exophytic 1.4 cm lateral lower left renal cyst. No hydronephrosis. Normal bladder.  Stomach/Bowel: Normal non-distended stomach. Normal caliber small bowel with no small bowel wall thickening. Normal appendix. Moderate diffuse colonic diverticulosis, with no definite large bowel wall thickening or significant pericolonic fat stranding.  Vascular/Lymphatic: Atherosclerotic abdominal aorta with 3.0 cm infrarenal abdominal aortic aneurysm. Patent renal veins. No pathologically enlarged lymph nodes in the abdomen or pelvis.  Reproductive: Mildly enlarged prostate with coarse nonspecific internal prostatic calcifications.  Other: No pneumoperitoneum, ascites or focal fluid collection.  Musculoskeletal: No aggressive appearing focal osseous lesions. Moderate lumbar spondylosis.  VASCULAR MEASUREMENTS PERTINENT TO TAVR:  AORTA:  Minimal Aortic Diameter-18.5 x 17.8 mm (infrarenal abdominal aorta on series 15/image 114)  Severity of Aortic Calcification-moderate  RIGHT PELVIS:  Right Common Iliac Artery -  Minimal Diameter-11.2 x 10.9 mm  Tortuosity-mild  Calcification-moderate  Right External Iliac Artery -  Minimal Diameter-8.4 x 6.0 mm  Tortuosity-moderate to  severe  Calcification-moderate  Right Common Femoral Artery -  Minimal Diameter-7.8 x 6.5 mm  Tortuosity-mild  Calcification-moderate  LEFT PELVIS:  Left Common Iliac Artery -  Minimal Diameter-9.9 x 9.7 mm  Tortuosity-mild  Calcification-moderate  Left External Iliac Artery -  Minimal Diameter-7.9 x 6.7 mm  Tortuosity-mild  Calcification-moderate, noting large soft plaque with associated mild focal fusiform ectasia in the mid left external iliac artery  Left Common Femoral Artery -  Minimal Diameter-8.3 x 7.2 mm  Tortuosity-mild  Calcification-moderate  Review of the MIP images confirms the above findings.  IMPRESSION: 1. Vascular findings and measurements pertinent to potential TAVR procedure, as detailed above. 2. Severe thickening and calcification of the aortic valve, compatible with the reported clinical history of severe aortic stenosis. 3. Indeterminate 1.3 cm nodular opacity in the central right lower lung lobe, with associated segmental airway narrowing. Findings may represent impacted mucous, with underlying neoplasm not excluded. Consider one of the following in 3 months in this high-risk individual: (a) repeat chest CT, (b) follow-up PET-CT, or (c) tissue sampling. This recommendation follows the consensus statement: Guidelines for Management of Incidental Pulmonary Nodules Detected on CT Images: From the Fleischner Society 2017; Radiology 2017; 284:228-243. 4. Left main and 3 vessel coronary atherosclerosis. 5. Infrarenal 3.1 cm abdominal aortic aneurysm.  Abdominal Aortic Aneurysm (ICD10-I71.9). Recommend follow-up aortic ultrasound in 3 years. This recommendation follows ACR consensus guidelines: White Paper of the ACR Incidental Findings Committee II on Vascular Findings. J Am Coll Radiol 2013; 10:789-794. 6. Aortic Atherosclerosis (ICD10-I70.0) and Emphysema (ICD10-J43.9). 7. Moderate colonic  diverticulosis.   Electronically Signed   By: Ilona Sorrel M.D.   On: 04/07/2018 13:13  Impression:  Patient has stage D severe symptomatic aortic stenosis.  He describes gradual progression of symptoms of worsening fatigue and exertional shortness of breath consistent with chronic diastolic congestive heart failure, New York Heart Association functional class II.  I have personally reviewed the patient's recent transthoracic echocardiogram, diagnostic cardiac catheterization, and CT angiograms.  Echocardiogram reveals presence of severe aortic stenosis.  Aortic valve appears trileaflet but may be functionally bicuspid.  There is severe thickening, calcification, and restricted leaflet mobility involving all 3 leaflets.  Peak velocity across the aortic valve measured 4.8 m/s corresponding to mean transvalvular gradient estimated greater than 50 mmHg.  Left ventricular systolic function remains preserved.  The patient does have left ventricular hypertrophy with diastolic dysfunction.  Diagnostic cardiac catheterization confirmed the presence of severe aortic stenosis and revealed moderate nonobstructive three-vessel coronary artery disease.  I agree the patient needs elective aortic valve replacement.  Although risks associated with conventional surgery would be acceptably low they may be somewhat elevated because of the patient's advanced age, long-standing tobacco use, and mild memory loss.  Cardiac-gated CTA of the heart reveals anatomical characteristics consistent with aortic stenosis suitable for treatment by transcatheter aortic valve replacement without any significant complicating features and CTA of the aorta and iliac vessels demonstrate what appears to be adequate pelvic vascular access to facilitate a transfemoral approach.  Under the circumstances I feel that transcatheter aortic valve replacement would be far less invasive and probably risks less risky than conventional surgery.  CT  angiography also revealed a 1.3 cm nodule in the right lower lobe which will require follow-up.  Finally, the patient has a very small infrarenal abdominal aortic aneurysm and mild fusiform enlargement of the ascending thoracic aorta.  Long-term follow-up imaging could be considered.    Plan:  The patient and his 2 daughters were counseled at length regarding treatment alternatives for management of severe symptomatic aortic stenosis. Alternative approaches such as conventional aortic valve replacement, transcatheter aortic valve replacement, and continued medical therapy without intervention were compared and contrasted at length.  The risks associated with conventional surgical aortic valve replacement were discussed in detail, as were expectations for post-operative convalescence.  Issues specific to transcatheter aortic valve replacement were discussed including questions about long term valve durability, the potential for paravalvular leak, possible increased risk of need for permanent pacemaker placement, and other technical complications related to the procedure itself.  Long-term prognosis with medical therapy was discussed. This discussion was placed in the context of the patient's own specific clinical presentation and past medical history.  All of their questions have been addressed.  The patient hopes to proceed with transcatheter aortic valve replacement as soon as practical.  We tentatively plan to proceed on April 21, 2018.  Following the decision to proceed with transcatheter aortic valve replacement, a discussion has been held regarding what types of management strategies would be attempted intraoperatively in the event of life-threatening complications, including whether or not the patient would be considered a candidate for the use of cardiopulmonary bypass and/or conversion to open sternotomy for attempted surgical intervention.  The patient would want aggressive measures if  necessary.   The patient has been advised of a variety of complications that might develop including but not limited to risks of death, stroke, paravalvular leak, aortic dissection or other major vascular complications, aortic annulus rupture, device embolization, cardiac rupture or perforation, mitral regurgitation, acute myocardial infarction, arrhythmia, heart block or bradycardia requiring permanent pacemaker placement, congestive heart failure, respiratory failure, renal failure, pneumonia, infection, other late complications related to structural valve deterioration or migration, or other complications that might ultimately cause a temporary or permanent loss of functional independence or other long term morbidity.  The patient provides full informed consent for the procedure as described and all questions were answered.    I spent in excess of 90 minutes during the conduct of this office consultation and >50% of this time involved direct face-to-face encounter with the patient for counseling and/or coordination of their care.     Valentina Gu. Roxy Manns, MD 04/13/2018 10:59 AM

## 2018-04-14 ENCOUNTER — Other Ambulatory Visit: Payer: Self-pay

## 2018-04-14 DIAGNOSIS — I350 Nonrheumatic aortic (valve) stenosis: Secondary | ICD-10-CM

## 2018-04-16 NOTE — Pre-Procedure Instructions (Signed)
Travis Murphy  04/16/2018      [id number] - [place], [place] - [address] AT [address] [place] [address] Phone: [phone] Fax: [phone]    Your procedure is scheduled on April 21, 2018.  Report to [facility] Admitting at 1030 AM.  Call this number if you have problems the morning of surgery:  [phone]   Remember:  Do not eat or drink after midnight.    Take these medicines the morning of surgery with A SIP OF WATER (none)  Follow your surgeon's instructions on when to hold/resume aspirin.  If no instructions were given call the office to determine how they would like to you take aspirin   7 days prior to surgery STOP taking any Aleve, Naproxen, Ibuprofen, Motrin, Advil, Goody's, BC's, all herbal medications, fish oil, and all vitamins   Do not wear jewelry  Do not wear lotions, powders, or colognes, or deodorant.  Men may shave face and neck.  Do not bring valuables to the hospital.  [facility] is not responsible for any belongings or valuables.  Contacts, dentures or bridgework may not be worn into surgery.  Leave your suitcase in the car.  After surgery it may be brought to your room.  For patients admitted to the hospital, discharge time will be determined by your treatment team.  Patients discharged the day of surgery will not be allowed to drive home.    [place]- Preparing For Surgery  Before surgery, you can play an important role. Because skin is not sterile, your skin needs to be as free of germs as possible. You can reduce the number of germs on your skin by washing with CHG (chlorahexidine gluconate) Soap before surgery.  CHG is an antiseptic cleaner which kills germs and bonds with the skin to continue killing germs even after washing.    Oral Hygiene is also important to reduce your risk of infection.  Remember - BRUSH YOUR TEETH THE MORNING OF SURGERY WITH YOUR REGULAR TOOTHPASTE  Please  do not use if you have an allergy to CHG or antibacterial soaps. If your skin becomes reddened/irritated stop using the CHG.  Do not shave (including legs and underarms) for at least 48 hours prior to first CHG shower. It is OK to shave your face.  Please follow these instructions carefully.   1. Shower the NIGHT BEFORE SURGERY and the MORNING OF SURGERY with CHG.   2. If you chose to wash your hair, wash your hair first as usual with your normal shampoo.  3. After you shampoo, rinse your hair and body thoroughly to remove the shampoo.  4. Use CHG as you would any other liquid soap. You can apply CHG directly to the skin and wash gently with a scrungie or a clean washcloth.   5. Apply the CHG Soap to your body ONLY FROM THE NECK DOWN.  Do not use on open wounds or open sores. Avoid contact with your eyes, ears, mouth and genitals (private parts). Wash Face and genitals (private parts)  with your normal soap.  6. Wash thoroughly, paying special attention to the area where your surgery will be performed.  7. Thoroughly rinse your body with warm water from the neck down.  8. DO NOT shower/wash with your normal soap after using and rinsing off the CHG Soap.  9. Pat yourself dry with a CLEAN TOWEL.  10. Wear CLEAN PAJAMAS to bed the night before  surgery, wear comfortable clothes the morning of surgery  11. Place CLEAN SHEETS on your bed the night of your first shower and DO NOT SLEEP WITH PETS.  Day of Surgery:  Do not apply any deodorants/lotions.  Please wear clean clothes to the hospital/surgery center.   Remember to brush your teeth WITH YOUR REGULAR TOOTHPASTE.  Please read over the following fact sheets that you were given. Pain Booklet, Coughing and Deep Breathing, MRSA Information and Surgical Site Infection Prevention

## 2018-04-17 ENCOUNTER — Ambulatory Visit (HOSPITAL_COMMUNITY)
Admission: RE | Admit: 2018-04-17 | Discharge: 2018-04-17 | Disposition: A | Discharge status: Home / Self Care | Payer: Medicare Other | Source: Ambulatory Visit | Attending: Cardiovascular Disease | Admitting: Cardiovascular Disease

## 2018-04-17 ENCOUNTER — Encounter (HOSPITAL_COMMUNITY): Payer: Self-pay

## 2018-04-17 ENCOUNTER — Encounter (HOSPITAL_COMMUNITY)
Admission: RE | Admit: 2018-04-17 | Discharge: 2018-04-17 | Disposition: A | Discharge status: Home / Self Care | Payer: Medicare Other | Source: Ambulatory Visit | Attending: Cardiovascular Disease | Admitting: Cardiovascular Disease

## 2018-04-17 ENCOUNTER — Other Ambulatory Visit: Payer: Self-pay

## 2018-04-17 DIAGNOSIS — I350 Nonrheumatic aortic (valve) stenosis: Secondary | ICD-10-CM

## 2018-04-17 DIAGNOSIS — J439 Emphysema, unspecified: Secondary | ICD-10-CM | POA: Insufficient documentation

## 2018-04-17 DIAGNOSIS — Z01812 Encounter for preprocedural laboratory examination: Secondary | ICD-10-CM | POA: Diagnosis not present

## 2018-04-17 DIAGNOSIS — I700 Atherosclerosis of aorta: Secondary | ICD-10-CM | POA: Diagnosis not present

## 2018-04-17 DIAGNOSIS — R9431 Abnormal electrocardiogram [ECG] [EKG]: Secondary | ICD-10-CM | POA: Insufficient documentation

## 2018-04-17 DIAGNOSIS — Z01810 Encounter for preprocedural cardiovascular examination: Secondary | ICD-10-CM | POA: Insufficient documentation

## 2018-04-17 HISTORY — DX: Dizziness and giddiness: R42

## 2018-04-17 LAB — TYPE AND SCREEN
ABO/RH(D): O POS
Antibody Screen: NEGATIVE

## 2018-04-17 LAB — URINALYSIS, ROUTINE W REFLEX MICROSCOPIC
BILIRUBIN URINE: NEGATIVE
GLUCOSE, UA: NEGATIVE mg/dL
Hgb urine dipstick: NEGATIVE
KETONES UR: NEGATIVE mg/dL
NITRITE: NEGATIVE
PH: 6 (ref 5.0–8.0)
Protein, ur: NEGATIVE mg/dL
Specific Gravity, Urine: 1.004 — ABNORMAL LOW (ref 1.005–1.030)

## 2018-04-17 LAB — CBC
HCT: 47.1 % (ref 39.0–52.0)
Hemoglobin: 15.7 g/dL (ref 13.0–17.0)
MCH: 32 pg (ref 26.0–34.0)
MCHC: 33.3 g/dL (ref 30.0–36.0)
MCV: 95.9 fL (ref 78.0–100.0)
PLATELETS: 152 10*3/uL (ref 150–400)
RBC: 4.91 MIL/uL (ref 4.22–5.81)
RDW: 13.1 % (ref 11.5–15.5)
WBC: 6.9 10*3/uL (ref 4.0–10.5)

## 2018-04-17 LAB — COMPREHENSIVE METABOLIC PANEL
ALT: 21 U/L (ref 0–44)
AST: 20 U/L (ref 15–41)
Albumin: 3.7 g/dL (ref 3.5–5.0)
Alkaline Phosphatase: 92 U/L (ref 38–126)
Anion gap: 10 (ref 5–15)
BUN: 10 mg/dL (ref 8–23)
CALCIUM: 8.9 mg/dL (ref 8.9–10.3)
CHLORIDE: 109 mmol/L (ref 98–111)
CO2: 22 mmol/L (ref 22–32)
Creatinine, Ser: 0.79 mg/dL (ref 0.61–1.24)
GFR calc Af Amer: >60 mL/min (ref >60)
GFR calc non Af Amer: >60 mL/min (ref >60)
Glucose, Bld: 133 mg/dL — ABNORMAL HIGH (ref 70–99)
POTASSIUM: 4.1 mmol/L (ref 3.5–5.1)
Sodium: 141 mmol/L (ref 135–145)
TOTAL PROTEIN: 6.8 g/dL (ref 6.5–8.1)
Total Bilirubin: 1.8 mg/dL — ABNORMAL HIGH (ref 0.3–1.2)

## 2018-04-17 LAB — APTT: APTT: 40 s — AB (ref 24–36)

## 2018-04-17 LAB — BLOOD GAS, ARTERIAL
ACID-BASE EXCESS: 1.8 mmol/L (ref 0.0–2.0)
Bicarbonate: 25.9 mmol/L (ref 20.0–28.0)
DRAWN BY: 42180
FIO2: 21
O2 SAT: 97 %
PATIENT TEMPERATURE: 98.6
PCO2 ART: 40.6 mmHg (ref 32.0–48.0)
pH, Arterial: 7.42 (ref 7.350–7.450)
pO2, Arterial: 83.7 mmHg (ref 83.0–108.0)

## 2018-04-17 LAB — HEMOGLOBIN A1C
Hgb A1c MFr Bld: 6.1 % — ABNORMAL HIGH (ref 4.8–5.6)
Mean Plasma Glucose: 128.37 mg/dL

## 2018-04-17 LAB — PROTIME-INR
INR: 1.09
Prothrombin Time: 14 seconds (ref 11.4–15.2)

## 2018-04-17 LAB — BRAIN NATRIURETIC PEPTIDE: B NATRIURETIC PEPTIDE 5: 352.3 pg/mL — AB (ref 0.0–100.0)

## 2018-04-17 LAB — SURGICAL PCR SCREEN
MRSA, PCR: NEGATIVE
STAPHYLOCOCCUS AUREUS: NEGATIVE

## 2018-04-17 LAB — ABO/RH: ABO/RH(D): O POS

## 2018-04-17 NOTE — Progress Notes (Signed)
PCP - Asencion Noble Cardiologist -  Dr. Domenic Polite [facility]  Chest x-ray - 04/17/2018  EKG - 04/17/2018  Stress Test - 6/31/19 ECHO - 6/31/19 Cardiac Cath - 03/25/18  Sleep Study - no sleep study done, Stop Bang positive, results sent to PCP  Pt denies Diabetes- A1c checked today per order  Aspirin Instructions: continue taking Aspirin. No medications to be taken the day of surgery  Anesthesia review: heart tests  Patient denies shortness of breath, fever, cough and chest pain at PAT appointment   Patient verbalized understanding of instructions that were given to them at the PAT appointment. Patient was also instructed that they will need to review over the PAT instructions again at home before surgery.

## 2018-04-17 NOTE — Progress Notes (Signed)
   04/17/18 0802  OBSTRUCTIVE SLEEP APNEA  Have you ever been diagnosed with sleep apnea through a sleep study? No  Do you snore loudly (loud enough to be heard through closed doors)?  1  Do you often feel tired, fatigued, or sleepy during the daytime (such as falling asleep during driving or talking to someone)? 1  Has anyone observed you stop breathing during your sleep? 1  Do you have, or are you being treated for high blood pressure? 1  BMI more than 35 kg/m2? 0  Age > 50 (1-yes) 1  Neck circumference greater than:Male 16 inches or larger, Male 17inches or larger? 0  Male Gender (Yes=1) 1  Obstructive Sleep Apnea Score 6  Score 5 or greater  Results sent to PCP

## 2018-04-20 MED ORDER — DEXMEDETOMIDINE HCL IN NACL 400 MCG/100ML IV SOLN
0.1000 ug/kg/h | INTRAVENOUS | Status: DC
  Filled 2018-04-20: qty 100

## 2018-04-20 MED ORDER — NOREPINEPHRINE 4 MG/250ML-% IV SOLN
0.0000 ug/min | INTRAVENOUS | Status: DC
  Filled 2018-04-20: qty 250

## 2018-04-20 MED ORDER — POTASSIUM CHLORIDE 2 MEQ/ML IV SOLN
80.0000 meq | INTRAVENOUS | Status: DC
  Filled 2018-04-20: qty 40

## 2018-04-20 MED ORDER — SODIUM CHLORIDE 0.9 % IV SOLN
1.5000 g | INTRAVENOUS | Status: AC
  Administered 2018-04-21: 1.5 g via INTRAVENOUS
  Filled 2018-04-20: qty 1.5

## 2018-04-20 MED ORDER — SODIUM CHLORIDE 0.9 % IV SOLN
INTRAVENOUS | Status: DC
  Filled 2018-04-20: qty 1

## 2018-04-20 MED ORDER — VANCOMYCIN HCL 10 G IV SOLR
1250.0000 mg | INTRAVENOUS | Status: AC
  Administered 2018-04-21: 1250 mg via INTRAVENOUS
  Filled 2018-04-20: qty 1250

## 2018-04-20 MED ORDER — MAGNESIUM SULFATE 50 % IJ SOLN
40.0000 meq | INTRAMUSCULAR | Status: DC
  Filled 2018-04-20: qty 9.85

## 2018-04-20 MED ORDER — SODIUM CHLORIDE 0.9 % IV SOLN
INTRAVENOUS | Status: DC
  Filled 2018-04-20: qty 30

## 2018-04-20 MED ORDER — DOPAMINE-DEXTROSE 3.2-5 MG/ML-% IV SOLN
0.0000 ug/kg/min | INTRAVENOUS | Status: DC
  Filled 2018-04-20: qty 250

## 2018-04-20 MED ORDER — EPINEPHRINE PF 1 MG/ML IJ SOLN
0.0000 ug/min | INTRAVENOUS | Status: DC
  Filled 2018-04-20: qty 4

## 2018-04-20 MED ORDER — NITROGLYCERIN IN D5W 200-5 MCG/ML-% IV SOLN
2.0000 ug/min | INTRAVENOUS | Status: DC
  Filled 2018-04-20: qty 250

## 2018-04-20 MED ORDER — SODIUM CHLORIDE 0.9 % IV SOLN
30.0000 ug/min | INTRAVENOUS | Status: DC
  Filled 2018-04-20: qty 2

## 2018-04-20 MED ORDER — NOREPINEPHRINE 4 MG/250ML-% IV SOLN
0.0000 ug/min | INTRAVENOUS | Status: AC
  Administered 2018-04-21: 2 ug/min via INTRAVENOUS
  Filled 2018-04-20: qty 250

## 2018-04-20 MED ORDER — DEXMEDETOMIDINE HCL IN NACL 400 MCG/100ML IV SOLN
0.1000 ug/kg/h | INTRAVENOUS | Status: AC
  Administered 2018-04-21: 1 ug/kg/h via INTRAVENOUS
  Filled 2018-04-20: qty 100

## 2018-04-21 ENCOUNTER — Inpatient Hospital Stay (HOSPITAL_COMMUNITY): Payer: Medicare Other | Admitting: Physician Assistant

## 2018-04-21 ENCOUNTER — Ambulatory Visit (HOSPITAL_COMMUNITY): Payer: Medicare Other

## 2018-04-21 ENCOUNTER — Encounter (HOSPITAL_COMMUNITY)
Admission: RE | Disposition: A | Discharge status: Home / Self Care | Payer: Self-pay | Source: Ambulatory Visit | Attending: Thoracic Surgery (Cardiothoracic Vascular Surgery)

## 2018-04-21 ENCOUNTER — Inpatient Hospital Stay (HOSPITAL_COMMUNITY): Payer: Medicare Other

## 2018-04-21 ENCOUNTER — Inpatient Hospital Stay (HOSPITAL_COMMUNITY)
Admission: RE | Admit: 2018-04-21 | Discharge: 2018-04-22 | DRG: 267 | Disposition: A | Discharge status: Home / Self Care | Payer: Medicare Other | Source: Ambulatory Visit | Attending: Thoracic Surgery (Cardiothoracic Vascular Surgery) | Admitting: Thoracic Surgery (Cardiothoracic Vascular Surgery)

## 2018-04-21 ENCOUNTER — Inpatient Hospital Stay (HOSPITAL_COMMUNITY): Payer: Medicare Other | Admitting: Certified Registered Nurse Anesthetist

## 2018-04-21 ENCOUNTER — Encounter (HOSPITAL_COMMUNITY): Payer: Self-pay | Admitting: *Deleted

## 2018-04-21 ENCOUNTER — Other Ambulatory Visit: Payer: Self-pay

## 2018-04-21 ENCOUNTER — Other Ambulatory Visit (HOSPITAL_COMMUNITY): Payer: Self-pay | Admitting: *Deleted

## 2018-04-21 DIAGNOSIS — J432 Centrilobular emphysema: Secondary | ICD-10-CM | POA: Diagnosis present

## 2018-04-21 DIAGNOSIS — I10 Essential (primary) hypertension: Secondary | ICD-10-CM | POA: Diagnosis present

## 2018-04-21 DIAGNOSIS — N400 Enlarged prostate without lower urinary tract symptoms: Secondary | ICD-10-CM | POA: Diagnosis present

## 2018-04-21 DIAGNOSIS — Z841 Family history of disorders of kidney and ureter: Secondary | ICD-10-CM

## 2018-04-21 DIAGNOSIS — I350 Nonrheumatic aortic (valve) stenosis: Secondary | ICD-10-CM | POA: Diagnosis not present

## 2018-04-21 DIAGNOSIS — Z952 Presence of prosthetic heart valve: Secondary | ICD-10-CM

## 2018-04-21 DIAGNOSIS — R0989 Other specified symptoms and signs involving the circulatory and respiratory systems: Secondary | ICD-10-CM | POA: Diagnosis not present

## 2018-04-21 DIAGNOSIS — I714 Abdominal aortic aneurysm, without rupture: Secondary | ICD-10-CM | POA: Diagnosis not present

## 2018-04-21 DIAGNOSIS — Z86010 Personal history of colonic polyps: Secondary | ICD-10-CM | POA: Diagnosis not present

## 2018-04-21 DIAGNOSIS — K5730 Diverticulosis of large intestine without perforation or abscess without bleeding: Secondary | ICD-10-CM | POA: Diagnosis not present

## 2018-04-21 DIAGNOSIS — Z8249 Family history of ischemic heart disease and other diseases of the circulatory system: Secondary | ICD-10-CM

## 2018-04-21 DIAGNOSIS — I712 Thoracic aortic aneurysm, without rupture: Secondary | ICD-10-CM | POA: Diagnosis present

## 2018-04-21 DIAGNOSIS — R911 Solitary pulmonary nodule: Secondary | ICD-10-CM | POA: Diagnosis present

## 2018-04-21 DIAGNOSIS — Z823 Family history of stroke: Secondary | ICD-10-CM

## 2018-04-21 DIAGNOSIS — Z006 Encounter for examination for normal comparison and control in clinical research program: Secondary | ICD-10-CM | POA: Diagnosis not present

## 2018-04-21 DIAGNOSIS — Z9841 Cataract extraction status, right eye: Secondary | ICD-10-CM | POA: Diagnosis not present

## 2018-04-21 DIAGNOSIS — Z87891 Personal history of nicotine dependence: Secondary | ICD-10-CM | POA: Diagnosis not present

## 2018-04-21 DIAGNOSIS — J438 Other emphysema: Secondary | ICD-10-CM | POA: Diagnosis not present

## 2018-04-21 DIAGNOSIS — E785 Hyperlipidemia, unspecified: Secondary | ICD-10-CM | POA: Diagnosis present

## 2018-04-21 DIAGNOSIS — I2510 Atherosclerotic heart disease of native coronary artery without angina pectoris: Secondary | ICD-10-CM | POA: Diagnosis not present

## 2018-04-21 DIAGNOSIS — F17210 Nicotine dependence, cigarettes, uncomplicated: Secondary | ICD-10-CM | POA: Diagnosis present

## 2018-04-21 DIAGNOSIS — I5030 Unspecified diastolic (congestive) heart failure: Secondary | ICD-10-CM | POA: Diagnosis not present

## 2018-04-21 DIAGNOSIS — Z720 Tobacco use: Secondary | ICD-10-CM | POA: Diagnosis present

## 2018-04-21 HISTORY — DX: Presence of prosthetic heart valve: Z95.2

## 2018-04-21 HISTORY — PX: INTRAOPERATIVE TRANSTHORACIC ECHOCARDIOGRAM: SHX6523

## 2018-04-21 HISTORY — PX: TRANSCATHETER AORTIC VALVE REPLACEMENT, TRANSFEMORAL: SHX6400

## 2018-04-21 HISTORY — DX: Unspecified osteoarthritis, unspecified site: M19.90

## 2018-04-21 LAB — POCT I-STAT, CHEM 8
BUN: 9 mg/dL (ref 8–23)
BUN: 9 mg/dL (ref 8–23)
BUN: 9 mg/dL (ref 8–23)
BUN: 10 mg/dL (ref 8–23)
CALCIUM ION: 1.17 mmol/L (ref 1.15–1.40)
CALCIUM ION: 1.15 mmol/L (ref 1.15–1.40)
CALCIUM ION: 1.2 mmol/L (ref 1.15–1.40)
CHLORIDE: 108 mmol/L (ref 98–111)
CHLORIDE: 105 mmol/L (ref 98–111)
CREATININE: 0.6 mg/dL — AB (ref 0.61–1.24)
CREATININE: 0.7 mg/dL (ref 0.61–1.24)
Calcium, Ion: 1.19 mmol/L (ref 1.15–1.40)
Chloride: 105 mmol/L (ref 98–111)
Chloride: 106 mmol/L (ref 98–111)
Creatinine, Ser: 0.6 mg/dL — ABNORMAL LOW (ref 0.61–1.24)
Creatinine, Ser: 0.7 mg/dL (ref 0.61–1.24)
GLUCOSE: 132 mg/dL — AB (ref 70–99)
Glucose, Bld: 101 mg/dL — ABNORMAL HIGH (ref 70–99)
Glucose, Bld: 130 mg/dL — ABNORMAL HIGH (ref 70–99)
Glucose, Bld: 129 mg/dL — ABNORMAL HIGH (ref 70–99)
HCT: 37 % — ABNORMAL LOW (ref 39.0–52.0)
HCT: 39 % (ref 39.0–52.0)
HCT: 37 % — ABNORMAL LOW (ref 39.0–52.0)
HCT: 40 % (ref 39.0–52.0)
HEMOGLOBIN: 12.6 g/dL — AB (ref 13.0–17.0)
HEMOGLOBIN: 13.3 g/dL (ref 13.0–17.0)
Hemoglobin: 12.6 g/dL — ABNORMAL LOW (ref 13.0–17.0)
Hemoglobin: 13.6 g/dL (ref 13.0–17.0)
Potassium: 3.4 mmol/L — ABNORMAL LOW (ref 3.5–5.1)
Potassium: 3.6 mmol/L (ref 3.5–5.1)
Potassium: 3.8 mmol/L (ref 3.5–5.1)
Potassium: 3.8 mmol/L (ref 3.5–5.1)
SODIUM: 143 mmol/L (ref 135–145)
SODIUM: 144 mmol/L (ref 135–145)
Sodium: 145 mmol/L (ref 135–145)
Sodium: 145 mmol/L (ref 135–145)
TCO2: 26 mmol/L (ref 22–32)
TCO2: 26 mmol/L (ref 22–32)
TCO2: 23 mmol/L (ref 22–32)
TCO2: 24 mmol/L (ref 22–32)

## 2018-04-21 SURGERY — IMPLANTATION, AORTIC VALVE, TRANSCATHETER, FEMORAL APPROACH
Anesthesia: Monitor Anesthesia Care | Site: Chest

## 2018-04-21 MED ORDER — SODIUM CHLORIDE 0.9 % IV SOLN
INTRAVENOUS | Status: AC
  Filled 2018-04-21 (×2): qty 1.2

## 2018-04-21 MED ORDER — HEPARIN SODIUM (PORCINE) 1000 UNIT/ML IJ SOLN
INTRAMUSCULAR | Status: DC | PRN
  Administered 2018-04-21: 10000 [IU] via INTRAVENOUS

## 2018-04-21 MED ORDER — NITROGLYCERIN IN D5W 200-5 MCG/ML-% IV SOLN: 0.0000 ug/min | INTRAVENOUS | Status: DC

## 2018-04-21 MED ORDER — LACTATED RINGERS IV SOLN
INTRAVENOUS | Status: DC | PRN
  Administered 2018-04-21: 12:00:00 via INTRAVENOUS

## 2018-04-21 MED ORDER — IPRATROPIUM-ALBUTEROL 0.5-2.5 (3) MG/3ML IN SOLN
RESPIRATORY_TRACT | Status: AC
  Filled 2018-04-21: qty 3

## 2018-04-21 MED ORDER — SODIUM CHLORIDE 0.9 % IV SOLN: 250.0000 mL | INTRAVENOUS | Status: DC | PRN

## 2018-04-21 MED ORDER — SODIUM CHLORIDE 0.9 % IV SOLN
INTRAVENOUS | Status: AC
  Filled 2018-04-21: qty 1.2

## 2018-04-21 MED ORDER — METOPROLOL TARTRATE 5 MG/5ML IV SOLN: 2.5000 mg | INTRAVENOUS | Status: DC | PRN

## 2018-04-21 MED ORDER — ENSURE ENLIVE PO LIQD: 237.0000 mL | Freq: Two times a day (BID) | ORAL | Status: DC

## 2018-04-21 MED ORDER — CHLORHEXIDINE GLUCONATE 0.12 % MT SOLN: 15.0000 mL | Freq: Once | OROMUCOSAL | Status: DC

## 2018-04-21 MED ORDER — LIDOCAINE HCL (PF) 1 % IJ SOLN
INTRAMUSCULAR | Status: AC
  Filled 2018-04-21: qty 30

## 2018-04-21 MED ORDER — ASPIRIN 81 MG PO CHEW
81.0000 mg | CHEWABLE_TABLET | Freq: Every day | ORAL | Status: DC
  Administered 2018-04-22: 81 mg via ORAL
  Filled 2018-04-21: qty 1

## 2018-04-21 MED ORDER — SODIUM CHLORIDE 0.9 % IV SOLN
INTRAVENOUS | Status: DC | PRN
  Administered 2018-04-21: 12:00:00 via INTRAVENOUS

## 2018-04-21 MED ORDER — FENTANYL CITRATE (PF) 100 MCG/2ML IJ SOLN
INTRAMUSCULAR | Status: DC | PRN
  Administered 2018-04-21: 50 ug via INTRAVENOUS

## 2018-04-21 MED ORDER — IPRATROPIUM-ALBUTEROL 0.5-2.5 (3) MG/3ML IN SOLN
3.0000 mL | Freq: Once | RESPIRATORY_TRACT | Status: AC
  Administered 2018-04-21: 3 mL via RESPIRATORY_TRACT
  Filled 2018-04-21: qty 3

## 2018-04-21 MED ORDER — SODIUM CHLORIDE 0.9 % IV SOLN: INTRAVENOUS | Status: AC

## 2018-04-21 MED ORDER — SODIUM CHLORIDE 0.9% FLUSH: 3.0000 mL | INTRAVENOUS | Status: DC | PRN

## 2018-04-21 MED ORDER — CHLORHEXIDINE GLUCONATE 0.12 % MT SOLN
OROMUCOSAL | Status: AC
  Administered 2018-04-21: 15 mL
  Filled 2018-04-21: qty 15

## 2018-04-21 MED ORDER — IPRATROPIUM-ALBUTEROL 0.5-2.5 (3) MG/3ML IN SOLN
RESPIRATORY_TRACT | Status: AC
  Administered 2018-04-21: 3 mL via RESPIRATORY_TRACT
  Filled 2018-04-21: qty 3

## 2018-04-21 MED ORDER — SODIUM CHLORIDE 0.9% FLUSH: 3.0000 mL | Freq: Two times a day (BID) | INTRAVENOUS | Status: DC

## 2018-04-21 MED ORDER — VANCOMYCIN HCL IN DEXTROSE 1-5 GM/200ML-% IV SOLN
1000.0000 mg | Freq: Once | INTRAVENOUS | Status: AC
  Administered 2018-04-21: 1000 mg via INTRAVENOUS
  Filled 2018-04-21: qty 200

## 2018-04-21 MED ORDER — TRAMADOL HCL 50 MG PO TABS: 50.0000 mg | ORAL_TABLET | ORAL | Status: DC | PRN

## 2018-04-21 MED ORDER — SODIUM CHLORIDE 0.9 % IV SOLN
INTRAVENOUS | Status: DC
  Administered 2018-04-21: 11:00:00 via INTRAVENOUS

## 2018-04-21 MED ORDER — MORPHINE SULFATE (PF) 2 MG/ML IV SOLN: 2.0000 mg | INTRAVENOUS | Status: DC | PRN

## 2018-04-21 MED ORDER — ACETAMINOPHEN 650 MG RE SUPP: 650.0000 mg | Freq: Four times a day (QID) | RECTAL | Status: DC | PRN

## 2018-04-21 MED ORDER — PROTAMINE SULFATE 10 MG/ML IV SOLN
INTRAVENOUS | Status: DC | PRN
  Administered 2018-04-21: 100 mg via INTRAVENOUS

## 2018-04-21 MED ORDER — CHLORHEXIDINE GLUCONATE 4 % EX LIQD: 60.0000 mL | Freq: Once | CUTANEOUS | Status: DC

## 2018-04-21 MED ORDER — SODIUM CHLORIDE 0.9 % IV SOLN
INTRAVENOUS | Status: DC | PRN
  Administered 2018-04-21: 1500 mL

## 2018-04-21 MED ORDER — CLOPIDOGREL BISULFATE 75 MG PO TABS
75.0000 mg | ORAL_TABLET | Freq: Every day | ORAL | Status: DC
  Administered 2018-04-22: 75 mg via ORAL
  Filled 2018-04-21: qty 1

## 2018-04-21 MED ORDER — SODIUM CHLORIDE 0.9 % IV BOLUS: 500.0000 mL | Freq: Once | INTRAVENOUS | Status: DC

## 2018-04-21 MED ORDER — CHLORHEXIDINE GLUCONATE 4 % EX LIQD: 30.0000 mL | CUTANEOUS | Status: DC

## 2018-04-21 MED ORDER — MIDAZOLAM HCL 5 MG/5ML IJ SOLN
INTRAMUSCULAR | Status: DC | PRN
  Administered 2018-04-21: 1 mg via INTRAVENOUS

## 2018-04-21 MED ORDER — PROPOFOL 500 MG/50ML IV EMUL
INTRAVENOUS | Status: DC | PRN
  Administered 2018-04-21: 10 ug/kg/min via INTRAVENOUS

## 2018-04-21 MED ORDER — ONDANSETRON HCL 4 MG/2ML IJ SOLN: 4.0000 mg | Freq: Four times a day (QID) | INTRAMUSCULAR | Status: DC | PRN

## 2018-04-21 MED ORDER — PHENYLEPHRINE HCL-NACL 20-0.9 MG/250ML-% IV SOLN: 0.0000 ug/min | INTRAVENOUS | Status: DC

## 2018-04-21 MED ORDER — IODIXANOL 320 MG/ML IV SOLN
INTRAVENOUS | Status: DC | PRN
  Administered 2018-04-21: 40 mL via INTRA_ARTERIAL

## 2018-04-21 MED ORDER — EPHEDRINE SULFATE 50 MG/ML IJ SOLN
INTRAMUSCULAR | Status: DC | PRN
  Administered 2018-04-21 (×2): 5 mg via INTRAVENOUS

## 2018-04-21 MED ORDER — OXYCODONE HCL 5 MG PO TABS: 5.0000 mg | ORAL_TABLET | ORAL | Status: DC | PRN

## 2018-04-21 MED ORDER — SODIUM CHLORIDE 0.9 % IV SOLN
1.5000 g | Freq: Two times a day (BID) | INTRAVENOUS | Status: DC
  Administered 2018-04-21 – 2018-04-22 (×2): 1.5 g via INTRAVENOUS
  Filled 2018-04-21 (×4): qty 1.5

## 2018-04-21 MED ORDER — 0.9 % SODIUM CHLORIDE (POUR BTL) OPTIME
TOPICAL | Status: DC | PRN
  Administered 2018-04-21: 2000 mL

## 2018-04-21 MED ORDER — FENTANYL CITRATE (PF) 100 MCG/2ML IJ SOLN
INTRAMUSCULAR | Status: AC
  Filled 2018-04-21: qty 2

## 2018-04-21 MED ORDER — MIDAZOLAM HCL 2 MG/2ML IJ SOLN
INTRAMUSCULAR | Status: AC
  Filled 2018-04-21: qty 2

## 2018-04-21 MED ORDER — ACETAMINOPHEN 325 MG PO TABS: 650.0000 mg | ORAL_TABLET | Freq: Four times a day (QID) | ORAL | Status: DC | PRN

## 2018-04-21 MED ORDER — LIDOCAINE HCL 1 % IJ SOLN
INTRAMUSCULAR | Status: DC | PRN
  Administered 2018-04-21: 5 mL

## 2018-04-21 SURGICAL SUPPLY — 91 items
ADH SKN CLS APL DERMABOND .7 (GAUZE/BANDAGES/DRESSINGS) ×2
BAG DECANTER FOR FLEXI CONT (MISCELLANEOUS) IMPLANT
BAG SNAP BAND KOVER 36X36 (MISCELLANEOUS) ×4 IMPLANT
BLADE CLIPPER SURG (BLADE) IMPLANT
BLADE OSCILLATING /SAGITTAL (BLADE) IMPLANT
BLADE STERNUM SYSTEM 6 (BLADE) IMPLANT
CABLE ADAPT CONN TEMP 6FT (ADAPTER) ×4 IMPLANT
CANNULA FEM VENOUS REMOTE 22FR (CANNULA) IMPLANT
CANNULA OPTISITE PERFUSION 16F (CANNULA) IMPLANT
CANNULA OPTISITE PERFUSION 18F (CANNULA) IMPLANT
CATH DIAG EXPO 6F VENT PIG 145 (CATHETERS) ×8 IMPLANT
CATH EXPO 5FR AL1 (CATHETERS) IMPLANT
CATH FELDMAN FA1 6F 100CM (CATHETERS) ×3 IMPLANT
CATH INFINITI 6F AL2 (CATHETERS) ×3 IMPLANT
CATH INFINITI 6F MPB2 (CATHETERS) ×3 IMPLANT
CATH S G BIP PACING (SET/KITS/TRAYS/PACK) ×4 IMPLANT
CLIP VESOCCLUDE MED 24/CT (CLIP) IMPLANT
CLIP VESOCCLUDE SM WIDE 24/CT (CLIP) IMPLANT
CONT SPEC 4OZ CLIKSEAL STRL BL (MISCELLANEOUS) ×8 IMPLANT
COVER BACK TABLE 24X17X13 BIG (DRAPES) IMPLANT
COVER BACK TABLE 80X110 HD (DRAPES) ×8 IMPLANT
COVER DOME SNAP 22 D (MISCELLANEOUS) IMPLANT
CRADLE DONUT ADULT HEAD (MISCELLANEOUS) ×4 IMPLANT
DERMABOND ADVANCED (GAUZE/BANDAGES/DRESSINGS) ×2
DERMABOND ADVANCED .7 DNX12 (GAUZE/BANDAGES/DRESSINGS) ×2 IMPLANT
DEVICE CLOSURE PERCLS PRGLD 6F (VASCULAR PRODUCTS) ×4 IMPLANT
DRAPE INCISE IOBAN 66X45 STRL (DRAPES) IMPLANT
DRSG TEGADERM 4X4.75 (GAUZE/BANDAGES/DRESSINGS) ×8 IMPLANT
ELECT CAUTERY BLADE 6.4 (BLADE) IMPLANT
ELECT REM PT RETURN 9FT ADLT (ELECTROSURGICAL) ×8
ELECTRODE REM PT RTRN 9FT ADLT (ELECTROSURGICAL) ×4 IMPLANT
FELT TEFLON 6X6 (MISCELLANEOUS) IMPLANT
FEMORAL VENOUS CANN RAP (CANNULA) IMPLANT
GAUZE SPONGE 4X4 12PLY STRL (GAUZE/BANDAGES/DRESSINGS) ×4 IMPLANT
GAUZE SPONGE 4X4 12PLY STRL LF (GAUZE/BANDAGES/DRESSINGS) ×3 IMPLANT
GLOVE BIO SURGEON STRL SZ7.5 (GLOVE) ×4 IMPLANT
GLOVE BIO SURGEON STRL SZ8 (GLOVE) IMPLANT
GLOVE EUDERMIC 7 POWDERFREE (GLOVE) IMPLANT
GLOVE ORTHO TXT STRL SZ7.5 (GLOVE) ×3 IMPLANT
GOWN STRL REUS W/ TWL LRG LVL3 (GOWN DISPOSABLE) ×3 IMPLANT
GOWN STRL REUS W/ TWL XL LVL3 (GOWN DISPOSABLE) ×4 IMPLANT
GOWN STRL REUS W/TWL LRG LVL3 (GOWN DISPOSABLE) ×12
GOWN STRL REUS W/TWL XL LVL3 (GOWN DISPOSABLE) ×12
GUIDEWIRE SAFE TJ AMPLATZ EXST (WIRE) ×4 IMPLANT
GUIDEWIRE STRAIGHT .035 260CM (WIRE) ×4 IMPLANT
INSERT FOGARTY SM (MISCELLANEOUS) IMPLANT
KIT BASIN OR (CUSTOM PROCEDURE TRAY) ×4 IMPLANT
KIT DILATOR VASC 18G NDL (KITS) IMPLANT
KIT HEART LEFT (KITS) ×4 IMPLANT
KIT SUCTION CATH 14FR (SUCTIONS) IMPLANT
KIT TURNOVER KIT B (KITS) ×4 IMPLANT
LOOP VESSEL MAXI BLUE (MISCELLANEOUS) IMPLANT
LOOP VESSEL MINI RED (MISCELLANEOUS) IMPLANT
NDL PERC 18GX7CM (NEEDLE) ×1 IMPLANT
NEEDLE 22X1 1/2 (OR ONLY) (NEEDLE) IMPLANT
NEEDLE PERC 18GX7CM (NEEDLE) ×8 IMPLANT
NS IRRIG 1000ML POUR BTL (IV SOLUTION) ×12 IMPLANT
PACK ENDOVASCULAR (PACKS) ×4 IMPLANT
PAD ARMBOARD 7.5X6 YLW CONV (MISCELLANEOUS) ×8 IMPLANT
PAD ELECT DEFIB RADIOL ZOLL (MISCELLANEOUS) ×4 IMPLANT
PENCIL BUTTON HOLSTER BLD 10FT (ELECTRODE) ×4 IMPLANT
PERCLOSE PROGLIDE 6F (VASCULAR PRODUCTS) ×8
SET MICROPUNCTURE 5F STIFF (MISCELLANEOUS) ×4 IMPLANT
SHEATH BRITE TIP 6FR 35CM (SHEATH) ×4 IMPLANT
SHEATH PINNACLE 6F 10CM (SHEATH) ×4 IMPLANT
SHEATH PINNACLE 8F 10CM (SHEATH) ×4 IMPLANT
SLEEVE REPOSITIONING LENGTH 30 (MISCELLANEOUS) ×4 IMPLANT
SPONGE LAP 4X18 RFD (DISPOSABLE) IMPLANT
STOPCOCK MORSE 400PSI 3WAY (MISCELLANEOUS) ×8 IMPLANT
SUT ETHIBOND X763 2 0 SH 1 (SUTURE) IMPLANT
SUT GORETEX CV 4 TH 22 36 (SUTURE) IMPLANT
SUT GORETEX CV4 TH-18 (SUTURE) IMPLANT
SUT MNCRL AB 3-0 PS2 18 (SUTURE) IMPLANT
SUT PROLENE 5 0 C 1 36 (SUTURE) IMPLANT
SUT PROLENE 6 0 C 1 30 (SUTURE) IMPLANT
SUT SILK  1 MH (SUTURE) ×2
SUT SILK 1 MH (SUTURE) ×2 IMPLANT
SUT VIC AB 2-0 CT1 27 (SUTURE)
SUT VIC AB 2-0 CT1 TAPERPNT 27 (SUTURE) IMPLANT
SUT VIC AB 2-0 CTX 36 (SUTURE) IMPLANT
SUT VIC AB 3-0 SH 8-18 (SUTURE) IMPLANT
SYR 30ML LL (SYRINGE) ×3 IMPLANT
SYR 50ML LL SCALE MARK (SYRINGE) ×4 IMPLANT
SYR BULB IRRIGATION 50ML (SYRINGE) IMPLANT
SYR CONTROL 10ML LL (SYRINGE) IMPLANT
TAPE CLOTH SURG 4X10 WHT LF (GAUZE/BANDAGES/DRESSINGS) ×3 IMPLANT
TOWEL GREEN STERILE (TOWEL DISPOSABLE) ×8 IMPLANT
TRANSDUCER W/STOPCOCK (MISCELLANEOUS) ×8 IMPLANT
TRAY FOLEY SLVR 16FR TEMP STAT (SET/KITS/TRAYS/PACK) IMPLANT
VALVE HEART TRANSCATH SZ3 26MM (Prosthesis & Implant Heart) ×3 IMPLANT
WIRE .035 3MM-J 145CM (WIRE) ×4 IMPLANT

## 2018-04-21 NOTE — Anesthesia Preprocedure Evaluation (Signed)
Anesthesia Evaluation  Patient identified by MRN, date of birth, ID band Patient awake    Reviewed: Allergy & Precautions, NPO status , Patient's Chart, lab work & pertinent test results  History of Anesthesia Complications Negative for: history of anesthetic complications  Airway Mallampati: II  TM Distance: >3 FB Neck ROM: Full    Dental  (+) Missing, Poor Dentition, Edentulous Upper,    Pulmonary COPD, former smoker,  DLCO 30s on PFTs, emphysematous changes on CXR, patient SOB for several weeks and using accessory muscles today   Faint expiratory wheezing, use of accessory muscles, improved after duoneb and CTAB    + decreased breath sounds+ wheezing      Cardiovascular hypertension, Pt. on medications (-) angina(-) Past MI and (-) CHF + Valvular Problems/Murmurs AS  Rhythm:Regular     Neuro/Psych negative neurological ROS  negative psych ROS   GI/Hepatic negative GI ROS, Neg liver ROS,   Endo/Other  negative endocrine ROS  Renal/GU negative Renal ROS     Musculoskeletal negative musculoskeletal ROS (+)   Abdominal   Peds  Hematology negative hematology ROS (+)   Anesthesia Other Findings   Reproductive/Obstetrics                             Anesthesia Physical Anesthesia Plan  ASA: IV  Anesthesia Plan: MAC   Post-op Pain Management:    Induction: Intravenous  PONV Risk Score and Plan: 1 and Treatment may vary due to age or medical condition  Airway Management Planned: Nasal Cannula  Additional Equipment:   Intra-op Plan:   Post-operative Plan:   Informed Consent: I have reviewed the patients History and Physical, chart, labs and discussed the procedure including the risks, benefits and alternatives for the proposed anesthesia with the patient or authorized representative who has indicated his/her understanding and acceptance.   Dental advisory given  Plan Discussed  with: CRNA and Surgeon  Anesthesia Plan Comments: (Discussed PFTs and increased risk of complications in setting of GETA, discussed MAC and avoiding intubation if possible )        Anesthesia Quick Evaluation

## 2018-04-21 NOTE — Progress Notes (Signed)
Pt arrived from cath lab. Pt oriented to room and staff. Vitals obtained. Telemetry applied and CCMD notified. CHG bath completed. Bilateral groins level 0. Bedrest until 7 pm. Pt denies needs. Will continue to monitor.   Ara Kussmaul BSN, RN

## 2018-04-21 NOTE — Op Note (Signed)
HEART AND VASCULAR CENTER   MULTIDISCIPLINARY HEART VALVE TEAM   TAVR OPERATIVE NOTE   Date of Procedure:  04/21/2018  Preoperative Diagnosis: Severe Aortic Stenosis   Postoperative Diagnosis: Same   Procedure:    Transcatheter Aortic Valve Replacement - Percutaneous Right Transfemoral Approach  Edwards Sapien 3 THV (size 26 mm, model # 9600TFX, serial # [id number])   Co-Surgeons:  Valentina Gu. Roxy Manns, MD and Lauree Chandler, MD  Anesthesiologist:  Laurie Panda, MD  Echocardiographer:  Ena Dawley, MD  Pre-operative Echo Findings:  Severe aortic stenosis  Normal left ventricular systolic function  Post-operative Echo Findings:  Trivial paravalvular leak  Normal left ventricular systolic function   BRIEF CLINICAL NOTE AND INDICATIONS FOR SURGERY  Patient is an 83 year old male with history of aortic stenosis, hypertension, hyperlipidemia, and long-standing tobacco abuse who has been referred for surgical consultation to discuss treatment options for management of severe symptomatic aortic stenosis.  The patient states that he has had a heart murmur for many years.  He has been followed carefully initially by Dr. Willey Blade and more recently by Dr. Domenic Polite.  Echocardiograms have demonstrated the presence of aortic stenosis that has gradually progressed in severity.  Left ventricular function has remained normal.  Patient claims to be asymptomatic until fairly recently.  The patient now admits to gradual decline in his exercise tolerance with worsening fatigue and exertional shortness of breath.  Follow-up echocardiogram performed further progression of his aortic stenosis with peak velocity across the aortic valve measured 4.8 m/s corresponding to mean transvalvular gradient estimated 58 mmHg.  The DVI was quite low at 0.16 and aortic valve area calculated 0.51 cm.  The patient was referred to the multidisciplinary heart valve clinic and has been evaluated previously by Dr.  Angelena Form.  Diagnostic cardiac catheterization performed March 25, 2018 confirmed the presence of severe aortic stenosis with peak to peak and mean transvalvular gradients measured 43 and 38 mmHg respectively, corresponding to aortic valve area calculated 0.70 cm.  Patient was noted to have moderate nonobstructive three-vessel coronary artery disease.  Right heart pressures were normal.  The patient was referred for surgical consultation and has subsequently undergone CT angiography.   During the course of the patient's preoperative work up they have been evaluated comprehensively by a multidisciplinary team of specialists coordinated through the [facility] in the [facility].  They have been demonstrated to suffer from symptomatic severe aortic stenosis as noted above. The patient has been counseled extensively as to the relative risks and benefits of all options for the treatment of severe aortic stenosis including long term medical therapy, conventional surgery for aortic valve replacement, and transcatheter aortic valve replacement.  All questions have been answered, and the patient provides full informed consent for the operation as described.   DETAILS OF THE OPERATIVE PROCEDURE  PREPARATION:    The patient is brought to the operating room on the above mentioned date and central monitoring was established by the anesthesia team including placement of a central venous line and radial arterial line. The patient is placed in the supine position on the operating table.  Intravenous antibiotics are administered. The patient is monitored closely throughout the procedure under conscious sedation.  Baseline transthoracic echocardiogram was performed. The patient's chest, abdomen, both groins, and both lower extremities are prepared and draped in a sterile manner. A time out procedure is performed.   PERIPHERAL ACCESS:    Using the modified Seldinger  technique, femoral arterial and venous  access was obtained with placement of 6 Fr sheaths on the left side.  A pigtail diagnostic catheter was passed through the left arterial sheath under fluoroscopic guidance into the aortic root.  A temporary transvenous pacemaker catheter was passed through the left femoral venous sheath under fluoroscopic guidance into the right ventricle.  The pacemaker was tested to ensure stable lead placement and pacemaker capture. Aortic root angiography was performed in order to determine the optimal angiographic angle for valve deployment.   TRANSFEMORAL ACCESS:   Percutaneous transfemoral access and sheath placement was performed using ultrasound guidance.  The right common femoral artery was cannulated using a micropuncture needle and appropriate location was verified using hand injection angiogram.  A pair of Abbott Perclose percutaneous closure devices were placed and a 6 French sheath replaced into the femoral artery.  The patient was heparinized systemically and ACT verified > 250 seconds.    A 14 Fr transfemoral E-sheath was introduced into the right common femoral artery after progressively dilating over an Amplatz superstiff wire. An AL-3 catheter was used to direct a straight-tip exchange length wire across the native aortic valve into the left ventricle. This was exchanged out for a pigtail catheter and position was confirmed in the LV apex. Simultaneous LV and Ao pressures were recorded.  The pigtail catheter was exchanged for an Amplatz Extra-stiff wire in the LV apex.  Echocardiography was utilized to confirm appropriate wire position and no sign of entanglement in the mitral subvalvular apparatus.   TRANSCATHETER HEART VALVE DEPLOYMENT:   An Edwards Sapien 3 transcatheter heart valve (size 26 mm, model #9600TFX, serial #[id number]) was prepared and crimped per manufacturer's guidelines, and the proper orientation of the valve is confirmed on the [employer]  delivery system. The valve was advanced through the introducer sheath using normal technique until in an appropriate position in the abdominal aorta beyond the sheath tip. The balloon was then retracted and using the fine-tuning wheel was centered on the valve. The valve was then advanced across the aortic arch using appropriate flexion of the catheter. The valve was carefully positioned across the aortic valve annulus. The Commander catheter was retracted using normal technique. Once final position of the valve has been confirmed by angiographic assessment, the valve is deployed while temporarily holding ventilation and during rapid ventricular pacing to maintain systolic blood pressure < 50 mmHg and pulse pressure < 10 mmHg. The balloon inflation is held for >3 seconds after reaching full deployment volume. Once the balloon has fully deflated the balloon is retracted into the ascending aorta and valve function is assessed using echocardiography. There is felt to be trivial paravalvular leak and no central aortic insufficiency.  The patient's hemodynamic recovery following valve deployment is good.  The deployment balloon and guidewire are both removed.    PROCEDURE COMPLETION:   The sheath was removed and femoral artery closure performed.  Protamine was administered once femoral arterial repair was complete. The temporary pacemaker, pigtail catheters and femoral sheaths were removed with manual pressure used for hemostasis.   The patient tolerated the procedure well and is transported to the cath lab recovery area in stable condition. There were no immediate intraoperative complications. All sponge instrument and needle counts are verified correct at completion of the operation.   No blood products were administered during the operation.  The patient received a total of 39.8 mL of intravenous contrast during the procedure.   Rexene Alberts, MD 04/21/2018 2:34 PM

## 2018-04-21 NOTE — Progress Notes (Signed)
Nell Range in to see. RT paged to give nebulizer treatment. Order for  [facility] released.

## 2018-04-21 NOTE — Progress Notes (Signed)
Site area: left groin fa and fv sheaths pulled and pressure held by [facility] Prior to Removal:  Level 0 Pressure Applied For:  20 minutes Manual:   yes Patient Status During Pull:  yes Post Pull Site:  Level  0 Post Pull Instructions Given:  yes Post Pull Pulses Present:  Left dp palpable Dressing Applied:  Gauze and tegaderm Bedrest begins @ 1500 Comments:

## 2018-04-21 NOTE — Plan of Care (Signed)

## 2018-04-21 NOTE — Progress Notes (Signed)
Ambulated in hall 470 ft tolerated well, groins level zero. Will continue to monitor for changes.

## 2018-04-21 NOTE — Progress Notes (Addendum)
  [facility] VALVE TEAM  Patient doing well s/p TAVR. BPs are soft but he is hemodynamically stable off pressors. He was given 500 cc NS bolus and breathing treatment given some upper respiratory congestion. BPs run 90/60s at home chronically. Groin sites are stable. Labs are WNL. ECG with LAFB but no high grade block. Plan to DC arterial line and transfer to 4E stepdown.   Angelena Form PA-C  [facility]  Pager [phone]

## 2018-04-21 NOTE — Anesthesia Procedure Notes (Signed)
Procedure Name: MAC Date/Time: 04/21/2018 12:18 PM Performed by: Leonor Liv, CRNA Pre-anesthesia Checklist: Patient identified, Emergency Drugs available, Suction available, Patient being monitored and Timeout performed Oxygen Delivery Method: Simple face mask Placement Confirmation: positive ETCO2 Dental Injury: Teeth and Oropharynx as per pre-operative assessment

## 2018-04-21 NOTE — Interval H&P Note (Signed)
History and Physical Interval Note:  04/21/2018 11:46 AM  [name]  has presented today for surgery, with the diagnosis of Severe Aortic Stenosis  The various methods of treatment have been discussed with the patient and family. After consideration of risks, benefits and other options for treatment, the patient has consented to  Procedure(s): TRANSCATHETER AORTIC VALVE REPLACEMENT, TRANSFEMORAL (N/A) TRANSESOPHAGEAL ECHOCARDIOGRAM (TEE) (N/A) as a surgical intervention .  The patient's history has been reviewed, patient examined, no change in status, stable for surgery.  I have reviewed the patient's chart and labs.  Questions were answered to the patient's satisfaction.     Travis Murphy

## 2018-04-21 NOTE — CV Procedure (Signed)
HEART AND VASCULAR CENTER  TAVR OPERATIVE NOTE   Date of Procedure:  04/21/2018  Preoperative Diagnosis: Severe Aortic Stenosis   Postoperative Diagnosis: Same   Procedure:    Transcatheter Aortic Valve Replacement - Transfemoral Approach  Edwards Sapien 3 THV (size 26 mm, model # [id number], serial # [id number])   Co-Surgeons:  Lauree Chandler, MD and Valentina Gu. Roxy Manns, MD   Anesthesiologist:  Ermalene Postin  Echocardiographer:  Meda Coffee  Pre-operative Echo Findings:  severe aortic stenosis  Normal left ventricular systolic function  Post-operative Echo Findings:  Trivial paravalvular leak  Normal left ventricular systolic function  BRIEF CLINICAL NOTE AND INDICATIONS FOR SURGERY  83 yo male with history of aortic stenosis, HTN, hyperlipidemia and borderline diabetes mellitus who is here today for TAVR.  He has been followed for aortic stenosis for several years. Echocardiogram in 2017 showed normal LV systolic function with severe aortic stenosis. He was asymptomatic at that time. He has developed worsening dyspnea over the past few months. Echo was repeated on 01/26/18 and showed normal LV systolic function with VCBS=49-67%, grade 2 diastolic dysfunction. The aortic valve calcified with limited leaflet excursion. Mean gradient 58 mmHg, peak gradient 93 mmHg, AVA 0.55 cm2, DVI 0.15, consistent with severe aortic stenosis. He was recently seen in our [facility] cardiology office and noted reduced exercise tolerance, dyspnea with exertion and dizziness. He had played golf on a weekly basis up until last fall when he stopped due to dyspnea. No LE edema. Workup showed 26 mm valve would be appropriate.   During the course of the patient's preoperative work up they have been evaluated comprehensively by a multidisciplinary team of specialists coordinated through the [facility] in the [facility].  They have been demonstrated to suffer  from symptomatic severe aortic stenosis as noted above. The patient has been counseled extensively as to the relative risks and benefits of all options for the treatment of severe aortic stenosis including long term medical therapy, conventional surgery for aortic valve replacement, and transcatheter aortic valve replacement.  The patient has been independently evaluated by two cardiac surgeons including Dr Roxy Manns and they are felt to be at high risk for conventional surgical aortic valve replacement. The surgeon indicated the patient would be a poor candidate for conventional surgery. Based upon review of all of the patient's preoperative diagnostic tests they are felt to be candidate for transcatheter aortic valve replacement using the transfemoral approach as an alternative to high risk conventional surgery.    Following the decision to proceed with transcatheter aortic valve replacement, a discussion has been held regarding what types of management strategies would be attempted intraoperatively in the event of life-threatening complications, including whether or not the patient would be considered a candidate for the use of cardiopulmonary bypass and/or conversion to open sternotomy for attempted surgical intervention.  The patient has been advised of a variety of complications that might develop peculiar to this approach including but not limited to risks of death, stroke, paravalvular leak, aortic dissection or other major vascular complications, aortic annulus rupture, device embolization, cardiac rupture or perforation, acute myocardial infarction, arrhythmia, heart block or bradycardia requiring permanent pacemaker placement, congestive heart failure, respiratory failure, renal failure, pneumonia, infection, other late complications related to structural valve deterioration or migration, or other complications that might ultimately cause a temporary or permanent loss of functional independence or other long  term morbidity.  The patient provides full informed consent for the procedure as described and  all questions were answered preoperatively.    DETAILS OF THE OPERATIVE PROCEDURE  PREPARATION:   The patient is brought to the operating room on the above mentioned date and central monitoring was established by the anesthesia team including placement of a radial arterial line. The patient is placed in the supine position on the operating table.  Intravenous antibiotics are administered. Conscious sedation is used.   Baseline transthoracic echocardiogram was performed. The patient's chest, abdomen, both groins, and both lower extremities are prepared and draped in a sterile manner. A time out procedure is performed.  PERIPHERAL ACCESS:   Using the modified Seldinger technique, femoral arterial and venous access were obtained with placement of 6 Fr sheaths on the left side.  A pigtail diagnostic catheter was passed through the femoral arterial sheath under fluoroscopic guidance into the aortic root.  A temporary transvenous pacemaker catheter was passed through the femoral venous sheath under fluoroscopic guidance into the right ventricle.  The pacemaker was tested to ensure stable lead placement and pacemaker capture. Aortic root angiography was performed in order to determine the optimal angiographic angle for valve deployment.  TRANSFEMORAL ACCESS:  A micropuncture kit was used to gain access to the right femoral artery. Position confirmed with angiography. Pre-closure with double ProGlide closure devices. The patient was heparinized systemically and ACT verified > 250 seconds.    A 14 Fr transfemoral E-sheath was introduced into the right femoral artery after progressively dilating over an Amplatz superstiff wire. An AL-3 catheter was used to direct a straight-tip exchange length wire across the native aortic valve into the left ventricle. This was exchanged out for a pigtail catheter and position was  confirmed in the LV apex. Simultaneous LV and Ao pressures were recorded.  The pigtail catheter was then exchanged for an Amplatz Extra-stiff wire in the LV apex.   TRANSCATHETER HEART VALVE DEPLOYMENT:  An Edwards Sapien 3 THV (size 26 mm) was prepared and crimped per manufacturer's guidelines, and the proper orientation of the valve is confirmed on the [employer] delivery system. The valve was advanced through the introducer sheath using normal technique until in an appropriate position in the abdominal aorta beyond the sheath tip. The balloon was then retracted and using the fine-tuning wheel was centered on the valve. The valve was then advanced across the aortic arch using appropriate flexion of the catheter. The valve was carefully positioned across the aortic valve annulus. The Commander catheter was retracted using normal technique. Once final position of the valve has been confirmed by angiographic assessment, the valve is deployed while temporarily holding ventilation and during rapid ventricular pacing to maintain systolic blood pressure < 50 mmHg and pulse pressure < 10 mmHg. The balloon inflation is held for >3 seconds after reaching full deployment volume. Once the balloon has fully deflated the balloon is retracted into the ascending aorta and valve function is assessed using TTE. There is felt to be trivial paravalvular leak and no central aortic insufficiency.  The patient's hemodynamic recovery following valve deployment is good.  The deployment balloon and guidewire are both removed. Echo demostrated acceptable post-procedural gradients, stable mitral valve function, and trivial AI.   PROCEDURE COMPLETION:  The sheath was then removed and closure devices were completed. Protamine was administered once femoral arterial repair was complete. The temporary pacemaker, pigtail catheters and femoral sheaths were removed with manual pressure used for hemostasis.   The patient tolerated the  procedure well and is transported to the surgical intensive care in stable  condition. There were no immediate intraoperative complications. All sponge instrument and needle counts are verified correct at completion of the operation.   No blood products were administered during the operation.  The patient received a total of 40 mL of intravenous contrast during the procedure.  Lauree Chandler MD 04/21/2018 2:32 PM

## 2018-04-21 NOTE — Transfer of Care (Signed)
Immediate Anesthesia Transfer of Care Note  Patient: Travis Murphy  Procedure(s) Performed: TRANSCATHETER AORTIC VALVE REPLACEMENT, TRANSFEMORAL (N/A Chest) INTRAOPERATIVE TRANSTHORACIC ECHOCARDIOGRAM (N/A Chest)  Patient Location: Cath Lab  Anesthesia Type:MAC  Level of Consciousness: drowsy  Airway & Oxygen Therapy: Patient Spontanous Breathing and Patient connected to face mask oxygen  Post-op Assessment: Report given to RN, Post -op Vital signs reviewed and stable and Patient moving all extremities  Post vital signs: Reviewed and stable  Last Vitals:  Vitals Value Taken Time  BP 102/55 04/21/2018  2:50 PM  Temp 36 C 04/21/2018  2:47 PM  Pulse 73 04/21/2018  2:51 PM  Resp 20 04/21/2018  2:51 PM  SpO2 92 % 04/21/2018  2:51 PM  Vitals shown include unvalidated device data.  Last Pain:  Vitals:   04/21/18 1447  TempSrc: Temporal  PainSc: 0-No pain         Complications: No apparent anesthesia complications

## 2018-04-21 NOTE — Progress Notes (Signed)
Breathing treatment completed. PCXR done

## 2018-04-21 NOTE — Progress Notes (Signed)
  Echocardiogram 2D Echocardiogram has been performed.  Bobbye Charleston 04/21/2018, 2:38 PM

## 2018-04-21 NOTE — Anesthesia Procedure Notes (Signed)
Central Venous Catheter Insertion Performed by: Oleta Mouse, MD, anesthesiologist Start/End8/27/2019 11:19 AM, 04/21/2018 11:24 AM Patient location: Pre-op. Preanesthetic checklist: patient identified, IV checked, site marked, risks and benefits discussed, surgical consent, monitors and equipment checked, pre-op evaluation, timeout performed and anesthesia consent Lidocaine 1% used for infiltration and patient sedated Hand hygiene performed  and maximum sterile barriers used  Catheter size: 8 Fr Total catheter length 16. Central line was placed.Double lumen Procedure performed using ultrasound guided technique. Ultrasound Notes:image(s) printed for medical record Attempts: 1 Following insertion, dressing applied, line sutured and Biopatch. Post procedure assessment: blood return through all ports, free fluid flow and no air  Patient tolerated the procedure well with no immediate complications.

## 2018-04-21 NOTE — Anesthesia Procedure Notes (Signed)
Arterial Line Insertion Start/End8/27/2019 11:00 AM, 04/21/2018 11:10 AM Performed by: Leonor Liv, CRNA, CRNA  Patient location: Pre-op. Preanesthetic checklist: patient identified, IV checked, site marked, risks and benefits discussed, surgical consent, monitors and equipment checked, pre-op evaluation, timeout performed and anesthesia consent Lidocaine 1% used for infiltration and patient sedated Right, radial was placed Catheter size: 20 G Hand hygiene performed  and maximum sterile barriers used  Allen's test indicative of satisfactory collateral circulation Attempts: 2 Procedure performed without using ultrasound guided technique. Following insertion, Biopatch and dressing applied. Post procedure assessment: normal  Patient tolerated the procedure well with no immediate complications.

## 2018-04-21 NOTE — Progress Notes (Signed)
Site area: rt radial Site Prior to Removal:  Level 0 Pressure Applied For:5 min Manual:   yes Patient Status During Pull:  stable Post Pull Site:  Level 0 Post Pull Instructions Given:  yes Post Pull Pulses Present: palpable Dressing Applied:  clear Bedrest begins @ NA Comments:radial a line

## 2018-04-22 ENCOUNTER — Encounter (HOSPITAL_COMMUNITY): Payer: Self-pay | Admitting: Cardiovascular Disease

## 2018-04-22 ENCOUNTER — Other Ambulatory Visit: Payer: Self-pay | Admitting: Physician Assistant

## 2018-04-22 ENCOUNTER — Inpatient Hospital Stay (HOSPITAL_COMMUNITY): Payer: Medicare Other

## 2018-04-22 DIAGNOSIS — Z952 Presence of prosthetic heart valve: Secondary | ICD-10-CM

## 2018-04-22 DIAGNOSIS — R911 Solitary pulmonary nodule: Secondary | ICD-10-CM | POA: Diagnosis present

## 2018-04-22 DIAGNOSIS — I5030 Unspecified diastolic (congestive) heart failure: Secondary | ICD-10-CM

## 2018-04-22 LAB — CBC
HCT: 41 % (ref 39.0–52.0)
HEMOGLOBIN: 13.6 g/dL (ref 13.0–17.0)
MCH: 32.1 pg (ref 26.0–34.0)
MCHC: 33.2 g/dL (ref 30.0–36.0)
MCV: 96.7 fL (ref 78.0–100.0)
Platelets: 115 10*3/uL — ABNORMAL LOW (ref 150–400)
RBC: 4.24 MIL/uL (ref 4.22–5.81)
RDW: 13.2 % (ref 11.5–15.5)
WBC: 6.8 10*3/uL (ref 4.0–10.5)

## 2018-04-22 LAB — BASIC METABOLIC PANEL
Anion gap: 7 (ref 5–15)
BUN: 10 mg/dL (ref 8–23)
CALCIUM: 8.2 mg/dL — AB (ref 8.9–10.3)
CHLORIDE: 107 mmol/L (ref 98–111)
CO2: 26 mmol/L (ref 22–32)
CREATININE: 0.75 mg/dL (ref 0.61–1.24)
GFR calc Af Amer: >60 mL/min (ref >60)
GFR calc non Af Amer: >60 mL/min (ref >60)
Glucose, Bld: 111 mg/dL — ABNORMAL HIGH (ref 70–99)
Potassium: 3.6 mmol/L (ref 3.5–5.1)
SODIUM: 140 mmol/L (ref 135–145)

## 2018-04-22 LAB — ECHOCARDIOGRAM COMPLETE
Height: 65 in
Weight: 2447.9878 oz

## 2018-04-22 LAB — MAGNESIUM: MAGNESIUM: 2.1 mg/dL (ref 1.7–2.4)

## 2018-04-22 MED ORDER — CLOPIDOGREL BISULFATE 75 MG PO TABS: 75.0000 mg | ORAL_TABLET | Freq: Every day | ORAL | 1 refills | Status: DC

## 2018-04-22 NOTE — Progress Notes (Signed)
  Echocardiogram 2D Echocardiogram has been performed.  Travis Murphy 04/22/2018, 10:51 AM

## 2018-04-22 NOTE — Progress Notes (Signed)
CARDIAC REHAB PHASE I   PRE:  Rate/Rhythm: 71 SR    BP: sitting 116/55    SaO2: 90 RA  MODE:  Ambulation: 470 ft   POST:  Rate/Rhythm: 90 SR with PVCs    BP: sitting 149/85     SaO2: 90 RA  Pt steady when walking. Stopped x2 and pt sways/totters when still. He sts he has had vertigo in the past but denies dizziness right now. Discussed restrictions, walking daily, smoking cessation, and CRPII. Voiced understanding. He is not very interested in quitting smoking. Gave him resources for quitting and fake cigarette. I also gave him IS, he was only able to do 600 mL. Made him aware of his borderline SaO2, congestion, and need for more volume. Will refer to [facility], which he is interested in. Daughters very supportive. [place], ACSM 04/22/2018 11:48 AM

## 2018-04-22 NOTE — Discharge Instructions (Signed)
ACTIVITY AND EXERCISE °• Daily activity and exercise are an important part of your recovery. People recover at different rates depending on their general health and type of valve procedure. °• Most people recovering from TAVR feel better relatively quickly  °• No lifting, pushing, pulling more than 10 pounds (examples to avoid: groceries, vacuuming, gardening, golfing): °            - For one week with a procedure through the groin. °            - For six weeks for procedures through the chest wall. °            - For three months for procedures through the breast-bone. °NOTE: You will typically see one of our providers 7-10 days after your procedure to discuss WHEN TO RESUME the above activities.  °  °  °DRIVING °• Do not drive for until you are seen for follow up and cleared by a provider. °• If you have been told by your doctor in the past that you may not drive, you must talk with him/her before you begin driving again. °  °  °DRESSING °• Groin site: you may leave the clear dressing over the site for up to one week or until it falls off. °  °  °HYGIENE °• If you had a femoral (leg) procedure, you may take a shower when you return home. After the shower, pat the site dry. Do NOT use powder, oils or lotions in your groin area until the site has completely healed. °• If you had a chest procedure, you may shower when you return home unless specifically instructed not to by your discharging practitioner. °            - DO NOT scrub incision; pat dry with a towel °            - DO NOT apply any lotions, oils, powders to the incision °            - No tub baths / swimming for at least 2 weeks. °• If you notice any fevers, chills, increased pain, swelling, bleeding or pus, please contact your doctor. °  °ADDITIONAL INFORMATION °• If you are going to have an upcoming dental procedure, please contact our office as you will require antibiotics ahead of time to prevent infection on your heart valve.  ° ° ° ° ° °After TAVR  Checklist ° °Check  Test Description  ° Follow up appointment in 1-2 weeks  You will see our structural heart physician assistant, Katie Raevyn Sokol. Your incision sites will be checked and you will be cleared to drive and resume all normal activities if you are doing well.    ° 1 month echo and follow up  You will have an echo to check on your new heart valve and be seen back in the office by Katie Aayden Cefalu. Many times the echo is not read by your appointment time, but Katie will call you later that day or the following day to report your results.  ° Follow up with your primary cardiologist You will need to be seen by your primary cardiologist in the following 3-6 months after your 1 month appointment in the valve clinic. Often times your Plavix or Aspirin will be discontinued during this time, but this is decided on a case by case basis.   ° 1 year echo and follow up You will have another echo to check on your heart valve   after 1 year and be seen back in the office by Katie Jasen Hartstein. This your last structural heart visit.  ° Bacterial endocarditis prophylaxis  You will have to take antibiotics for the rest of your life before all dental procedures (even teeth cleanings) to protect your heart valve. Antibiotics are also required before some surgeries. Please check with your cardiologist before scheduling any surgeries. Also, please make sure to tell us if you have a penicillin allergy as you will require an alternative antibiotic.   ° ° °

## 2018-04-22 NOTE — Progress Notes (Signed)
Patient and family given discharge instructions medication list and follow up appointments. IVs and tele dcd. All questions were answered will discharge home as ordered. Will transport to exit with nursing staff and wheelchair. Tayden Duran, Bettina Gavia RN

## 2018-04-22 NOTE — Consult Note (Signed)
            [facility] CM Primary Care Navigator  04/22/2018  Travis Murphy Nov 21, 1934 [id number]   Went to seepatient at the bedside to identify possible discharge needs buthe wasalready discharged homeper staff.  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 Patient has had an uncomplicated hospital course.   Patient has discharge instruction to follow-up withcardiology on 04/22/18 and will see him back in the clinic in 1 week for TOC (transition of care) visit.  Primary care provider's office is listed as providing transition of care (TOC) follow-up.   For additional questions please contact:  Edwena Felty A. Caylah Plouff, BSN, RN-BC [facility] PRIMARY CARE Navigator Cell: [phone]

## 2018-04-22 NOTE — Progress Notes (Signed)
Central line Dcd as ordered and per protocol. Pressure held and dressing applied. Tip intact. Patient tolerated well reminded to like supine for 30 minutes. Will monitor patient. Preston Garabedian, Bettina Gavia RN

## 2018-04-22 NOTE — Discharge Summary (Addendum)
[name]VE TEAM   Discharge Summary    Patient ID: Travis Murphy,  MRN: [id number], DOB/AGE: 01-13-1935 83 y.o.  Admit date: 04/21/2018 Discharge date: 04/22/2018  Primary Care Provider: Asencion Noble Primary Cardiologist: Dr. Domenic Polite / Dr. Angelena Form & Dr. Roxy Manns (TAVR)   Discharge Diagnoses    Principal Problem:   S/P TAVR (transcatheter aortic valve replacement) Active Problems:   Essential hypertension   Severe aortic valve stenosis   Incidental pulmonary nodule, greater than or equal to 97mm   Tobacco abuse   Hyperlipemia   Pulmonary nodule   Allergies No Known Allergies   History of Present Illness     Travis Murphy is a 83 y.o. male with a history of HTN, HLD, ongoing tobacco abuse and severe AS who presented to [facility] on 04/21/18 for planned TAVR.  The patient states that he has had a heart murmur for many years.  He has been followed carefully initially by Dr. Willey Blade and more recently by Dr. Domenic Polite.  Echocardiograms have demonstrated the presence of aortic stenosis that has gradually progressed in severity.  Left ventricular function has remained normal.  Patient claims to be asymptomatic until fairly recently.  The patient now admits to gradual decline in his exercise tolerance with worsening fatigue and exertional shortness of breath.  Follow-up echocardiogram performed further progression of his aortic stenosis with peak velocity across the aortic valve measured 4.8 m/s corresponding to mean transvalvular gradient estimated 58 mmHg.  The DVI was quite low at 0.16 and aortic valve area calculated 0.51 cm.  The patient was referred to the multidisciplinary heart valve clinic and has been evaluated previously by Dr. Angelena Form.  Diagnostic cardiac catheterization performed March 25, 2018 confirmed the presence of severe aortic stenosis with peak to peak and mean transvalvular gradients measured 43 and 38 mmHg respectively,  corresponding to aortic valve area calculated 0.70 cm.  Patient was noted to have moderate nonobstructive three-vessel coronary artery disease.    He was felt to be a suitable TAVR candidate, which was set up for 04/21/2018.   Hospital Course     Consultants: none  Severe AS:s/p successful TAVR with a 26 mm Edwards Sapien 3 THV via the TF approach on 04/21/18. Post operative echo showed EF 60-65%, normally functioning TAVR with mild-mod PVL, mean gradient 15 mm Hg. Groin sites are stable. ECG with NSR and no evidence of high grade heart block. He will be discharged on ASA and plavix. I will see him back in clinic in 1 week for TOC visit.  HTN: BP has been under good control. I will resume home ACEi  HLD: continue statin  Lung nodule: RLL nodule that will require 3 month follow up CT. I will arrange this as an outpatient.   AAA: 3.1 cm infrarenal AAA that requires 3 year follow up. I will defer to primary cardiologist.   The patient has had an uncomplicated hospital course and is recovering well. The femoral catheter sites are stable. He has been seen by Dr. Roxy Manns today and deemed ready for discharge home. All follow-up appointments have been scheduled. Discharge medications are listed below.  _____________  Discharge Vitals Blood pressure (!) 116/55, pulse 80, temperature 98.3 F (36.8 C), temperature source Oral, resp. rate (!) 24, height 5\' 5"  (1.651 m), weight 69.4 kg, SpO2 98 %.  Filed Weights   04/21/18 1020 04/21/18 1741 04/22/18 0408  Weight: 67.6 kg 68.2 kg 69.4 kg    Labs &  Radiologic Studies     CBC Recent Labs    04/21/18 1454 04/22/18 0350  WBC  --  6.8  HGB 13.6 13.6  HCT 40.0 41.0  MCV  --  96.7  PLT  --  176*   Basic Metabolic Panel Recent Labs    04/21/18 1454 04/22/18 0350  NA 145 140  K 3.8 3.6  CL 105 107  CO2  --  26  GLUCOSE 129* 111*  BUN 10 10  CREATININE 0.70 0.75  CALCIUM  --  8.2*  MG  --  2.1   Liver Function Tests No results for  input(s): AST, ALT, ALKPHOS, BILITOT, PROT, ALBUMIN in the last 72 hours. No results for input(s): LIPASE, AMYLASE in the last 72 hours. Cardiac Enzymes No results for input(s): CKTOTAL, CKMB, CKMBINDEX, TROPONINI in the last 72 hours. BNP Invalid input(s): POCBNP D-Dimer No results for input(s): DDIMER in the last 72 hours. Hemoglobin A1C No results for input(s): HGBA1C in the last 72 hours. Fasting Lipid Panel No results for input(s): CHOL, HDL, LDLCALC, TRIG, CHOLHDL, LDLDIRECT in the last 72 hours. Thyroid Function Tests No results for input(s): TSH, T4TOTAL, T3FREE, THYROIDAB in the last 72 hours.  Invalid input(s): FREET3  Dg Chest 2 View  Result Date: 04/17/2018 CLINICAL DATA:  Preoperative examination. Patient scheduled for transcatheter aortic valve replacement. EXAM: CHEST - 2 VIEW COMPARISON:  CT chest, abdomen and pelvis 04/06/2018. FINDINGS: The lungs are emphysematous but clear. Heart size is normal. Aortic atherosclerosis is noted. No pneumothorax or pleural effusion. No acute or focal bony abnormality. IMPRESSION: Emphysema without acute disease. Atherosclerosis. Electronically Signed   By: Inge Rise M.D.   On: 04/17/2018 08:37   Ct Coronary Morph W/cta Cor W/score W/ca W/cm &/or Wo/cm  Addendum Date: 04/07/2018   ADDENDUM REPORT: 04/07/2018 13:14 EXAM: OVER-READ INTERPRETATION  CT CHEST The following report is an over-read performed by radiologist Dr. Samara Snide [facility] on 04/07/2018. This over-read does not include interpretation of cardiac or coronary anatomy or pathology. The CTA interpretation by the cardiologist is attached. COMPARISON:  08/19/2012 chest radiograph FINDINGS: Please see the separate concurrent chest CT angiogram report for details. Moderate centrilobular and paraseptal emphysema with diffuse bronchial wall thickening. Nodular 1.3 x 1.1 cm opacity in the central right lower lobe (series 16/image 50). No thoracic adenopathy.  IMPRESSION: Please see the separate concurrent chest CT angiogram report for details and follow-up recommendations. Electronically Signed   By: Ilona Sorrel M.D.   On: 04/07/2018 13:14   Result Date: 04/07/2018 CLINICAL DATA:  83 year old male with severe aortic stenosis being evaluated for a TAVR procedure. EXAM: Cardiac TAVR CT TECHNIQUE: The patient was scanned on a [employer]. A 120 kV retrospective scan was triggered in the descending thoracic aorta at 111 HU's. Gantry rotation speed was 250 msecs and collimation was .6 mm. No beta blockade or nitro were given. The 3D data set was reconstructed in 5% intervals of the R-R cycle. Systolic and diastolic phases were analyzed on a dedicated work station using MPR, MIP and VRT modes. The patient received 80 cc of contrast. FINDINGS: Aortic Valve: Trileaflet aortic valve with severely thickened and calcified leaflets and severely restricted leaflets opening. There are mild calcifications extending into the LVOT under the left coronary cusp. Aorta: There is mild ascending aortic aneurysm with maximum diameter 41 mm. Mild diffuse calcification in the thoracic aorta, no dissection. Sinotubular Junction: 30 x 30 mm Ascending Thoracic Aorta: 41 x 41 mm Aortic  Arch: 36 x 34 mm Descending Thoracic Aorta: 29 x 29 mm Sinus of Valsalva Measurements: Non-coronary: 37 mm Right -coronary: 34 mm Left -coronary: 38 mm Coronary Artery Height above Annulus: Left Main: 15 mm Right Coronary: 19 mm Virtual Basal Annulus Measurements: Maximum/Minimum Diameter: 28.1 x 24.0 mm Mean Diameter: 24.8 mm Perimeter: 80.2 mm Area: 484 mm2 Optimum Fluoroscopic Angle for Delivery: LAO 8 CAU 5. IMPRESSION: 1. Trileaflet aortic valve with severely thickened and calcified leaflets and severely restricted leaflets opening. There are mild calcifications extending into the LVOT under the left coronary cusp. Annular measurements suitable for delivery of a 26 mm Edwards-SAPIEN 3 valve. 2.  Sufficient coronary to annulus distance. 3. Optimum Fluoroscopic Angle for Delivery:  LAO 8 CAU 5. 4. There is mild ascending aortic aneurysm with maximum diameter 41 mm. 5. No thrombus in the left atrial appendage. Electronically Signed: By: Ena Dawley On: 04/06/2018 16:28   Dg Chest Port 1 View  Result Date: 04/21/2018 CLINICAL DATA:  Chest congestion EXAM: PORTABLE CHEST 1 VIEW COMPARISON:  04/17/2018 FINDINGS: Cardiac shadow is stable. Right jugular central line is noted. Right basilar atelectatic changes are seen. Left lung is clear. No bony abnormality is noted. IMPRESSION: Right basilar atelectatic changes. No pneumothorax following right jugular central line placement Electronically Signed   By: Inez Catalina M.D.   On: 04/21/2018 16:16   Ct Angio Chest Aorta W &/or Wo Contrast  Result Date: 04/07/2018 CLINICAL DATA:  Severe aortic stenosis. Dyspnea on exertion. Dizziness. Pre-TAVR evaluation. EXAM: CT ANGIOGRAPHY CHEST, ABDOMEN AND PELVIS TECHNIQUE: Multidetector CT imaging through the chest, abdomen and pelvis was performed using the standard protocol during bolus administration of intravenous contrast. Multiplanar reconstructed images and MIPs were obtained and reviewed to evaluate the vascular anatomy. CONTRAST:  15mL ISOVUE-370 IOPAMIDOL (ISOVUE-370) INJECTION 76% COMPARISON:  08/19/2012 chest radiograph. FINDINGS: CTA CHEST FINDINGS Cardiovascular: Top-normal heart size. No significant pericardial effusion/thickening. Left main and 3 vessel coronary atherosclerosis. Severe thickening and calcification of the aortic valve. Atherosclerotic thoracic aorta with ectatic 4.1 cm ascending thoracic aorta. Top-normal main pulmonary artery (3.2 cm diameter). No central pulmonary emboli. Mediastinum/Nodes: No discrete thyroid nodules. Unremarkable esophagus. No pathologically enlarged axillary, mediastinal or hilar lymph nodes. Lungs/Pleura: No pneumothorax. No pleural effusion. Moderate  centrilobular and paraseptal emphysema with diffuse bronchial wall thickening. There is a 1.3 x 1.1 cm nodular opacity in the central right lower lobe (series 16/image 50) with associated segmental airway narrowing. There is moderate focal varicoid bronchiectasis in the medial right lower lobe. Otherwise, no acute consolidative airspace disease, lung masses or additional significant pulmonary nodules. Musculoskeletal: No aggressive appearing focal osseous lesions. Marked thoracic spondylosis. CTA ABDOMEN AND PELVIS FINDINGS Hepatobiliary: Normal liver with no liver mass. Normal gallbladder with no radiopaque cholelithiasis. No biliary ductal dilatation. Pancreas: Normal, with no mass or duct dilation. Spleen: Normal size. No mass. Adrenals/Urinary Tract: Normal adrenals. Simple exophytic 3.7 cm renal cyst in the lateral lower right kidney. Simple exophytic 1.4 cm lateral lower left renal cyst. No hydronephrosis. Normal bladder. Stomach/Bowel: Normal non-distended stomach. Normal caliber small bowel with no small bowel wall thickening. Normal appendix. Moderate diffuse colonic diverticulosis, with no definite large bowel wall thickening or significant pericolonic fat stranding. Vascular/Lymphatic: Atherosclerotic abdominal aorta with 3.0 cm infrarenal abdominal aortic aneurysm. Patent renal veins. No pathologically enlarged lymph nodes in the abdomen or pelvis. Reproductive: Mildly enlarged prostate with coarse nonspecific internal prostatic calcifications. Other: No pneumoperitoneum, ascites or focal fluid collection. Musculoskeletal: No aggressive appearing focal osseous lesions.  Moderate lumbar spondylosis. VASCULAR MEASUREMENTS PERTINENT TO TAVR: AORTA: Minimal Aortic Diameter-18.5 x 17.8 mm (infrarenal abdominal aorta on series 15/image 114) Severity of Aortic Calcification-moderate RIGHT PELVIS: Right Common Iliac Artery - Minimal Diameter-11.2 x 10.9 mm Tortuosity-mild Calcification-moderate Right External  Iliac Artery - Minimal Diameter-8.4 x 6.0 mm Tortuosity-moderate to severe Calcification-moderate Right Common Femoral Artery - Minimal Diameter-7.8 x 6.5 mm Tortuosity-mild Calcification-moderate LEFT PELVIS: Left Common Iliac Artery - Minimal Diameter-9.9 x 9.7 mm Tortuosity-mild Calcification-moderate Left External Iliac Artery - Minimal Diameter-7.9 x 6.7 mm Tortuosity-mild Calcification-moderate, noting large soft plaque with associated mild focal fusiform ectasia in the mid left external iliac artery Left Common Femoral Artery - Minimal Diameter-8.3 x 7.2 mm Tortuosity-mild Calcification-moderate Review of the MIP images confirms the above findings. IMPRESSION: 1. Vascular findings and measurements pertinent to potential TAVR procedure, as detailed above. 2. Severe thickening and calcification of the aortic valve, compatible with the reported clinical history of severe aortic stenosis. 3. Indeterminate 1.3 cm nodular opacity in the central right lower lung lobe, with associated segmental airway narrowing. Findings may represent impacted mucous, with underlying neoplasm not excluded. Consider one of the following in 3 months in this high-risk individual: (a) repeat chest CT, (b) follow-up PET-CT, or (c) tissue sampling. This recommendation follows the consensus statement: Guidelines for Management of Incidental Pulmonary Nodules Detected on CT Images: From the Fleischner Society 2017; Radiology 2017; 284:228-243. 4. Left main and 3 vessel coronary atherosclerosis. 5. Infrarenal 3.1 cm abdominal aortic aneurysm. Abdominal Aortic Aneurysm (ICD10-I71.9). Recommend follow-up aortic ultrasound in 3 years. This recommendation follows ACR consensus guidelines: White Paper of the ACR Incidental Findings Committee II on Vascular Findings. J Am Coll Radiol 2013; 10:789-794. 6. Aortic Atherosclerosis (ICD10-I70.0) and Emphysema (ICD10-J43.9). 7. Moderate colonic diverticulosis. Electronically Signed   By: Ilona Sorrel  M.D.   On: 04/07/2018 13:13   Ct Angio Abd/pel W/ And/or W/o  Result Date: 04/07/2018 CLINICAL DATA:  Severe aortic stenosis. Dyspnea on exertion. Dizziness. Pre-TAVR evaluation. EXAM: CT ANGIOGRAPHY CHEST, ABDOMEN AND PELVIS TECHNIQUE: Multidetector CT imaging through the chest, abdomen and pelvis was performed using the standard protocol during bolus administration of intravenous contrast. Multiplanar reconstructed images and MIPs were obtained and reviewed to evaluate the vascular anatomy. CONTRAST:  122mL ISOVUE-370 IOPAMIDOL (ISOVUE-370) INJECTION 76% COMPARISON:  08/19/2012 chest radiograph. FINDINGS: CTA CHEST FINDINGS Cardiovascular: Top-normal heart size. No significant pericardial effusion/thickening. Left main and 3 vessel coronary atherosclerosis. Severe thickening and calcification of the aortic valve. Atherosclerotic thoracic aorta with ectatic 4.1 cm ascending thoracic aorta. Top-normal main pulmonary artery (3.2 cm diameter). No central pulmonary emboli. Mediastinum/Nodes: No discrete thyroid nodules. Unremarkable esophagus. No pathologically enlarged axillary, mediastinal or hilar lymph nodes. Lungs/Pleura: No pneumothorax. No pleural effusion. Moderate centrilobular and paraseptal emphysema with diffuse bronchial wall thickening. There is a 1.3 x 1.1 cm nodular opacity in the central right lower lobe (series 16/image 50) with associated segmental airway narrowing. There is moderate focal varicoid bronchiectasis in the medial right lower lobe. Otherwise, no acute consolidative airspace disease, lung masses or additional significant pulmonary nodules. Musculoskeletal: No aggressive appearing focal osseous lesions. Marked thoracic spondylosis. CTA ABDOMEN AND PELVIS FINDINGS Hepatobiliary: Normal liver with no liver mass. Normal gallbladder with no radiopaque cholelithiasis. No biliary ductal dilatation. Pancreas: Normal, with no mass or duct dilation. Spleen: Normal size. No mass.  Adrenals/Urinary Tract: Normal adrenals. Simple exophytic 3.7 cm renal cyst in the lateral lower right kidney. Simple exophytic 1.4 cm lateral lower left renal cyst. No hydronephrosis. Normal bladder. Stomach/Bowel:  Normal non-distended stomach. Normal caliber small bowel with no small bowel wall thickening. Normal appendix. Moderate diffuse colonic diverticulosis, with no definite large bowel wall thickening or significant pericolonic fat stranding. Vascular/Lymphatic: Atherosclerotic abdominal aorta with 3.0 cm infrarenal abdominal aortic aneurysm. Patent renal veins. No pathologically enlarged lymph nodes in the abdomen or pelvis. Reproductive: Mildly enlarged prostate with coarse nonspecific internal prostatic calcifications. Other: No pneumoperitoneum, ascites or focal fluid collection. Musculoskeletal: No aggressive appearing focal osseous lesions. Moderate lumbar spondylosis. VASCULAR MEASUREMENTS PERTINENT TO TAVR: AORTA: Minimal Aortic Diameter-18.5 x 17.8 mm (infrarenal abdominal aorta on series 15/image 114) Severity of Aortic Calcification-moderate RIGHT PELVIS: Right Common Iliac Artery - Minimal Diameter-11.2 x 10.9 mm Tortuosity-mild Calcification-moderate Right External Iliac Artery - Minimal Diameter-8.4 x 6.0 mm Tortuosity-moderate to severe Calcification-moderate Right Common Femoral Artery - Minimal Diameter-7.8 x 6.5 mm Tortuosity-mild Calcification-moderate LEFT PELVIS: Left Common Iliac Artery - Minimal Diameter-9.9 x 9.7 mm Tortuosity-mild Calcification-moderate Left External Iliac Artery - Minimal Diameter-7.9 x 6.7 mm Tortuosity-mild Calcification-moderate, noting large soft plaque with associated mild focal fusiform ectasia in the mid left external iliac artery Left Common Femoral Artery - Minimal Diameter-8.3 x 7.2 mm Tortuosity-mild Calcification-moderate Review of the MIP images confirms the above findings. IMPRESSION: 1. Vascular findings and measurements pertinent to potential TAVR  procedure, as detailed above. 2. Severe thickening and calcification of the aortic valve, compatible with the reported clinical history of severe aortic stenosis. 3. Indeterminate 1.3 cm nodular opacity in the central right lower lung lobe, with associated segmental airway narrowing. Findings may represent impacted mucous, with underlying neoplasm not excluded. Consider one of the following in 3 months in this high-risk individual: (a) repeat chest CT, (b) follow-up PET-CT, or (c) tissue sampling. This recommendation follows the consensus statement: Guidelines for Management of Incidental Pulmonary Nodules Detected on CT Images: From the Fleischner Society 2017; Radiology 2017; 284:228-243. 4. Left main and 3 vessel coronary atherosclerosis. 5. Infrarenal 3.1 cm abdominal aortic aneurysm. Abdominal Aortic Aneurysm (ICD10-I71.9). Recommend follow-up aortic ultrasound in 3 years. This recommendation follows ACR consensus guidelines: White Paper of the ACR Incidental Findings Committee II on Vascular Findings. J Am Coll Radiol 2013; 10:789-794. 6. Aortic Atherosclerosis (ICD10-I70.0) and Emphysema (ICD10-J43.9). 7. Moderate colonic diverticulosis. Electronically Signed   By: Ilona Sorrel M.D.   On: 04/07/2018 13:13     Diagnostic Studies/Procedures    TAVR OPERATIVE NOTE   Date of Procedure:                04/21/2018  Preoperative Diagnosis:      Severe Aortic Stenosis  Procedure:        Transcatheter Aortic Valve Replacement - Percutaneous Right Transfemoral Approach             Edwards Sapien 3 THV (size 26 mm, model # 9600TFX, serial # [id number])              Co-Surgeons:                        Valentina Gu. Roxy Manns, MD and Lauree Chandler, MD  Pre-operative Echo Findings: ? Severe aortic stenosis ? Normal left ventricular systolic function  Post-operative Echo Findings: ? Trivial paravalvular leak ? Normal left ventricular systolic function  _____________  2D ECHO 04/22/18 Study  Conclusions - Left ventricle: The cavity size was normal. There was severe   focal basal and mild concentric hypertrophy. Systolic function   was normal. The estimated ejection fraction was in the range of  60% to 65%. Wall motion was normal; there were no regional wall   motion abnormalities. There was an increased relative   contribution of atrial contraction to ventricular filling.   Doppler parameters are consistent with abnormal left ventricular   relaxation (grade 1 diastolic dysfunction). Doppler parameters   are consistent with high ventricular filling pressure. - Aortic valve: S/P TAVR with 4mm Edwards Sapien 3 valve which   appears to be well functioning. There is mild to moderate   perivalvular AI (mild visually and moderate by PHT 360ms). Mean   gradient (S): 15 mm Hg. Valve area (VTI): 2.29 cm^2. Valve area   (Vmax): 2.09 cm^2. Valve area (Vmean): 2.42 cm^2. Regurgitation   pressure half-time: 337 ms. - Mitral valve: There was trivial regurgitation. Valve area by   pressure half-time: 2.32 cm^2. - Atrial septum: There was increased thickness of the septum,   consistent with lipomatous hypertrophy. - Tricuspid valve: There was trivial regurgitation. - Pulmonic valve: There was trivial regurgitation. - Pulmonary arteries: Systolic pressure could not be accurately   estimated. Impressions: - Severe BSH and mild cLVH, normal LVF, trivial PR/MR/TR. Grade 1   DD. Stable TAVR with normal function and mild to moderate AI   (mild visually and moderate by PHT of 3101ms). Mean AVG is 30mmHg.   Disposition   Pt is being discharged home today in good condition.  Follow-up Plans & Appointments    Follow-up Information    Eileen Stanford, PA-C. Go on 04/22/2018.   Specialties:  Cardiology, Radiology Contact information: [place] [address] [phone]          Discharge Instructions    Amb Referral to Cardiac Rehabilitation    Complete by:  As directed    Diagnosis:  Valve Replacement   Valve:  Aortic Comment - TAVR      Discharge Medications     Medication List    TAKE these medications   aspirin EC 81 MG tablet Take 81 mg by mouth daily.   atorvastatin 10 MG tablet Commonly known as:  LIPITOR Take 10 mg by mouth daily.   clopidogrel 75 MG tablet Commonly known as:  PLAVIX Take 1 tablet (75 mg total) by mouth daily with breakfast. Start taking on:  04/23/2018   meclizine 25 MG tablet Commonly known as:  ANTIVERT Take 1 tablet (25 mg total) by mouth 4 (four) times daily. What changed:    when to take this  reasons to take this   ramipril 10 MG capsule Commonly known as:  ALTACE Take 10 mg by mouth daily.         Outstanding Labs/Studies   none  Duration of Discharge Encounter   Greater than 30 minutes including physician time.  Signed, Angelena Form PA-C 04/22/2018, 1:15 PM

## 2018-04-22 NOTE — Anesthesia Postprocedure Evaluation (Signed)
Anesthesia Post Note  Patient: OBINNA EHRESMAN  Procedure(s) Performed: TRANSCATHETER AORTIC VALVE REPLACEMENT, TRANSFEMORAL (N/A Chest) INTRAOPERATIVE TRANSTHORACIC ECHOCARDIOGRAM (N/A Chest)     Patient location during evaluation: PACU Anesthesia Type: MAC Level of consciousness: awake and alert Pain management: pain level controlled Vital Signs Assessment: post-procedure vital signs reviewed and stable Respiratory status: spontaneous breathing, nonlabored ventilation, respiratory function stable and patient connected to nasal cannula oxygen Cardiovascular status: stable and blood pressure returned to baseline Postop Assessment: no apparent nausea or vomiting Anesthetic complications: no    Last Vitals:  Vitals:   04/22/18 0400 04/22/18 0700  BP: 116/67 105/86  Pulse: 69 63  Resp: (!) 21 (!) 21  Temp: 36.8 C   SpO2: 93% 91%    Last Pain:  Vitals:   04/22/18 0821  TempSrc:   PainSc: 0-No pain                 Hollis Tuller

## 2018-04-23 ENCOUNTER — Telehealth: Payer: Self-pay

## 2018-04-23 MED FILL — Magnesium Sulfate Inj 50%: INTRAMUSCULAR | Qty: 10 | Status: AC

## 2018-04-23 MED FILL — Sodium Chloride IV Soln 0.9%: INTRAVENOUS | Qty: 250 | Status: AC

## 2018-04-23 MED FILL — Phenylephrine HCl IV Soln 10 MG/ML: INTRAVENOUS | Qty: 2 | Status: AC

## 2018-04-23 MED FILL — Heparin Sodium (Porcine) Inj 1000 Unit/ML: INTRAMUSCULAR | Qty: 30 | Status: AC

## 2018-04-23 MED FILL — Potassium Chloride Inj 2 mEq/ML: INTRAVENOUS | Qty: 40 | Status: AC

## 2018-04-23 NOTE — Telephone Encounter (Signed)
Patient contacted regarding discharge from [facility] on 04/22/2018.  Patient understands to follow up with provider Nell Range PA-C on 04/29/2018 at 3:30 PM at [facility]. Patient understands discharge instructions? yes Patient understands medications and regiment? yes Patient understands to bring all medications to this visit? yes  The pt is doing well s/p TAVR and denied any questions.  I also spoke with his daughter Clarene Critchley and she said the pt continues to have a congested cough. In review of hospital records the pt was given respiratory treatments before and after surgery. The pt does not have a history of using inhalers. I advised Clarene Critchley to continue to monitor the pt for signs of infection.  The pt does not have fever, chill or productive cough at this time. Clarene Critchley feels like the cough could be related to smoking. The pt will follow-up with Katie next week and we will plan to repeat Chest CT in 3 months.

## 2018-04-28 NOTE — Progress Notes (Signed)
[facility]                                       Cardiology Office Note    Date:  04/29/2018   ID:  Travis Murphy, DOB 17-Sep-1934, MRN [id number]  PCP:  Asencion Noble, MD  Cardiologist: Dr. Domenic Polite / Dr. Angelena Form & Dr. Roxy Manns (TAVR)  CC: [facility] s/p TAVR   History of Present Illness:  Travis Murphy is a 83 y.o. male with a history of HTN, HLD, ongoing tobacco abuse and severe AS s/p TAVR (04/21/18) who presents to clinic for follow up.   The patient states that he has had a heart murmur for many years. He has been followed carefully initially by Dr. Willey Blade and more recently by Dr. Domenic Polite. Echocardiograms have demonstrated the presence of aortic stenosis that has gradually progressed in severity. Left ventricular function has remained normal. Patient claimed to be asymptomatic until fairly recently. The patient now admits to gradual decline in his exercise tolerance with worsening fatigue and exertional shortness of breath. Follow-up echocardiogram performed further progression of his aortic stenosis with peak velocity across the aortic valve measured 4.8 m/s corresponding to mean transvalvular gradient estimated 58 mmHg. The DVI was quite low at 0.16 and aortic valve area calculated 0.51 cm. The patient was referred to the multidisciplinary heart valve clinic and has been evaluated previously by Dr. Angelena Form. Diagnostic cardiac catheterization performed March 25, 2018 confirmed the presence of severe aortic stenosis with peak to peak and mean transvalvular gradientsmeasured 43 and 38 mmHg respectively, corresponding to aortic valve area calculated 0.70 cm. Patient was noted to have moderate nonobstructive three-vessel coronary artery disease.   He underwent successful TAVR with a52mm Edwards Sapien 3 THV via the TF approach on 04/21/18. Post operative echoshowed EF 60-65%, normally functioning TAVR with mild-mod PVL, mean  gradient 15 mm Hg. He was discharged the following day on ASA and plavix.   Today he presents to clinic for follow up. No CP or SOB. No LE edema, orthopnea or PND. No dizziness or syncope. He sometimes feels a little "wobby" when he turns around when up walking. No blood in stool or urine. No palpitations. He has been walking at least a mile everyday with no issues. He feels like he can breath much easier.    Past Medical History:  Diagnosis Date  . Arthritis    "a little bit; all over" (04/21/2018)  . Essential hypertension   . Heart murmur   . Hyperlipemia   . Hyperplastic colon polyp 2002   Dr. Arnoldo Morale  . Pulmonary nodule    a. right lower lobe. Seen on pre TAVR CT scan and will need follow up  . 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 Severe aortic stenosis    a. severe AS by echo in 2017 b. 01/2018: repeat echo showing EF of 55-60%, Grade 2 DD, and severe AS with mean gradient of 58 mm Hg  . Tobacco abuse   . Vertigo     Past Surgical History:  Procedure Laterality Date  . CATARACT EXTRACTION Right   . COLONOSCOPY  08/17/2001   Small polyp measured 3 mm removed/multiple middle scattered diverticula in the ascending colon-hyperplastic  . COLONOSCOPY  08/14/2011  Procedure: COLONOSCOPY;  Surgeon: Daneil Dolin, MD;  Location: [facility];  Service: Endoscopy;  Laterality: N/A;  10:00  . EXCISIONAL HEMORRHOIDECTOMY    . INTRAOPERATIVE TRANSTHORACIC ECHOCARDIOGRAM N/A 04/21/2018   Procedure: INTRAOPERATIVE TRANSTHORACIC ECHOCARDIOGRAM;  Surgeon: Burnell Blanks, MD;  Location: [facility];  Service: Open Heart Surgery;  Laterality: N/A;  . RIGHT/LEFT HEART CATH AND CORONARY ANGIOGRAPHY N/A 03/25/2018   Procedure: RIGHT/LEFT HEART CATH AND CORONARY ANGIOGRAPHY;  Surgeon: Burnell Blanks, MD;  Location: [facility];  Service:  Cardiovascular;  Laterality: N/A;  . TRANSCATHETER AORTIC VALVE REPLACEMENT, TRANSFEMORAL  04/21/2018  . TRANSCATHETER AORTIC VALVE REPLACEMENT, TRANSFEMORAL N/A 04/21/2018   Procedure: TRANSCATHETER AORTIC VALVE REPLACEMENT, TRANSFEMORAL;  Surgeon: Burnell Blanks, MD;  Location: [facility];  Service: Open Heart Surgery;  Laterality: N/A;    Current Medications: Outpatient Medications Prior to Visit  Medication Sig Dispense Refill  . aspirin EC 81 MG tablet Take 81 mg by mouth daily.      Marland Kitchen atorvastatin (LIPITOR) 10 MG tablet Take 10 mg by mouth daily.  4  . clopidogrel (PLAVIX) 75 MG tablet Take 1 tablet (75 mg total) by mouth daily with breakfast. 90 tablet 1  . meclizine (ANTIVERT) 25 MG tablet Take 25 mg by mouth as directed.    . ramipril (ALTACE) 10 MG capsule Take 10 mg by mouth daily.     . meclizine (ANTIVERT) 25 MG tablet Take 1 tablet (25 mg total) by mouth 4 (four) times daily. (Patient not taking: Reported on 04/29/2018) 2 tablet 0   No facility-administered medications prior to visit.      Allergies:   Patient has no known allergies.   Social History   Socioeconomic History  . Marital status: Widowed    Spouse name: Not on file  . Number of children: 2  . Years of education: Not on file  . Highest education level: Not on file  Occupational History  . Occupation: retired; [employer]  . Financial resource strain: Not on file  . Food insecurity:    Worry: Not on file    Inability: Not on file  . Transportation needs:    Medical: Not on file    Non-medical: Not on file  Tobacco Use  . Smoking status: Current Every Day Smoker    Packs/day: 1.00    Years: 65.00    Pack years: 65.00    Types: Cigarettes  . Smokeless tobacco: Never Used  Substance and Sexual Activity  . Alcohol use: Not Currently    Alcohol/week: 0.0 standard drinks  . Drug use: Never  . Sexual activity: Not Currently  Lifestyle  . Physical activity:    Days per week: Not on  file    Minutes per session: Not on file  . Stress: Not on file  Relationships  . Social connections:    Talks on phone: Not on file    Gets together: Not on file    Attends religious service: Not on file    Active member of club or organization: Not on file    Attends meetings of clubs or organizations: Not on file    Relationship status: Not on file  Other Topics Concern  . Not on file  Social History Narrative   Widower (married for 40yrs); Remarried in 2016        Family History:  The patient's family history includes Heart attack in his father; Liver disease in his sister; Renal Disease in  his brother; Stroke in his mother.      ROS:   Please see the history of present illness.    ROS All other systems reviewed and are negative.   PHYSICAL EXAM:   VS:  BP 108/60   Pulse 71   Ht 5\' 5"  (1.651 m)   Wt 148 lb 3.2 oz (67.2 kg)   SpO2 94%   BMI 24.66 kg/m    GEN: Well nourished, well developed, in no acute distress  HEENT: normal  Neck: no JVD, carotid bruits, or masses Cardiac: RRR; soft flow murmur. No rubs, or gallops,no edema  Respiratory:  clear to auscultation bilaterally, normal work of breathing GI: soft, nontender, nondistended, + BS MS: no deformity or atrophy  Skin: warm and dry, no rash. Groin sites look good. Resolving ecchymosis on pubic bone  Neuro:  Alert and Oriented x 3, Strength and sensation are intact Psych: euthymic mood, full affect    Wt Readings from Last 3 Encounters:  04/29/18 148 lb 3.2 oz (67.2 kg)  04/22/18 153 lb (69.4 kg)  04/17/18 149 lb (67.6 kg)      Studies/Labs Reviewed:   EKG:  EKG is ordered today.  The ekg ordered today demonstrates NSR HR 70  Recent Labs: 04/17/2018: ALT 21; B Natriuretic Peptide 352.3 04/22/2018: BUN 10; Creatinine, Ser 0.75; Hemoglobin 13.6; Magnesium 2.1; Platelets 115; Potassium 3.6; Sodium 140   Lipid Panel No results found for: CHOL, TRIG, HDL, CHOLHDL, VLDL, LDLCALC, LDLDIRECT  Additional  studies/ records that were reviewed today include:  TAVR OPERATIVE NOTE   Date of Procedure:04/21/2018  Preoperative Diagnosis:Severe Aortic Stenosis  Procedure:   Transcatheter Aortic Valve Replacement - PercutaneousRightTransfemoral Approach Edwards Sapien 3 THV (size 86mm, model # 9600TFX, serial # [id number])  Co-Surgeons:Clarence H. Roxy Manns, MD and Lauree Chandler, MD  Pre-operative Echo Findings: ? Severe aortic stenosis ? Normalleft ventricular systolic function  Post-operative Echo Findings: ? Trivialparavalvular leak ? Normalleft ventricular systolic function  _____________   2D ECHO 04/22/18 Study Conclusions - Left ventricle: The cavity size was normal. There was severe focal basal and mild concentric hypertrophy. Systolic function was normal. The estimated ejection fraction was in the range of 60% to 65%. Wall motion was normal; there were no regional wall motion abnormalities. There was an increased relative contribution of atrial contraction to ventricular filling. Doppler parameters are consistent with abnormal left ventricular relaxation (grade 1 diastolic dysfunction). Doppler parameters are consistent with high ventricular filling pressure. - Aortic valve: S/P TAVR with 51mm Edwards Sapien 3 valve which appears to be well functioning. There is mild to moderate perivalvular AI (mild visually and moderate by PHT 347ms). Mean gradient (S): 15 mm Hg. Valve area (VTI): 2.29 cm^2. Valve area (Vmax): 2.09 cm^2. Valve area (Vmean): 2.42 cm^2. Regurgitation pressure half-time: 337 ms. - Mitral valve: There was trivial regurgitation. Valve area by pressure half-time: 2.32 cm^2. - Atrial septum: There was increased thickness of the septum, consistent with lipomatous hypertrophy. - Tricuspid valve: There was trivial regurgitation. - Pulmonic valve: There was  trivial regurgitation. - Pulmonary arteries: Systolic pressure could not be accurately estimated. Impressions: - Severe BSH and mild cLVH, normal LVF, trivial PR/MR/TR. Grade 1 DD. Stable TAVR with normal function and mild to moderate AI (mild visually and moderate by PHT of 316ms). Mean AVG is 37mmHg.    ASSESSMENT & PLAN:   Severe AS s/p TAVR: doing excellent. Feeling great and walking a mile everyday. He can tell a big difference in his breathing. Groin sites  are stable. Continue ASA and plavix. SBE prophylaxis discussed and Amoxicillin called into pharmacy. I will see him back in 1 month for follow up and echo.   HTN: BP well controlled today   HLD: continue statin   Lung nodule: RLL nodule was found on pre TAVR CT that will require 3 month follow up CT. He is a current smoker and high risk for possible malignancy. I will have this arranged today.   AAA: 3.1 cm infrarenal AAA that requires 3 year follow up. I will defer to primary cardiologist.    Medication Adjustments/Labs and Tests Ordered: Current medicines are reviewed at length with the patient today.  Concerns regarding medicines are outlined above.  Medication changes, Labs and Tests ordered today are listed in the Patient Instructions below. Patient Instructions  Medication Instructions:  Your physician has recommended you make the following change in your medication:   TAKE Amoxicillin 2000 mg (4 tabs) 1 hour before any dental work or procedures   Labwork: None Ordered   Testing/Procedures: In 3 months you need a Non-Cardiac CT scanning, (CAT scanning), is a noninvasive, special x-ray that produces cross-sectional images of the body using x-rays and a computer. CT scans help physicians diagnose and treat medical conditions. For some CT exams, a contrast material is used to enhance visibility in the area of the body being studied. CT scans provide greater clarity and reveal more details than regular x-ray  exams.   Follow-Up: Your physician recommends that you keep your follow-up appointment in: 1 month with Nell Range, PA   If you need a refill on your cardiac medications before your next appointment, please call your pharmacy.   Thank you for choosing [facility]! Christen Bame, RN [phone]       Signed, Angelena Form, PA-C  04/29/2018 4:19 PM    [facility] [place], [place], [place]  [address] Phone: [phone]; Fax: [phone]

## 2018-04-29 ENCOUNTER — Encounter: Payer: Self-pay | Admitting: Physician Assistant

## 2018-04-29 ENCOUNTER — Ambulatory Visit (INDEPENDENT_AMBULATORY_CARE_PROVIDER_SITE_OTHER): Payer: Medicare Other | Admitting: Physician Assistant

## 2018-04-29 VITALS — BP 108/60 | HR 71 | Ht 65.0 in | Wt 148.2 lb

## 2018-04-29 DIAGNOSIS — E785 Hyperlipidemia, unspecified: Secondary | ICD-10-CM

## 2018-04-29 DIAGNOSIS — R911 Solitary pulmonary nodule: Secondary | ICD-10-CM

## 2018-04-29 DIAGNOSIS — I714 Abdominal aortic aneurysm, without rupture, unspecified: Secondary | ICD-10-CM

## 2018-04-29 DIAGNOSIS — Z952 Presence of prosthetic heart valve: Secondary | ICD-10-CM

## 2018-04-29 DIAGNOSIS — I10 Essential (primary) hypertension: Secondary | ICD-10-CM

## 2018-04-29 MED ORDER — AMOXICILLIN 500 MG PO TABS: ORAL_TABLET | ORAL | 4 refills | Status: DC

## 2018-04-29 NOTE — Patient Instructions (Addendum)
Medication Instructions:  Your physician has recommended you make the following change in your medication:   TAKE Amoxicillin 2000 mg (4 tabs) 1 hour before any dental work or procedures   Labwork: None Ordered   Testing/Procedures: In 3 months you need a Non-Cardiac CT scanning, (CAT scanning), is a noninvasive, special x-ray that produces cross-sectional images of the body using x-rays and a computer. CT scans help physicians diagnose and treat medical conditions. For some CT exams, a contrast material is used to enhance visibility in the area of the body being studied. CT scans provide greater clarity and reveal more details than regular x-ray exams.   Follow-Up: Your physician recommends that you keep your follow-up appointment in: 1 month with Nell Range, PA   If you need a refill on your cardiac medications before your next appointment, please call your pharmacy.   Thank you for choosing [facility]! Christen Bame, RN [phone]

## 2018-05-14 ENCOUNTER — Encounter: Payer: Self-pay | Admitting: Physician Assistant

## 2018-05-14 DIAGNOSIS — E1129 Type 2 diabetes mellitus with other diabetic kidney complication: Secondary | ICD-10-CM | POA: Diagnosis not present

## 2018-05-18 ENCOUNTER — Encounter: Payer: Self-pay | Admitting: Thoracic Surgery (Cardiothoracic Vascular Surgery)

## 2018-05-20 NOTE — Progress Notes (Addendum)
[facility] [place]                                       Cardiology Office Note    Date:  05/21/2018   ID:  Travis Murphy, DOB September 30, 1934, MRN [id number]  PCP:  Asencion Noble, MD  Cardiologist: Dr. Domenic Polite / Dr. Angelena Form & Dr. Roxy Manns (TAVR)  CC: 1 month s/p TAVR   History of Present Illness:  Travis Murphy is a 83 y.o. male with a history of HTN, HLD, ongoing tobacco abuse and severe AS s/p TAVR (04/21/18) who presents to clinic for follow up.   The patient states that he has had a heart murmur for many years. He has been followed carefully initially by Dr. Willey Blade and more recently by Dr. Domenic Polite. Echocardiograms have demonstrated the presence of aortic stenosis that has gradually progressed in severity. Left ventricular function has remained normal. Patient claimed to be asymptomatic until fairly recently. The patient now admits to gradual decline in his exercise tolerance with worsening fatigue and exertional shortness of breath. Follow-up echocardiogram performed further progression of his aortic stenosis with peak velocity across the aortic valve measured 4.8 m/s corresponding to mean transvalvular gradient estimated 58 mmHg. The DVI was quite low at 0.16 and aortic valve area calculated 0.51 cm. The patient was referred to the multidisciplinary heart valve clinic and has been evaluated previously by Dr. Angelena Form. Diagnostic cardiac catheterization performed March 25, 2018 confirmed the presence of severe aortic stenosis with peak to peak and mean transvalvular gradientsmeasured 43 and 38 mmHg respectively, corresponding to aortic valve area calculated 0.70 cm. Patient was noted to have moderate nonobstructive three-vessel coronary artery disease.   He underwent successful TAVR with a28mm Edwards Sapien 3 THV via the TF approach on 04/21/18. Post operative echoshowed EF 60-65%, normally functioning TAVR with mild-mod PVL, mean  gradient 15 mm Hg. He was discharged the following day on ASA and plavix.   Today he presents to clinic for follow up. He has been having some fatigue that he thinks is related to the heat. He has been taking naps. No weakness. He has been going to [employer] and walks around about a 1/2 mile with no issues. No CP or SOB. He only gets short of breath with strenuous activity. No LE edema, orthopnea or PND. No dizziness or syncope. No blood in stool or urine. No palpitations. He enjoys going to his neighbors house visiting their 74 year old. He mowed his lawn the other day and looks forward to getting back on the golf course.    Past Medical History:  Diagnosis Date  . Arthritis    "a little bit; all over" (04/21/2018)  . Essential hypertension   . Heart murmur   . Hyperlipemia   . Hyperplastic colon polyp 2002   Dr. Arnoldo Morale  . Pulmonary nodule    a. right lower lobe. Seen on pre TAVR CT scan and will need follow up  . 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 Severe aortic stenosis    a. severe AS by echo in 2017 b. 01/2018: repeat echo showing EF of 55-60%, Grade 2 DD, and severe AS with mean gradient of 58 mm Hg  . Tobacco abuse   . Vertigo  Past Surgical History:  Procedure Laterality Date  . CATARACT EXTRACTION Right   . COLONOSCOPY  08/17/2001   Small polyp measured 3 mm removed/multiple middle scattered diverticula in the ascending colon-hyperplastic  . COLONOSCOPY  08/14/2011   Procedure: COLONOSCOPY;  Surgeon: Daneil Dolin, MD;  [facility];  Service: Endoscopy;  Laterality: N/A;  10:00  . EXCISIONAL HEMORRHOIDECTOMY    . INTRAOPERATIVE TRANSTHORACIC ECHOCARDIOGRAM N/A 04/21/2018   Procedure: INTRAOPERATIVE TRANSTHORACIC ECHOCARDIOGRAM;  Surgeon: Burnell Blanks, MD;  Location: [facility];  Service: Open Heart Surgery;  Laterality: N/A;    . RIGHT/LEFT HEART CATH AND CORONARY ANGIOGRAPHY N/A 03/25/2018   Procedure: RIGHT/LEFT HEART CATH AND CORONARY ANGIOGRAPHY;  Surgeon: Burnell Blanks, MD;  Location: [facility];  Service: Cardiovascular;  Laterality: N/A;  . TRANSCATHETER AORTIC VALVE REPLACEMENT, TRANSFEMORAL  04/21/2018  . TRANSCATHETER AORTIC VALVE REPLACEMENT, TRANSFEMORAL N/A 04/21/2018   Procedure: TRANSCATHETER AORTIC VALVE REPLACEMENT, TRANSFEMORAL;  Surgeon: Burnell Blanks, MD;  Location: [facility];  Service: Open Heart Surgery;  Laterality: N/A;    Current Medications: Outpatient Medications Prior to Visit  Medication Sig Dispense Refill  . amoxicillin (AMOXIL) 500 MG tablet Take 2000 mg (4 tabs) 1 hour before any dental work or procedures 4 tablet 4  . aspirin EC 81 MG tablet Take 81 mg by mouth daily.      Marland Kitchen atorvastatin (LIPITOR) 10 MG tablet Take 10 mg by mouth daily.  4  . clopidogrel (PLAVIX) 75 MG tablet Take 1 tablet (75 mg total) by mouth daily with breakfast. 90 tablet 1  . meclizine (ANTIVERT) 25 MG tablet Take 25 mg by mouth as directed.    . ramipril (ALTACE) 10 MG capsule Take 10 mg by mouth daily.      No facility-administered medications prior to visit.      Allergies:   Patient has no known allergies.   Social History   Socioeconomic History  . Marital status: Widowed    Spouse name: Not on file  . Number of children: 2  . Years of education: Not on file  . Highest education level: Not on file  Occupational History  . Occupation: retired; [employer]  . Financial resource strain: Not on file  . Food insecurity:    Worry: Not on file    Inability: Not on file  . Transportation needs:    Medical: Not on file    Non-medical: Not on file  Tobacco Use  . Smoking status: Current Every Day Smoker    Packs/day: 1.00    Years: 65.00    Pack years: 65.00    Types: Cigarettes  . Smokeless tobacco: Never Used  Substance and Sexual Activity  . Alcohol  use: Not Currently    Alcohol/week: 0.0 standard drinks  . Drug use: Never  . Sexual activity: Not Currently  Lifestyle  . Physical activity:    Days per week: Not on file    Minutes per session: Not on file  . Stress: Not on file  Relationships  . Social connections:    Talks on phone: Not on file    Gets together: Not on file    Attends religious service: Not on file    Active member of club or organization: Not on file    Attends meetings of clubs or organizations: Not on file    Relationship status: Not on file  Other Topics Concern  . Not on file  Social History Narrative  Widower (married for 21yrs); Remarried in 2016        Family History:  The patient's family history includes Heart attack in his father; Liver disease in his sister; Renal Disease in his brother; Stroke in his mother.      ROS:   Please see the history of present illness.    ROS All other systems reviewed and are negative.   PHYSICAL EXAM:   VS:  BP 114/66   Pulse 87   Ht 5\' 5"  (1.651 m)   Wt 148 lb (67.1 kg)   BMI 24.63 kg/m    GEN: Well nourished, well developed, in no acute distress  HEENT: normal  Neck: no JVD, carotid bruits, or masses Cardiac: RRR; soft flow murmur. No rubs, or gallops,no edema  Respiratory: course breath sounds GI: soft, nontender, nondistended, + BS MS: no deformity or atrophy  Skin: warm and dry, no rash Neuro:  Alert and Oriented x 3, Strength and sensation are intact Psych: euthymic mood, full affect    Wt Readings from Last 3 Encounters:  05/21/18 148 lb (67.1 kg)  04/29/18 148 lb 3.2 oz (67.2 kg)  04/22/18 153 lb (69.4 kg)      Studies/Labs Reviewed:   EKG:  EKG is NOT ordered today.    Recent Labs: 04/17/2018: ALT 21; B Natriuretic Peptide 352.3 04/22/2018: BUN 10; Creatinine, Ser 0.75; Hemoglobin 13.6; Magnesium 2.1; Platelets 115; Potassium 3.6; Sodium 140   Lipid Panel No results found for: CHOL, TRIG, HDL, CHOLHDL, VLDL, LDLCALC,  LDLDIRECT  Additional studies/ records that were reviewed today include:  TAVR OPERATIVE NOTE   Date of Procedure:04/21/2018  Preoperative Diagnosis:Severe Aortic Stenosis  Procedure:   Transcatheter Aortic Valve Replacement - PercutaneousRightTransfemoral Approach Edwards Sapien 3 THV (size 39mm, model # 9600TFX, serial # [id number])  Co-Surgeons:Clarence H. Roxy Manns, MD and Lauree Chandler, MD  Pre-operative Echo Findings: ? Severe aortic stenosis ? Normalleft ventricular systolic function  Post-operative Echo Findings: ? Trivialparavalvular leak ? Normalleft ventricular systolic function  _____________   2D ECHO 04/22/18 Study Conclusions - Left ventricle: The cavity size was normal. There was severe focal basal and mild concentric hypertrophy. Systolic function was normal. The estimated ejection fraction was in the range of 60% to 65%. Wall motion was normal; there were no regional wall motion abnormalities. There was an increased relative contribution of atrial contraction to ventricular filling. Doppler parameters are consistent with abnormal left ventricular relaxation (grade 1 diastolic dysfunction). Doppler parameters are consistent with high ventricular filling pressure. - Aortic valve: S/P TAVR with 35mm Edwards Sapien 3 valve which appears to be well functioning. There is mild to moderate perivalvular AI (mild visually and moderate by PHT 331ms). Mean gradient (S): 15 mm Hg. Valve area (VTI): 2.29 cm^2. Valve area (Vmax): 2.09 cm^2. Valve area (Vmean): 2.42 cm^2. Regurgitation pressure half-time: 337 ms. - Mitral valve: There was trivial regurgitation. Valve area by pressure half-time: 2.32 cm^2. - Atrial septum: There was increased thickness of the septum, consistent with lipomatous hypertrophy. - Tricuspid valve: There was trivial regurgitation. -  Pulmonic valve: There was trivial regurgitation. - Pulmonary arteries: Systolic pressure could not be accurately estimated. Impressions: - Severe BSH and mild cLVH, normal LVF, trivial PR/MR/TR. Grade 1 DD. Stable TAVR with normal function and mild to moderate AI (mild visually and moderate by PHT of 327ms). Mean AVG is 14mmHg.  ______________  2D ECHO 05/21/18 (1 month s/p TAVR) Study Conclusions - Left ventricle: The cavity size was normal. There was mild  concentric hypertrophy. Systolic function was normal. The   estimated ejection fraction was in the range of 60% to 65%. Wall   motion was normal; there were no regional wall motion   abnormalities. Doppler parameters are consistent with abnormal   left ventricular relaxation (grade 1 diastolic dysfunction).   Doppler parameters are consistent with elevated mean left atrial   filling pressure. - Aortic valve: A stent-valve (TAVR) bioprosthesis was present.   There was trivial perivalvular regurgitation at the mitral aspct   of the valve annulus.. - Left atrium: The atrium was moderately dilated. Impressions: - Compared to the previous study, the aortic prosthesis gradients   are unchanged and there is less perivalvular regurgitation.  ASSESSMENT & PLAN:   Severe AS s/p TAVR: 2D ECHO today shows EF 60%, normally functioning TAVR with mean gradient 16 mm Hg (previously 15 mmHg) and trivial PVL.  He has NYHA class I symptoms. SBE prophylaxis discussed; he has Amoxicillin. Plavix can be discontinued after 6 months of therapy (04/21/18)  HTN:  BP well controlled. Continue current therapy  HLD: continue statin   Lung nodule: RLL nodule was found on pre TAVR CT that will require 3 month follow up CT. He is a current smoker and high risk for possible malignancy. Set up for December.   AAA: 3.1 cm infrarenal AAA that requires 3 year follow up. I will defer to primary cardiologist.    Medication Adjustments/Labs and Tests  Ordered: Current medicines are reviewed at length with the patient today.  Concerns regarding medicines are outlined above.  Medication changes, Labs and Tests ordered today are listed in the Patient Instructions below. Patient Instructions  Medication Instructions:  1) You may stop your Plavix after 10/22/2018.  Please continue your Aspirin.    Labwork: None  Testing/Procedures: Your physician has requested that you have an echocardiogram. Echocardiography is a painless test that uses sound waves to create images of your heart. It provides your doctor with information about the size and shape of your heart and how well your heart's chambers and valves are working. This procedure takes approximately one hour. There are no restrictions for this procedure.   Follow-Up: Your physician recommends that you schedule a follow-up appointment in: 3 months with Dr. Domenic Polite.   You will follow up with Bonney Leitz, PA-C in one year.  You will have your echocardiogram the same day.  Theodosia Quay, RN or Bonney Leitz, PA-C will contact you to get this scheduled.  Any Other Special Instructions Will Be Listed Below (If Applicable).     If you need a refill on your cardiac medications before your next appointment, please call your pharmacy.      Signed, Angelena Form, PA-C  05/21/2018 6:48 PM    [facility] [address], [place], [place]  [address] Phone: [phone]; Fax: [phone]

## 2018-05-21 ENCOUNTER — Ambulatory Visit (INDEPENDENT_AMBULATORY_CARE_PROVIDER_SITE_OTHER): Payer: Medicare Other | Admitting: Physician Assistant

## 2018-05-21 ENCOUNTER — Ambulatory Visit (HOSPITAL_COMMUNITY): Payer: Medicare Other | Attending: Cardiovascular Disease

## 2018-05-21 ENCOUNTER — Other Ambulatory Visit: Payer: Self-pay

## 2018-05-21 VITALS — BP 114/66 | HR 87 | Ht 65.0 in | Wt 148.0 lb

## 2018-05-21 DIAGNOSIS — I10 Essential (primary) hypertension: Secondary | ICD-10-CM | POA: Diagnosis not present

## 2018-05-21 DIAGNOSIS — Z952 Presence of prosthetic heart valve: Secondary | ICD-10-CM | POA: Diagnosis not present

## 2018-05-21 DIAGNOSIS — E1129 Type 2 diabetes mellitus with other diabetic kidney complication: Secondary | ICD-10-CM | POA: Diagnosis not present

## 2018-05-21 DIAGNOSIS — F17210 Nicotine dependence, cigarettes, uncomplicated: Secondary | ICD-10-CM | POA: Insufficient documentation

## 2018-05-21 DIAGNOSIS — I714 Abdominal aortic aneurysm, without rupture, unspecified: Secondary | ICD-10-CM

## 2018-05-21 DIAGNOSIS — Z48812 Encounter for surgical aftercare following surgery on the circulatory system: Secondary | ICD-10-CM | POA: Diagnosis not present

## 2018-05-21 DIAGNOSIS — Z953 Presence of xenogenic heart valve: Secondary | ICD-10-CM | POA: Insufficient documentation

## 2018-05-21 DIAGNOSIS — I119 Hypertensive heart disease without heart failure: Secondary | ICD-10-CM | POA: Insufficient documentation

## 2018-05-21 DIAGNOSIS — R911 Solitary pulmonary nodule: Secondary | ICD-10-CM

## 2018-05-21 DIAGNOSIS — E785 Hyperlipidemia, unspecified: Secondary | ICD-10-CM | POA: Insufficient documentation

## 2018-05-21 LAB — ECHOCARDIOGRAM COMPLETE
HEIGHTINCHES: 65 in
Weight: 2368 oz

## 2018-05-21 NOTE — Patient Instructions (Signed)
Medication Instructions:  1) You may stop your Plavix after 10/22/2018.  Please continue your Aspirin.    Labwork: None  Testing/Procedures: Your physician has requested that you have an echocardiogram. Echocardiography is a painless test that uses sound waves to create images of your heart. It provides your doctor with information about the size and shape of your heart and how well your heart's chambers and valves are working. This procedure takes approximately one hour. There are no restrictions for this procedure.   Follow-Up: Your physician recommends that you schedule a follow-up appointment in: 3 months with Dr. Domenic Polite.   You will follow up with Bonney Leitz, PA-C in one year.  You will have your echocardiogram the same day.  Theodosia Quay, RN or Bonney Leitz, PA-C will contact you to get this scheduled.  Any Other Special Instructions Will Be Listed Below (If Applicable).     If you need a refill on your cardiac medications before your next appointment, please call your pharmacy.

## 2018-05-22 ENCOUNTER — Ambulatory Visit: Payer: Medicare Other | Admitting: Cardiology

## 2018-05-28 ENCOUNTER — Ambulatory Visit: Payer: Medicare Other | Admitting: Physician Assistant

## 2018-05-28 ENCOUNTER — Other Ambulatory Visit (HOSPITAL_COMMUNITY): Payer: Medicare Other

## 2018-05-29 ENCOUNTER — Encounter: Payer: Self-pay | Admitting: Thoracic Surgery (Cardiothoracic Vascular Surgery)

## 2018-07-29 ENCOUNTER — Ambulatory Visit (INDEPENDENT_AMBULATORY_CARE_PROVIDER_SITE_OTHER)
Admission: RE | Admit: 2018-07-29 | Discharge: 2018-07-29 | Disposition: A | Discharge status: Home / Self Care | Payer: Medicare Other | Source: Ambulatory Visit | Attending: Physician Assistant | Admitting: Physician Assistant

## 2018-07-29 DIAGNOSIS — J432 Centrilobular emphysema: Secondary | ICD-10-CM | POA: Diagnosis not present

## 2018-07-29 DIAGNOSIS — R911 Solitary pulmonary nodule: Secondary | ICD-10-CM | POA: Diagnosis not present

## 2018-07-29 DIAGNOSIS — I714 Abdominal aortic aneurysm, without rupture, unspecified: Secondary | ICD-10-CM

## 2018-08-09 ENCOUNTER — Other Ambulatory Visit (HOSPITAL_COMMUNITY): Payer: Self-pay | Admitting: Physician Assistant

## 2018-08-10 ENCOUNTER — Encounter: Payer: Self-pay | Admitting: Cardiology

## 2018-08-10 NOTE — Progress Notes (Signed)
Cardiology Office Note  Date: 08/11/2018   ID: [name], DOB 02/13/1935, MRN [id number]  PCP: Asencion Noble, MD  Primary Cardiologist: Rozann Lesches, MD   Chief Complaint  Patient presents with  . Aortic Stenosis    History of Present Illness: Travis Murphy is an 83 y.o. male that I have not seen in the office since 2017.  Most recent follow-up has been with Ms. Ahmed Prima PA-C and Dr. Angelena Form.  He is now status post successful TAVR with a49mm Edwards Sapien 3 THV via the TF approach on 04/21/18.  He presents today for follow-up, doing well, states that he feels better and has more stamina in general.  He walks at [facility] for about an hour each day for exercise.  Currently describes NYHA class II dyspnea, no chest pain or palpitations.  He should be able to stop Plavix after February 2020.  We discussed this, he will continue aspirin however.  Hd continues to follow with Dr. Willey Blade for primary care.  He had a follow-up echocardiogram in September and also a chest CT done more recently.  Results are outlined below.  He does not have any active symptoms including cough or chest congestion, no fevers or chills.  Past Medical History:  Diagnosis Date  . Arthritis   . Essential hypertension   . Hyperlipemia   . Hyperplastic colon polyp 2002   Dr. Arnoldo Morale  . Pulmonary nodule    a. right lower lobe. Seen on pre TAVR CT scan and will need follow up  . 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 Severe aortic stenosis    a. severe AS by echo in 2017 b. 01/2018: repeat echo showing EF of 55-60%, Grade 2 DD, and severe AS with mean gradient of 58 mm Hg  . Vertigo     Past Surgical History:  Procedure Laterality Date  . CATARACT EXTRACTION Right   . COLONOSCOPY  08/17/2001   Small polyp measured 3 mm removed/multiple middle scattered diverticula in  the ascending colon-hyperplastic  . COLONOSCOPY  08/14/2011   Procedure: COLONOSCOPY;  Surgeon: Daneil Dolin, MD;  [facility];  Service: Endoscopy;  Laterality: N/A;  10:00  . EXCISIONAL HEMORRHOIDECTOMY    . INTRAOPERATIVE TRANSTHORACIC ECHOCARDIOGRAM N/A 04/21/2018   Procedure: INTRAOPERATIVE TRANSTHORACIC ECHOCARDIOGRAM;  Surgeon: Burnell Blanks, MD;  Location: [facility];  Service: Open Heart Surgery;  Laterality: N/A;  . RIGHT/LEFT HEART CATH AND CORONARY ANGIOGRAPHY N/A 03/25/2018   Procedure: RIGHT/LEFT HEART CATH AND CORONARY ANGIOGRAPHY;  Surgeon: Burnell Blanks, MD;  Location: [facility];  Service: Cardiovascular;  Laterality: N/A;  . TRANSCATHETER AORTIC VALVE REPLACEMENT, TRANSFEMORAL  04/21/2018  . TRANSCATHETER AORTIC VALVE REPLACEMENT, TRANSFEMORAL N/A 04/21/2018   Procedure: TRANSCATHETER AORTIC VALVE REPLACEMENT, TRANSFEMORAL;  Surgeon: Burnell Blanks, MD;  Location: [facility];  Service: Open Heart Surgery;  Laterality: N/A;    Current Outpatient Medications  Medication Sig Dispense Refill  . amoxicillin (AMOXIL) 500 MG tablet Take 2000 mg (4 tabs) 1 hour before any dental work or procedures 4 tablet 4  . aspirin EC 81 MG tablet Take 81 mg by mouth daily.      Marland Kitchen atorvastatin (LIPITOR) 10 MG tablet Take 10 mg by mouth daily.  4  . clopidogrel (PLAVIX) 75 MG tablet TAKE 1 TABLET (75 MG TOTAL) BY MOUTH DAILY WITH BREAKFAST. 90 tablet 2  .  meclizine (ANTIVERT) 25 MG tablet Take 25 mg by mouth as directed.    . ramipril (ALTACE) 10 MG capsule Take 10 mg by mouth daily.      No current facility-administered medications for this visit.    Allergies:  Patient has no known allergies.   Social History: The patient  reports that he has been smoking cigarettes. He has a 65.00 pack-year smoking history. He has never used smokeless tobacco. He reports previous alcohol use. He reports that he does not use drugs.   ROS:  Please see the history of  present illness. Otherwise, complete review of systems is positive for none.  All other systems are reviewed and negative.   Physical Exam: VS:  BP 120/70 (BP Location: Right Arm)   Pulse 80   Ht 5\' 4"  (1.626 m)   Wt 143 lb (64.9 kg)   SpO2 93%   BMI 24.55 kg/m , BMI Body mass index is 24.55 kg/m.  Wt Readings from Last 3 Encounters:  08/11/18 143 lb (64.9 kg)  05/21/18 148 lb (67.1 kg)  04/29/18 148 lb 3.2 oz (67.2 kg)    General: Elderly male, appears comfortable at rest. HEENT: Conjunctiva and lids normal, oropharynx clear. Neck: Supple, no elevated JVP or carotid bruits, no thyromegaly. Lungs: No crackles or wheezing, nonlabored breathing at rest. Cardiac: Regular rate and rhythm, no S3, 2/6 systolic murmur, no pericardial rub. Abdomen: Soft, nontender, bowel sounds present. Extremities: No pitting edema, distal pulses 2+. Skin: Warm and dry. Musculoskeletal: Mild kyphosis. Neuropsychiatric: Alert and oriented x3, affect grossly appropriate.  ECG: I personally reviewed the tracing from 04/29/2018 which showed sinus rhythm with decreased R wave progression.  Recent Labwork: 04/17/2018: ALT 21; AST 20; B Natriuretic Peptide 352.3 04/22/2018: BUN 10; Creatinine, Ser 0.75; Hemoglobin 13.6; Magnesium 2.1; Platelets 115; Potassium 3.6; Sodium 140   Other Studies Reviewed Today:  Echocardiogram 05/21/2018: Study Conclusions  - Left ventricle: The cavity size was normal. There was mild   concentric hypertrophy. Systolic function was normal. The   estimated ejection fraction was in the range of 60% to 65%. Wall   motion was normal; there were no regional wall motion   abnormalities. Doppler parameters are consistent with abnormal   left ventricular relaxation (grade 1 diastolic dysfunction).   Doppler parameters are consistent with elevated mean left atrial   filling pressure. - Aortic valve: A stent-valve (TAVR) bioprosthesis was present.   There was trivial perivalvular  regurgitation at the mitral aspct   of the valve annulus.. - Left atrium: The atrium was moderately dilated.  Impressions:  - Compared to the previous study, the aortic prosthesis gradients   are unchanged and there is less perivalvular regurgitation.  Chest CT 07/29/2018: IMPRESSION: 1. Slight increased consolidation associated with subpleural cystic change in the medial right lower lobe. An infectious or inflammatory process, superimposed on paraspinal scarring, is favored. Additional new peribronchovascular and clustered nodularity in the lower lobes, also indicative of an infectious process. Consider follow-up CT chest in 4-6 weeks in further evaluation, as clinically indicated. 2. Previously measured central right lower lobe lesion is not readily appreciated on the current exam. 3. Aortic atherosclerosis (ICD10-170.0). Coronary artery calcification. 4. Ascending Aortic aneurysm NOS (ICD10-I71.9). Recommend annual imaging followup by CTA or MRA. This recommendation follows 2010 ACCF/AHA/AATS/ACR/ASA/SCA/SCAI/SIR/STS/SVM Guidelines for the Diagnosis and Management of Patients with Thoracic Aortic Disease. Circulation. 2010; 121: T062-I948. 5.  Emphysema (ICD10-J43.9).  Assessment and Plan:  1.  Severe aortic stenosis status post TAVR in August  2019.  He is doing well with improvement in symptoms, continues to walk for exercise.  Follow-up echocardiogram from September as outlined above.  He will continue aspirin but plan to stop Plavix after February 2020.  We will obtain a follow-up echocardiogram in September of next year.  2.  Possible right lower lobe lung nodule, not evident by follow-up chest CT in early December.  He had other potential inflammatory changes, however has no active symptoms at this time.  Keep follow-up with Dr. Willey Blade.  3.  Asymptomatic 3.1 cm infrarenal AAA.  No follow-up imaging indicated at this time.  4.  Hyperlipidemia, continues on Lipitor.  Keep  follow-up with Dr. Willey Blade.  5.  Essential hypertension, blood pressure well controlled today on Altace.  Current medicines were reviewed with the patient today.   Orders Placed This Encounter  Procedures  . ECHOCARDIOGRAM COMPLETE    Disposition: Follow-up in September next year.  Signed, Satira Sark, MD, [facility] 08/11/2018 9:36 AM    [facility] [place]. [address], [place], [place] [address] Phone: [phone]; Fax: [phone]

## 2018-08-11 ENCOUNTER — Ambulatory Visit: Payer: Medicare Other | Admitting: Cardiology

## 2018-08-11 ENCOUNTER — Encounter: Payer: Self-pay | Admitting: Cardiology

## 2018-08-11 VITALS — BP 120/70 | HR 80 | Ht 64.0 in | Wt 143.0 lb

## 2018-08-11 DIAGNOSIS — Z952 Presence of prosthetic heart valve: Secondary | ICD-10-CM | POA: Diagnosis not present

## 2018-08-11 DIAGNOSIS — I10 Essential (primary) hypertension: Secondary | ICD-10-CM | POA: Diagnosis not present

## 2018-08-11 DIAGNOSIS — I714 Abdominal aortic aneurysm, without rupture, unspecified: Secondary | ICD-10-CM

## 2018-08-11 DIAGNOSIS — E782 Mixed hyperlipidemia: Secondary | ICD-10-CM

## 2018-08-11 MED ORDER — CLOPIDOGREL BISULFATE 75 MG PO TABS: 75.0000 mg | ORAL_TABLET | Freq: Every day | ORAL | 2 refills | Status: DC

## 2018-08-11 NOTE — Patient Instructions (Addendum)
Medication Instructions:  Your physician recommends that you continue on your current medications as directed. Please refer to the Current Medication list given to you today.STOP Plavix in late February as you already know about  If you need a refill on your cardiac medications before your next appointment, please call your pharmacy.   Lab work: None  If you have labs (blood work) drawn today and your tests are completely normal, you will receive your results only by: Marland Kitchen MyChart Message (if you have MyChart) OR . A paper copy in the mail If you have any lab test that is abnormal or we need to change your treatment, we will call you to review the results.  Testing/Procedures: Your physician has requested that you have an echocardiogram in September 2020 JUST before follow up visit with Dr.McDowell. Echocardiography is a painless test that uses sound waves to create images of your heart. It provides your doctor with information about the size and shape of your heart and how well your heart's chambers and valves are working. This procedure takes approximately one hour. There are no restrictions for this procedure.    Follow-Up: At [facility], you and your health needs are our priority.  As part of our continuing mission to provide you with exceptional heart care, we have created designated Provider Care Teams.  These Care Teams include your primary Cardiologist (physician) and Advanced Practice Providers (APPs -  Physician Assistants and Nurse Practitioners) who all work together to provide you with the care you need, when you need it. You will need a follow up appointment in 9 months.  Please call our office 2 months in advance to schedule this appointment.  You may see Rozann Lesches, MD or one of the following Advanced Practice Providers on your designated Care Team:   Bernerd Pho, PA-C [facility]) . Ermalinda Barrios, PA-C ([place])  Any Other Special Instructions Will  Be Listed Below (If Applicable). None

## 2018-09-15 DIAGNOSIS — E1129 Type 2 diabetes mellitus with other diabetic kidney complication: Secondary | ICD-10-CM | POA: Diagnosis not present

## 2018-09-21 DIAGNOSIS — E1129 Type 2 diabetes mellitus with other diabetic kidney complication: Secondary | ICD-10-CM | POA: Diagnosis not present

## 2018-09-21 DIAGNOSIS — I10 Essential (primary) hypertension: Secondary | ICD-10-CM | POA: Diagnosis not present

## 2019-01-21 DIAGNOSIS — I10 Essential (primary) hypertension: Secondary | ICD-10-CM | POA: Diagnosis not present

## 2019-01-21 DIAGNOSIS — Z952 Presence of prosthetic heart valve: Secondary | ICD-10-CM | POA: Diagnosis not present

## 2019-02-18 DIAGNOSIS — E785 Hyperlipidemia, unspecified: Secondary | ICD-10-CM | POA: Diagnosis not present

## 2019-02-18 DIAGNOSIS — E1129 Type 2 diabetes mellitus with other diabetic kidney complication: Secondary | ICD-10-CM | POA: Diagnosis not present

## 2019-02-18 DIAGNOSIS — I10 Essential (primary) hypertension: Secondary | ICD-10-CM | POA: Diagnosis not present

## 2019-02-18 DIAGNOSIS — I350 Nonrheumatic aortic (valve) stenosis: Secondary | ICD-10-CM | POA: Diagnosis not present

## 2019-02-19 DIAGNOSIS — Z79899 Other long term (current) drug therapy: Secondary | ICD-10-CM | POA: Diagnosis not present

## 2019-02-19 DIAGNOSIS — I10 Essential (primary) hypertension: Secondary | ICD-10-CM | POA: Diagnosis not present

## 2019-02-19 DIAGNOSIS — E785 Hyperlipidemia, unspecified: Secondary | ICD-10-CM | POA: Diagnosis not present

## 2019-02-19 DIAGNOSIS — I350 Nonrheumatic aortic (valve) stenosis: Secondary | ICD-10-CM | POA: Diagnosis not present

## 2019-02-20 IMAGING — DX DG CHEST 1V PORT
1 series · 1 of 1 positions shown · non-contrast
Comparison: 04/17/2018

CLINICAL DATA: Chest congestion

EXAM:
PORTABLE CHEST 1 VIEW

[chest ap]
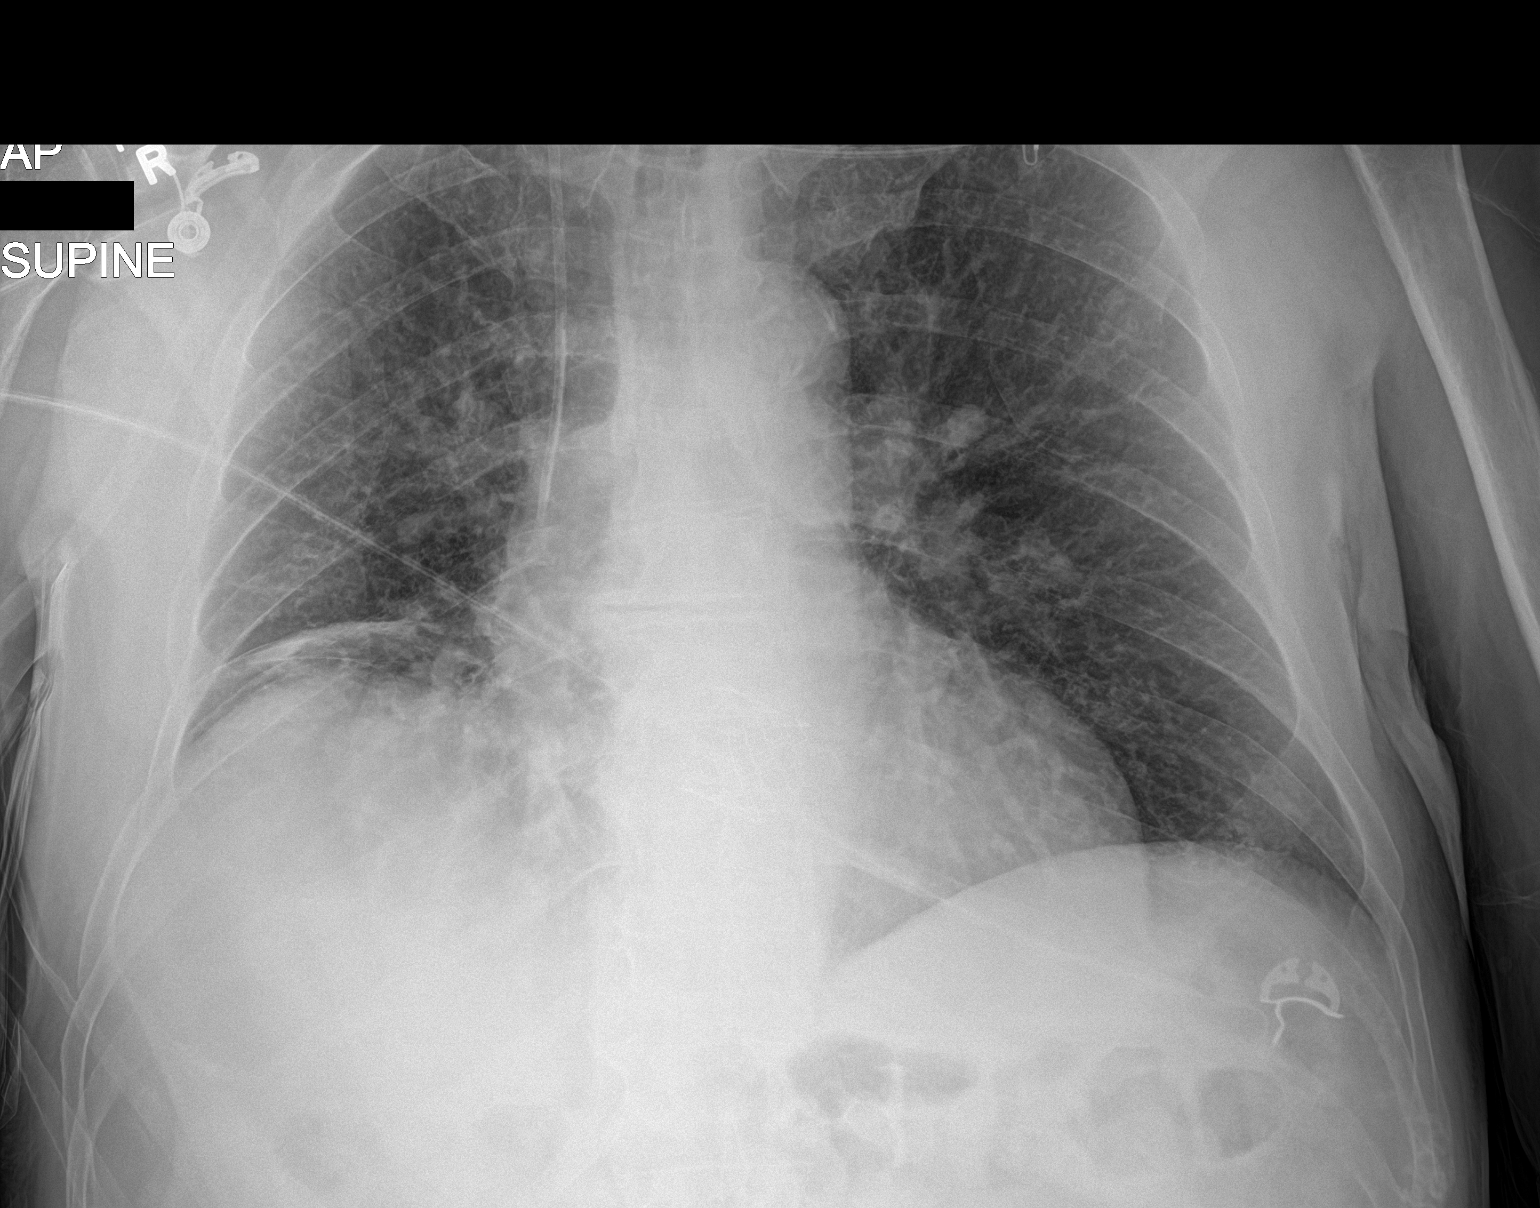

[1 of 1 positions shown; findings below may reference images not displayed]

FINDINGS: Cardiac shadow is stable. Right jugular central line is noted. Right
basilar atelectatic changes are seen. Left lung is clear. No bony
abnormality is noted.
IMPRESSION: Right basilar atelectatic changes.

No pneumothorax following right jugular central line placement

## 2019-04-26 ENCOUNTER — Other Ambulatory Visit (HOSPITAL_COMMUNITY): Payer: Medicare Other

## 2019-05-01 ENCOUNTER — Other Ambulatory Visit: Payer: Self-pay | Admitting: Physician Assistant

## 2019-05-03 ENCOUNTER — Other Ambulatory Visit (HOSPITAL_COMMUNITY): Payer: Medicare Other

## 2019-05-05 ENCOUNTER — Ambulatory Visit (HOSPITAL_COMMUNITY)
Admission: RE | Admit: 2019-05-05 | Discharge: 2019-05-05 | Disposition: A | Discharge status: Home / Self Care | Payer: Medicare Other | Source: Ambulatory Visit | Attending: Cardiology | Admitting: Cardiology

## 2019-05-05 ENCOUNTER — Other Ambulatory Visit: Payer: Self-pay

## 2019-05-05 DIAGNOSIS — Z952 Presence of prosthetic heart valve: Secondary | ICD-10-CM

## 2019-05-05 NOTE — Progress Notes (Signed)
*  PRELIMINARY RESULTS* Echocardiogram 2D Echocardiogram has been performed.  [place], [place] 05/05/2019, 12:41 PM

## 2019-05-11 ENCOUNTER — Telehealth: Payer: Self-pay | Admitting: Cardiology

## 2019-05-11 NOTE — Telephone Encounter (Signed)
Virtual Visit Pre-Appointment Phone Call  "(Name), I am calling you today to discuss your upcoming appointment. We are currently trying to limit exposure to the virus that causes COVID-19 by seeing patients at home rather than in the office."  1. "What is the BEST phone number to call the day of the visit?" - include this in appointment notes  2. Do you have or have access to (through a family member/friend) a smartphone with video capability that we can use for your visit?" a. If yes - list this number in appt notes as cell (if different from BEST phone #) and list the appointment type as a VIDEO visit in appointment notes b. If no - list the appointment type as a PHONE visit in appointment notes  3. Confirm consent - "In the setting of the current Covid19 crisis, you are scheduled for a (phone or video) visit with your provider on (date) at (time).  Just as we do with many in-office visits, in order for you to participate in this visit, we must obtain consent.  If you'd like, I can send this to your mychart (if signed up) or email for you to review.  Otherwise, I can obtain your verbal consent now.  All virtual visits are billed to your insurance company just like a normal visit would be.  By agreeing to a virtual visit, we'd like you to understand that the technology does not allow for your provider to perform an examination, and thus may limit your provider's ability to fully assess your condition. If your provider identifies any concerns that need to be evaluated in person, we will make arrangements to do so.  Finally, though the technology is pretty good, we cannot assure that it will always work on either your or our end, and in the setting of a video visit, we may have to convert it to a phone-only visit.  In either situation, we cannot ensure that we have a secure connection.  Are you willing to proceed?" STAFF: Did the patient verbally acknowledge consent to telehealth visit? Document  YES/NO here: Yes  4. Advise patient to be prepared - "Two hours prior to your appointment, go ahead and check your blood pressure, pulse, oxygen saturation, and your weight (if you have the equipment to check those) and write them all down. When your visit starts, your provider will ask you for this information. If you have an Apple Watch or Kardia device, please plan to have heart rate information ready on the day of your appointment. Please have a pen and paper handy nearby the day of the visit as well."  5. Give patient instructions for MyChart download to smartphone OR [email or web address] as below if video visit (depending on what platform provider is using)  6. Inform patient they will receive a phone call 15 minutes prior to their appointment time (may be from unknown caller ID) so they should be prepared to answer    TELEPHONE CALL NOTE  Travis Murphy has been deemed a candidate for a follow-up tele-health visit to limit community exposure during the Covid-19 pandemic. I spoke with the patient via phone to ensure availability of phone/video source, confirm preferred email & phone number, and discuss instructions and expectations.  I reminded Travis Murphy to be prepared with any vital sign and/or heart rhythm information that could potentially be obtained via home monitoring, at the time of his visit. I reminded Travis Murphy to expect a phone call prior to  his visit.  Terry L Goins 05/11/2019 8:24 AM

## 2019-05-13 ENCOUNTER — Ambulatory Visit: Payer: Medicare Other | Admitting: Cardiology

## 2019-05-20 NOTE — Progress Notes (Signed)
Virtual Visit via Telephone Note   This visit type was conducted due to national recommendations for restrictions regarding the COVID-19 Pandemic (e.g. social distancing) in an effort to limit this patient's exposure and mitigate transmission in our community.  Due to his co-morbid illnesses, this patient is at least at moderate risk for complications without adequate follow up.  This format is felt to be most appropriate for this patient at this time.  The patient did not have access to video technology/had technical difficulties with video requiring transitioning to audio format only (telephone).  All issues noted in this document were discussed and addressed.  No physical exam could be performed with this format.  Please refer to the patient's chart for his  consent to telehealth for [facility].   Date:  05/21/2019   ID:  Travis Murphy, DOB 07-13-1935, MRN [id number]  Patient Location: Home Provider Location: Office  PCP:  Asencion Noble, MD  Cardiologist:  Rozann Lesches, MD Electrophysiologist:  None   Evaluation Performed:  Follow-Up Visit  Chief Complaint:   Cardiac follow-up  History of Present Illness:    Travis Murphy is an 84 y.o. male last seen in December 2019.  We spoke by phone today.  He tells me that he has been doing well overall, reports NYHA class I-II dyspnea with typical activities, no exertional chest pain, palpitations, or syncope.  He has had no fevers or chills.  Enjoys walking, does play 9 holes of golf occasionally.  He is status post successful TAVR with a98mm Edwards Sapien 3 THV via the TF approach on 04/21/18. Recent follow-up echocardiogram showed LVEF 55 to 123456 with diastolic dysfunction, normal RV contraction, stable TAVR prosthesis with mean gradient 12 mmHg and mild perivalvular leak posteriorly.  I reviewed the results with him today.  We went over his medications, no longer on Plavix.  He had a chest CT done in December 2019 which did  not identify previously noted right lower lobe lesion, although possible inflammatory process described.  He does not report any cough.  He also has an ascending aortic aneurysm measuring 4.4 cm.  We discussed arranging a follow-up chest CTA in December.  He has not had repeat imaging per Dr. Willey Blade.  The patient does not have symptoms concerning for COVID-19 infection (fever, chills, cough, or new shortness of breath).    Past Medical History:  Diagnosis Date  . Arthritis   . Essential hypertension   . Hyperlipemia   . Hyperplastic colon polyp 2002   Dr. Arnoldo Morale  . Pulmonary nodule    a. right lower lobe. Seen on pre TAVR CT scan and will need follow up  . 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 Severe aortic stenosis    a. severe AS by echo in 2017 b. 01/2018: repeat echo showing EF of 55-60%, Grade 2 DD, and severe AS with mean gradient of 58 mm Hg  . Vertigo    Past Surgical History:  Procedure Laterality Date  . CATARACT EXTRACTION Right   . COLONOSCOPY  08/17/2001   Small polyp measured 3 mm removed/multiple middle scattered diverticula in the ascending colon-hyperplastic  . COLONOSCOPY  08/14/2011   Procedure: COLONOSCOPY;  Surgeon: Daneil Dolin, MD;  [facility];  Service: Endoscopy;  Laterality: N/A;  10:00  . EXCISIONAL HEMORRHOIDECTOMY    . INTRAOPERATIVE TRANSTHORACIC ECHOCARDIOGRAM N/A 04/21/2018  Procedure: INTRAOPERATIVE TRANSTHORACIC ECHOCARDIOGRAM;  Surgeon: Burnell Blanks, MD;  Location: [place];  Service: Open Heart Surgery;  Laterality: N/A;  . RIGHT/LEFT HEART CATH AND CORONARY ANGIOGRAPHY N/A 03/25/2018   Procedure: RIGHT/LEFT HEART CATH AND CORONARY ANGIOGRAPHY;  Surgeon: Burnell Blanks, MD;  Location: [place] [facility];  Service: Cardiovascular;  Laterality: N/A;  . TRANSCATHETER AORTIC VALVE REPLACEMENT,  TRANSFEMORAL  04/21/2018  . TRANSCATHETER AORTIC VALVE REPLACEMENT, TRANSFEMORAL N/A 04/21/2018   Procedure: TRANSCATHETER AORTIC VALVE REPLACEMENT, TRANSFEMORAL;  Surgeon: Burnell Blanks, MD;  Location: [place];  Service: Open Heart Surgery;  Laterality: N/A;     Current Meds  Medication Sig  . amoxicillin (AMOXIL) 500 MG tablet Take 2000 mg (4 tabs) 1 hour before any dental work or procedures  . aspirin EC 81 MG tablet Take 81 mg by mouth daily.    Marland Kitchen atorvastatin (LIPITOR) 10 MG tablet Take 10 mg by mouth daily.  . meclizine (ANTIVERT) 25 MG tablet Take 25 mg by mouth as directed.  . ramipril (ALTACE) 10 MG capsule Take 10 mg by mouth daily.   . sildenafil (REVATIO) 20 MG tablet TAKE 2 TO 5 TABLETS AS NEEDED 1 HOUR PRIOR TO INTERCOURSE (PRIOR AUTH DENIED)  . [DISCONTINUED] clopidogrel (PLAVIX) 75 MG tablet Take 1 tablet (75 mg total) by mouth daily with breakfast.     Allergies:   Patient has no known allergies.   Social History   Tobacco Use  . Smoking status: Former Smoker    Packs/day: 1.00    Years: 65.00    Pack years: 65.00    Types: Cigarettes    Quit date: 05/20/2018    Years since quitting: 1.0  . Smokeless tobacco: Never Used  Substance Use Topics  . Alcohol use: Not Currently    Alcohol/week: 0.0 standard drinks  . Drug use: Never     Family Hx: The patient's family history includes Heart attack in his father; Liver disease in his sister; Renal Disease in his brother; Stroke in his mother.  ROS:   Please see the history of present illness.    Hearing loss. All other systems reviewed and are negative.   Prior CV studies:   The following studies were reviewed today:  Echocardiogram 05/05/2019:  1. The left ventricle has normal systolic function, with an ejection fraction of 55-60%. The cavity size was normal. There is moderately increased left ventricular wall thickness. Left ventricular diastolic Doppler parameters are consistent with  impaired  relaxation. Elevated mean left atrial pressure.  2. The right ventricle has normal systolic function. The cavity was normal. There is no increase in right ventricular wall thickness.  3. No evidence of mitral valve stenosis.  4. Aortic valve regurgitation is mild by color flow Doppler. No stenosis of the aortic valve.  5. There is an Edwards Sapien 3 THV size 26 mm in the AV position. There is a mild perivalvular leak at the posterior attachment of the valve. No significant stenosis, mean gradient across valve 12 mmHg is within reported normals for this valve.  6. The aorta is normal unless otherwise noted.  7. The aortic root is normal in size and structure.  8. Pulmonary hypertension is indeterminant, inadequate TR jet.  9. The interatrial septum was not well visualized.  Labs/Other Tests and Data Reviewed:    EKG:  An ECG dated 04/29/2018 was personally reviewed today and demonstrated:  Sinus rhythm with decreased R wave progression.  Recent Labs:  04/17/2018: ALT 21; AST 20;  B Natriuretic Peptide 352.3 04/22/2018: BUN 10; Creatinine, Ser 0.75; Hemoglobin 13.6; Magnesium 2.1; Platelets 115; Potassium 3.6; Sodium 140   Wt Readings from Last 3 Encounters:  05/21/19 145 lb (65.8 kg)  08/11/18 143 lb (64.9 kg)  05/21/18 148 lb (67.1 kg)     Objective:    Vital Signs:  BP 123/68   Pulse 72   Ht 5\' 6"  (1.676 m)   Wt 145 lb (65.8 kg)   BMI 23.40 kg/m    Patient spoke in full sentences, not short of breath. No audible wheezing or coughing. Speech pattern normal.  ASSESSMENT & PLAN:    1.  Severe aortic stenosis status post TAVR in August 2019.  Recent follow-up echocardiogram reviewed above with normal function, mean gradient 12 mmHg, and a mild posterior perivalvular leak.  He is now off Plavix.  No fevers or chills.  He understands the importance of SBE prophylaxis.  He reports NYHA class I-II dyspnea depending on level of activity.  2.  Ascending aortic aneurysm, 4.4 cm by chest  CT in December 2019.  Patient also had right lower lobe inflammatory changes at that point and has not had follow-up imaging by Dr. Willey Blade as yet.  We will arrange a follow-up chest CTA in December of this year.  He reports no cough or hemoptysis.  3.  Essential hypertension, blood pressure is well controlled today.  He continues on Altace.  4.  Mixed hyperlipidemia, on Lipitor.  Requesting interval lab work from Dr. Willey Blade.  5.  Asymptomatic 3.1 cm infrarenal AAA, no follow-up imaging indicated at this time.  COVID-19 Education: The signs and symptoms of COVID-19 were discussed with the patient and how to seek care for testing (follow up with PCP or arrange E-visit).  The importance of social distancing was discussed today.  Time:   Today, I have spent 8 minutes with the patient with telehealth technology discussing the above problems.     Medication Adjustments/Labs and Tests Ordered: Current medicines are reviewed at length with the patient today.  Concerns regarding medicines are outlined above.   Tests Ordered: Orders Placed This Encounter  Procedures  . CT ANGIO CHEST AORTA W &/OR WO CONTRAST  . Basic Metabolic Panel (BMET)    Medication Changes: No orders of the defined types were placed in this encounter.   Follow Up:  In Person 6 months in the [place] office.  Signed, Rozann Lesches, MD  05/21/2019 2:07 PM    [place]

## 2019-05-21 ENCOUNTER — Encounter: Payer: Self-pay | Admitting: Cardiology

## 2019-05-21 ENCOUNTER — Telehealth (INDEPENDENT_AMBULATORY_CARE_PROVIDER_SITE_OTHER): Payer: Medicare Other | Admitting: Cardiology

## 2019-05-21 VITALS — BP 123/68 | HR 72 | Ht 66.0 in | Wt 145.0 lb

## 2019-05-21 DIAGNOSIS — I714 Abdominal aortic aneurysm, without rupture, unspecified: Secondary | ICD-10-CM

## 2019-05-21 DIAGNOSIS — I10 Essential (primary) hypertension: Secondary | ICD-10-CM | POA: Diagnosis not present

## 2019-05-21 DIAGNOSIS — Z952 Presence of prosthetic heart valve: Secondary | ICD-10-CM | POA: Diagnosis not present

## 2019-05-21 DIAGNOSIS — R911 Solitary pulmonary nodule: Secondary | ICD-10-CM | POA: Diagnosis not present

## 2019-05-21 DIAGNOSIS — E782 Mixed hyperlipidemia: Secondary | ICD-10-CM

## 2019-05-21 DIAGNOSIS — Z01818 Encounter for other preprocedural examination: Secondary | ICD-10-CM

## 2019-05-21 NOTE — Patient Instructions (Signed)
Medication Instructions: Your physician recommends that you continue on your current medications as directed. Please refer to the Current Medication list given to you today.   Labwork: Get BMET 1 week before Chest CT in December  Procedures/Testing: Chest Ct due in December  Follow-Up: In 6 months with Dr.McDowell after your chest ct is done  Any Additional Special Instructions Will Be Listed Below (If Applicable).     If you need a refill on your cardiac medications before your next appointment, please call your pharmacy.     Thank you for choosing [facility] !

## 2019-06-14 DIAGNOSIS — E1129 Type 2 diabetes mellitus with other diabetic kidney complication: Secondary | ICD-10-CM | POA: Diagnosis not present

## 2019-06-21 DIAGNOSIS — I10 Essential (primary) hypertension: Secondary | ICD-10-CM | POA: Diagnosis not present

## 2019-06-21 DIAGNOSIS — E1129 Type 2 diabetes mellitus with other diabetic kidney complication: Secondary | ICD-10-CM | POA: Diagnosis not present

## 2019-07-27 ENCOUNTER — Ambulatory Visit (HOSPITAL_COMMUNITY)
Admission: RE | Admit: 2019-07-27 | Discharge: 2019-07-27 | Disposition: A | Discharge status: Home / Self Care | Payer: Medicare Other | Source: Ambulatory Visit | Attending: Cardiology | Admitting: Cardiology

## 2019-07-27 ENCOUNTER — Other Ambulatory Visit: Payer: Self-pay

## 2019-07-27 DIAGNOSIS — I714 Abdominal aortic aneurysm, without rupture, unspecified: Secondary | ICD-10-CM

## 2019-07-27 DIAGNOSIS — I712 Thoracic aortic aneurysm, without rupture: Secondary | ICD-10-CM | POA: Diagnosis not present

## 2019-07-27 LAB — POCT I-STAT CREATININE: Creatinine, Ser: 0.9 mg/dL (ref 0.61–1.24)

## 2019-07-27 MED ORDER — IOHEXOL 350 MG/ML SOLN
100.0000 mL | Freq: Once | INTRAVENOUS | Status: AC | PRN
  Administered 2019-07-27: 09:00:00 100 mL via INTRAVENOUS

## 2019-09-14 DIAGNOSIS — E1129 Type 2 diabetes mellitus with other diabetic kidney complication: Secondary | ICD-10-CM | POA: Diagnosis not present

## 2019-09-23 DIAGNOSIS — E1129 Type 2 diabetes mellitus with other diabetic kidney complication: Secondary | ICD-10-CM | POA: Diagnosis not present

## 2019-09-23 DIAGNOSIS — I10 Essential (primary) hypertension: Secondary | ICD-10-CM | POA: Diagnosis not present

## 2019-10-16 ENCOUNTER — Ambulatory Visit: Payer: Medicare Other | Attending: Internal Medicine

## 2019-10-16 DIAGNOSIS — Z23 Encounter for immunization: Secondary | ICD-10-CM | POA: Insufficient documentation

## 2019-10-16 NOTE — Progress Notes (Signed)
   Covid-19 Vaccination Clinic  Name:  Travis Murphy    MRN: [id number] DOB: June 17, 1935  10/16/2019  Mr. Travis Murphy was observed post Covid-19 immunization for 15 minutes without incidence. He was provided with Vaccine Information Sheet and instruction to access the V-Safe system.   Mr. Travis Murphy was instructed to call 911 with any severe reactions post vaccine: Marland Kitchen Difficulty breathing  . Swelling of your face and throat  . A fast heartbeat  . A bad rash all over your body  . Dizziness and weakness    Immunizations Administered    Name Date Dose VIS Date Route   Pfizer COVID-19 Vaccine 10/16/2019 12:03 PM 0.3 mL 08/06/2019 Intramuscular   Manufacturer: [place]   Lot: [id number]   [place]: [id number]

## 2019-11-09 ENCOUNTER — Ambulatory Visit: Payer: Medicare Other | Attending: Internal Medicine

## 2019-11-09 DIAGNOSIS — Z23 Encounter for immunization: Secondary | ICD-10-CM

## 2019-11-09 NOTE — Progress Notes (Signed)
   Covid-19 Vaccination Clinic  Name:  Travis Murphy    MRN: [id number] DOB: [birth year]  11/09/2019  [name] was observed post Covid-19 immunization for 15 minutes without incident. He was provided with Vaccine Information Sheet and instruction to access the V-Safe system.   [name] was instructed to call 911 with any severe reactions post vaccine: Marland Kitchen Difficulty breathing  . Swelling of face and throat  . A fast heartbeat  . A bad rash all over body  . Dizziness and weakness   Immunizations Administered    Name Date Dose VIS Date Route   Pfizer COVID-19 Vaccine 11/09/2019 11:09 AM 0.3 mL 08/06/2019 Intramuscular   Manufacturer: [place]   Lot: [id number]   [place]: [id number]

## 2019-12-03 ENCOUNTER — Ambulatory Visit: Payer: Medicare Other | Admitting: Cardiology

## 2019-12-03 ENCOUNTER — Other Ambulatory Visit: Payer: Self-pay

## 2019-12-03 ENCOUNTER — Encounter: Payer: Self-pay | Admitting: Cardiology

## 2019-12-03 VITALS — BP 124/60 | HR 64 | Temp 96.3°F | Ht 66.0 in | Wt 145.0 lb

## 2019-12-03 DIAGNOSIS — I712 Thoracic aortic aneurysm, without rupture, unspecified: Secondary | ICD-10-CM

## 2019-12-03 DIAGNOSIS — Z952 Presence of prosthetic heart valve: Secondary | ICD-10-CM | POA: Diagnosis not present

## 2019-12-03 DIAGNOSIS — E782 Mixed hyperlipidemia: Secondary | ICD-10-CM | POA: Diagnosis not present

## 2019-12-03 NOTE — Patient Instructions (Signed)

## 2019-12-03 NOTE — Progress Notes (Signed)
Cardiology Office Note  Date: 12/03/2019   ID: Travis Murphy, Travis Murphy 1935/03/17, MRN [id number]  PCP:  Asencion Noble, MD  Cardiologist:  Rozann Lesches, MD Electrophysiologist:  None   Chief Complaint  Patient presents with  . Cardiac follow-up    History of Present Illness: Travis Murphy is an 85 y.o. male last assessed via telehealth encounter in September 2020.  He presents for a follow-up visit.  States that he has been doing well, just finished cutting his yard today.  He reports NYHA class II dyspnea, no angina symptoms or palpitations.  Follow-up chest CT from December 2020 is outlined below, ascending thoracic aorta measuring 4.2 cm and stable.  I reviewed this with him.  We went over his medications, list as outlined below.  His cardiac regimen includes aspirin, Lipitor, and Altace at this point.  Blood pressure well controlled today.  He will not be due for follow-up echocardiogram until later this year.  I personally reviewed his ECG today which shows sinus rhythm with PAC.  Past Medical History:  Diagnosis Date  . Arthritis   . Essential hypertension   . Hyperlipemia   . Hyperplastic colon polyp 2002   Dr. Arnoldo Morale  . Pulmonary nodule    a. right lower lobe. Seen on pre TAVR CT scan and will need follow up  . 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 Severe aortic stenosis    a. severe AS by echo in 2017 b. 01/2018: repeat echo showing EF of 55-60%, Grade 2 DD, and severe AS with mean gradient of 58 mm Hg  . Vertigo     Past Surgical History:  Procedure Laterality Date  . CATARACT EXTRACTION Right   . COLONOSCOPY  08/17/2001   Small polyp measured 3 mm removed/multiple middle scattered diverticula in the ascending colon-hyperplastic  . COLONOSCOPY  08/14/2011   Procedure: COLONOSCOPY;  Surgeon: Daneil Dolin, MD;   [facility];  Service: Endoscopy;  Laterality: N/A;  10:00  . EXCISIONAL HEMORRHOIDECTOMY    . INTRAOPERATIVE TRANSTHORACIC ECHOCARDIOGRAM N/A 04/21/2018   Procedure: INTRAOPERATIVE TRANSTHORACIC ECHOCARDIOGRAM;  Surgeon: Burnell Blanks, MD;  Location: [facility];  Service: Open Heart Surgery;  Laterality: N/A;  . RIGHT/LEFT HEART CATH AND CORONARY ANGIOGRAPHY N/A 03/25/2018   Procedure: RIGHT/LEFT HEART CATH AND CORONARY ANGIOGRAPHY;  Surgeon: Burnell Blanks, MD;  Location: [facility];  Service: Cardiovascular;  Laterality: N/A;  . TRANSCATHETER AORTIC VALVE REPLACEMENT, TRANSFEMORAL  04/21/2018  . TRANSCATHETER AORTIC VALVE REPLACEMENT, TRANSFEMORAL N/A 04/21/2018   Procedure: TRANSCATHETER AORTIC VALVE REPLACEMENT, TRANSFEMORAL;  Surgeon: Burnell Blanks, MD;  Location: [facility];  Service: Open Heart Surgery;  Laterality: N/A;    Current Outpatient Medications  Medication Sig Dispense Refill  . amoxicillin (AMOXIL) 500 MG tablet Take 2000 mg (4 tabs) 1 hour before any dental work or procedures 4 tablet 4  . aspirin EC 81 MG tablet Take 81 mg by mouth daily.      Marland Kitchen atorvastatin (LIPITOR) 10 MG tablet Take 10 mg by mouth daily.  4  . meclizine (ANTIVERT) 25 MG tablet Take 25 mg by mouth as directed.    . ramipril (ALTACE) 10 MG capsule Take 10 mg by mouth daily.     . sildenafil (REVATIO) 20 MG tablet TAKE 2 TO 5 TABLETS AS NEEDED 1 HOUR PRIOR TO INTERCOURSE (PRIOR AUTH DENIED)  No current facility-administered medications for this visit.   Allergies:  Patient has no allergy information on record.   ROS:   No palpitations or syncope.  Physical Exam: VS:  BP 124/60   Pulse 64   Temp (!) 96.3 F (35.7 C)   Ht 5\' 6"  (1.676 m)   Wt 145 lb (65.8 kg)   SpO2 94%   BMI 23.40 kg/m , BMI Body mass index is 23.4 kg/m.  Wt Readings from Last 3 Encounters:  12/03/19 145 lb (65.8 kg)  05/21/19 145 lb (65.8 kg)  08/11/18 143 lb (64.9 kg)    General:  Elderly male, appears comfortable at rest. HEENT: Conjunctiva and lids normal, wearing a mask. Neck: Supple, no elevated JVP or carotid bruits, no thyromegaly. Lungs: Clear to auscultation, nonlabored breathing at rest. Cardiac: Regular rate and rhythm, no S3, 2/6 systolic murmur, no pericardial rub. Abdomen: Soft, bowel sounds present. Extremities: No pitting edema, distal pulses 2+.  ECG:  An ECG dated 04/29/2018 was personally reviewed today and demonstrated:  Sinus rhythm with decreased R wave progression.  Recent Labwork: 07/27/2019: Creatinine, Ser 0.90   Other Studies Reviewed Today:  Echocardiogram 05/05/2019: 1. The left ventricle has normal systolic function, with an ejection fraction of 55-60%. The cavity size was normal. There is moderately increased left ventricular wall thickness. Left ventricular diastolic Doppler parameters are consistent with  impaired relaxation. Elevated mean left atrial pressure. 2. The right ventricle has normal systolic function. The cavity was normal. There is no increase in right ventricular wall thickness. 3. No evidence of mitral valve stenosis. 4. Aortic valve regurgitation is mild by color flow Doppler. No stenosis of the aortic valve. 5. There is an Edwards Sapien 3 THV size 26 mm in the AV position. There is a mild perivalvular leak at the posterior attachment of the valve. No significant stenosis, mean gradient across valve 12 mmHg is within reported normals for this valve. 6. The aorta is normal unless otherwise noted. 7. The aortic root is normal in size and structure. 8. Pulmonary hypertension is indeterminant, inadequate TR jet. 9. The interatrial septum was not well visualized.  Chest CT 07/27/2019: IMPRESSION: 1. Stable mild aneurysmal dilatation of the ascending thoracic aorta, measuring 4.2 cm diameter on today's study. No aortic dissection. Recommend annual imaging followup by CTA or MRA. This recommendation follows  2010 ACCF/AHA/AATS/ACR/ASA/SCA/SCAI/SIR/STS/SVM Guidelines for the Diagnosis and Management of Patients with Thoracic Aortic Disease. Circulation. 2010; 121ML:4928372. Aortic aneurysm NOS (ICD10-I71.9) 2. Aortic valve hardware in place. 3. Chronic bronchitic changes, most prominent within the RIGHT lower lobe. No evidence of pneumonia or pulmonary edema. w 4. No acute findings.  Aortic Atherosclerosis (ICD10-I70.0) and Emphysema (ICD10-J43.9).  Assessment and Plan:  1.  Aortic stenosis status post TAVR in August 2019.  He is doing well at this point.  Plan follow-up echocardiogram for later this year.  He did have mild aortic regurgitation/perivalvular leak by his last study in September 2020, mean gradient 12 mmHg.  2.  Ascending thoracic aortic aneurysm, 4.2 cm by follow-up chest CT in December 2020.  He is asymptomatic.  3.  Mixed hyperlipidemia on Lipitor.  He continues to follow with Dr. Willey Blade.  Medication Adjustments/Labs and Tests Ordered: Current medicines are reviewed at length with the patient today.  Concerns regarding medicines are outlined above.   Tests Ordered: Orders Placed This Encounter  Procedures  . EKG 12-Lead  . ECHOCARDIOGRAM COMPLETE    Medication Changes: No orders of the defined types were placed  in this encounter.   Disposition:  Follow up 6 months in the [place] office.  Signed, Satira Sark, MD, [facility] 12/03/2019 4:13 PM    [facility] [address], [place], [place] [address] Phone: [phone]; Fax: [phone]

## 2019-12-14 ENCOUNTER — Other Ambulatory Visit: Payer: Self-pay

## 2019-12-14 ENCOUNTER — Ambulatory Visit (HOSPITAL_COMMUNITY)
Admission: RE | Admit: 2019-12-14 | Discharge: 2019-12-14 | Disposition: A | Discharge status: Home / Self Care | Payer: Medicare Other | Source: Ambulatory Visit | Attending: Cardiology | Admitting: Cardiology

## 2019-12-14 DIAGNOSIS — Z952 Presence of prosthetic heart valve: Secondary | ICD-10-CM | POA: Diagnosis not present

## 2019-12-14 NOTE — Progress Notes (Signed)
*  PRELIMINARY RESULTS* Echocardiogram 2D Echocardiogram has been performed.  Travis Murphy 12/14/2019, 11:30 AM

## 2020-02-14 DIAGNOSIS — I10 Essential (primary) hypertension: Secondary | ICD-10-CM | POA: Diagnosis not present

## 2020-02-14 DIAGNOSIS — E785 Hyperlipidemia, unspecified: Secondary | ICD-10-CM | POA: Diagnosis not present

## 2020-02-14 DIAGNOSIS — Z79899 Other long term (current) drug therapy: Secondary | ICD-10-CM | POA: Diagnosis not present

## 2020-02-14 DIAGNOSIS — E1129 Type 2 diabetes mellitus with other diabetic kidney complication: Secondary | ICD-10-CM | POA: Diagnosis not present

## 2020-02-21 DIAGNOSIS — I350 Nonrheumatic aortic (valve) stenosis: Secondary | ICD-10-CM | POA: Diagnosis not present

## 2020-02-21 DIAGNOSIS — I10 Essential (primary) hypertension: Secondary | ICD-10-CM | POA: Diagnosis not present

## 2020-02-21 DIAGNOSIS — R7309 Other abnormal glucose: Secondary | ICD-10-CM | POA: Diagnosis not present

## 2020-02-21 DIAGNOSIS — E1122 Type 2 diabetes mellitus with diabetic chronic kidney disease: Secondary | ICD-10-CM | POA: Diagnosis not present

## 2020-02-21 DIAGNOSIS — I712 Thoracic aortic aneurysm, without rupture: Secondary | ICD-10-CM | POA: Diagnosis not present

## 2020-02-21 DIAGNOSIS — Z0001 Encounter for general adult medical examination with abnormal findings: Secondary | ICD-10-CM | POA: Diagnosis not present

## 2020-06-13 ENCOUNTER — Encounter: Payer: Self-pay | Admitting: Cardiology

## 2020-06-13 ENCOUNTER — Ambulatory Visit: Payer: Medicare Other | Admitting: Cardiology

## 2020-06-13 VITALS — BP 112/68 | HR 62 | Ht 66.0 in | Wt 140.0 lb

## 2020-06-13 DIAGNOSIS — I712 Thoracic aortic aneurysm, without rupture, unspecified: Secondary | ICD-10-CM

## 2020-06-13 DIAGNOSIS — E782 Mixed hyperlipidemia: Secondary | ICD-10-CM

## 2020-06-13 DIAGNOSIS — Z952 Presence of prosthetic heart valve: Secondary | ICD-10-CM

## 2020-06-13 NOTE — Progress Notes (Signed)
Cardiology Office Note  Date: 06/13/2020   ID: Trentin, Knappenberger [birth year], MRN [id number]  PCP:  Asencion Noble, MD  Cardiologist:  Rozann Lesches, MD Electrophysiologist:  None   Chief Complaint  Patient presents with  . Cardiac follow-up    History of Present Illness: Travis Murphy is an 85 y.o. male last seen in April.  He presents for a routine visit.  States that he has been doing relatively well, playing golf when the weather allows.  He does not report any exertional chest pain or unusual breathlessness with typical activities.  I reviewed his interval lab work from June as noted below.  He had a follow-up echocardiogram in April as well.  He is due for follow-up chest CTA in December with history of ascending aortic aneurysm, 4.2 cm by last assessment.  He remains asymptomatic.  I reviewed his medications which are outlined below.  He continues to follow regularly with Dr. Willey Blade.  Past Medical History:  Diagnosis Date  . Arthritis   . Essential hypertension   . Hyperlipemia   . Hyperplastic colon polyp 2002   Dr. Arnoldo Morale  . Pulmonary nodule    a. right lower lobe. Seen on pre TAVR CT scan and will need follow up  . 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 Severe aortic stenosis    a. severe AS by echo in 2017 b. 01/2018: repeat echo showing EF of 55-60%, Grade 2 DD, and severe AS with mean gradient of 58 mm Hg  . Vertigo     Past Surgical History:  Procedure Laterality Date  . CATARACT EXTRACTION Right   . COLONOSCOPY  08/17/2001   Small polyp measured 3 mm removed/multiple middle scattered diverticula in the ascending colon-hyperplastic  . COLONOSCOPY  08/14/2011   Procedure: COLONOSCOPY;  Surgeon: Daneil Dolin, MD;  [facility];  Service: Endoscopy;  Laterality: N/A;  10:00  . EXCISIONAL  HEMORRHOIDECTOMY    . INTRAOPERATIVE TRANSTHORACIC ECHOCARDIOGRAM N/A 04/21/2018   Procedure: INTRAOPERATIVE TRANSTHORACIC ECHOCARDIOGRAM;  Surgeon: Burnell Blanks, MD;  Location: [facility];  Service: Open Heart Surgery;  Laterality: N/A;  . RIGHT/LEFT HEART CATH AND CORONARY ANGIOGRAPHY N/A 03/25/2018   Procedure: RIGHT/LEFT HEART CATH AND CORONARY ANGIOGRAPHY;  Surgeon: Burnell Blanks, MD;  Location: [facility];  Service: Cardiovascular;  Laterality: N/A;  . TRANSCATHETER AORTIC VALVE REPLACEMENT, TRANSFEMORAL  04/21/2018  . TRANSCATHETER AORTIC VALVE REPLACEMENT, TRANSFEMORAL N/A 04/21/2018   Procedure: TRANSCATHETER AORTIC VALVE REPLACEMENT, TRANSFEMORAL;  Surgeon: Burnell Blanks, MD;  Location: [facility];  Service: Open Heart Surgery;  Laterality: N/A;    Current Outpatient Medications  Medication Sig Dispense Refill  . amoxicillin (AMOXIL) 500 MG tablet Take 2000 mg (4 tabs) 1 hour before any dental work or procedures 4 tablet 4  . aspirin EC 81 MG tablet Take 81 mg by mouth daily.      Marland Kitchen atorvastatin (LIPITOR) 10 MG tablet Take 10 mg by mouth daily.  4  . meclizine (ANTIVERT) 25 MG tablet Take 25 mg by mouth as directed.    . ramipril (ALTACE) 10 MG capsule Take 10 mg by mouth daily.     . sildenafil (REVATIO) 20 MG tablet TAKE 2 TO 5 TABLETS AS NEEDED 1 HOUR PRIOR TO INTERCOURSE (PRIOR AUTH DENIED)     No current facility-administered medications for this visit.  Allergies:  Patient has no known allergies.   ROS: No palpitations or syncope.  Physical Exam: VS:  BP 112/68   Pulse 62   Ht 5\' 6"  (1.676 m)   Wt 140 lb (63.5 kg)   SpO2 98%   BMI 22.60 kg/m , BMI Body mass index is 22.6 kg/m.  Wt Readings from Last 3 Encounters:  06/13/20 140 lb (63.5 kg)  12/03/19 145 lb (65.8 kg)  05/21/19 145 lb (65.8 kg)    General: Elderly male, appears comfortable at rest. HEENT: Conjunctiva and lids normal, wearing a mask. Neck: Supple, no elevated JVP or  carotid bruits, no thyromegaly. Lungs: Clear to auscultation, nonlabored breathing at rest. Cardiac: Regular rate and rhythm, no S3, 2/6 systolic murmur, no pericardial rub. Extremities: No pitting edema, distal pulses 2+.  ECG:  An ECG dated 02/21/2020 was personally reviewed today and demonstrated:  Sinus rhythm with leftward axis, poor R wave progression rule out old anterior infarct pattern.  Recent Labwork:  June 2021: BUN 16, creatinine 0.95, potassium 4.3, AST 19, ALT 20, hemoglobin 14.8, platelets 164, cholesterol 119, triglycerides 70, HDL 38, LDL 66, hemoglobin A1c 6.2%  Other Studies Reviewed Today:  Echocardiogram 12/14/2019: 1. Left ventricular ejection fraction, by estimation, is 55 to 60%. The  left ventricle has normal function. The left ventricle has no regional  wall motion abnormalities. There is moderate concentric left ventricular  hypertrophy. Left ventricular  diastolic parameters are consistent with Grade I diastolic dysfunction  (impaired relaxation). Elevated left ventricular end-diastolic pressure.  2. Right ventricular systolic function is normal. The right ventricular  size is normal.  3. Left atrial size was mildly dilated.  4. The mitral valve is degenerative. Mild mitral valve regurgitation.  5. There is an Edwards Sapien 3 THV size 26 mm in the AV position. There  is mild perivalvular regurgitation. The aortic valve has been  repaired/replaced. Aortic valve regurgitation is mild. No aortic stenosis  is present. Aortic valve mean gradient  measures 9.7 mmHg. Aortic valve Vmax measures 2.19 m/s.  6. The inferior vena cava is normal in size with greater than 50%  respiratory variability, suggesting right atrial pressure of 3 mmHg.   Chest CT 07/27/2019: IMPRESSION: 1. Stable mild aneurysmal dilatation of the ascending thoracic aorta, measuring 4.2 cm diameter on today's study. No aortic dissection. Recommend annual imaging followup by CTA or MRA.  This recommendation follows 2010 ACCF/AHA/AATS/ACR/ASA/SCA/SCAI/SIR/STS/SVM Guidelines for the Diagnosis and Management of Patients with Thoracic Aortic Disease. Circulation. 2010; 121: I696-E952. Aortic aneurysm NOS (ICD10-I71.9) 2. Aortic valve hardware in place. 3. Chronic bronchitic changes, most prominent within the RIGHT lower lobe. No evidence of pneumonia or pulmonary edema. w 4. No acute findings.  Aortic Atherosclerosis (ICD10-I70.0) and Emphysema (ICD10-J43.9).  Assessment and Plan:  1.  Aortic stenosis status post TAVR in August 2019.  He is doing well at this time.  Mild paravalvular leak was stable by echocardiogram in April with normal mean gradient.  We will obtain a follow-up echocardiogram with clinical assessment in April of next year.  2.  Asymptomatic, mild ascending thoracic aortic aneurysm, 4.2 cm by CTA in December 2020.  Follow-up study will be obtained for this year.  3.  Mixed hyperlipidemia, he remains on Lipitor with follow-up by Dr. Willey Blade.  Last LDL 66.  Medication Adjustments/Labs and Tests Ordered: Current medicines are reviewed at length with the patient today.  Concerns regarding medicines are outlined above.   Tests Ordered: Orders Placed This Encounter  Procedures  .  CT ANGIO CHEST AORTA W/CM & OR WO/CM  . ECHOCARDIOGRAM COMPLETE    Medication Changes: No orders of the defined types were placed in this encounter.   Disposition:  Follow up April of next year.  Signed, Satira Sark, MD, [facility] 06/13/2020 4:06 PM    [facility] at [place], [place], [place] [address] Phone: [phone]; Fax: [phone]

## 2020-06-13 NOTE — Patient Instructions (Signed)
Your physician recommends that you schedule a follow-up appointment in: [place]  Your physician recommends that you continue on your current medications as directed. Please refer to the Current Medication list given to you today.  Your physician has requested that you have an echocardiogram April 2022. Echocardiography is a painless test that uses sound waves to create images of your heart. It provides your doctor with information about the size and shape of your heart and how well your heart's chambers and valves are working. This procedure takes approximately one hour. There are no restrictions for this procedure.  Non-Cardiac CT Angiography (CTA) December 2021, is a special type of CT scan that uses a computer to produce multi-dimensional views of major blood vessels throughout the body. In CT angiography, a contrast material is injected through an IV to help visualize the blood vessels  Thank you for choosing [facility]!!

## 2020-06-14 ENCOUNTER — Telehealth: Payer: Self-pay | Admitting: Cardiology

## 2020-06-14 NOTE — Telephone Encounter (Signed)
Pre-cert Verification for the following procedure    CT ANGIO CHEST   DATE: 08/13/2020  LOCATION: [facility]

## 2020-06-15 DIAGNOSIS — E1129 Type 2 diabetes mellitus with other diabetic kidney complication: Secondary | ICD-10-CM | POA: Diagnosis not present

## 2020-06-22 DIAGNOSIS — I350 Nonrheumatic aortic (valve) stenosis: Secondary | ICD-10-CM | POA: Diagnosis not present

## 2020-06-22 DIAGNOSIS — R7309 Other abnormal glucose: Secondary | ICD-10-CM | POA: Diagnosis not present

## 2020-06-22 DIAGNOSIS — I10 Essential (primary) hypertension: Secondary | ICD-10-CM | POA: Diagnosis not present

## 2020-06-22 DIAGNOSIS — E1122 Type 2 diabetes mellitus with diabetic chronic kidney disease: Secondary | ICD-10-CM | POA: Diagnosis not present

## 2020-08-07 ENCOUNTER — Other Ambulatory Visit: Payer: Self-pay

## 2020-08-07 ENCOUNTER — Telehealth: Payer: Self-pay | Admitting: *Deleted

## 2020-08-07 ENCOUNTER — Ambulatory Visit (HOSPITAL_COMMUNITY)
Admission: RE | Admit: 2020-08-07 | Discharge: 2020-08-07 | Disposition: A | Discharge status: Home / Self Care | Payer: Medicare Other | Source: Ambulatory Visit | Attending: Cardiology | Admitting: Cardiology

## 2020-08-07 DIAGNOSIS — I712 Thoracic aortic aneurysm, without rupture, unspecified: Secondary | ICD-10-CM

## 2020-08-07 DIAGNOSIS — I517 Cardiomegaly: Secondary | ICD-10-CM | POA: Diagnosis not present

## 2020-08-07 DIAGNOSIS — R911 Solitary pulmonary nodule: Secondary | ICD-10-CM | POA: Diagnosis not present

## 2020-08-07 LAB — POCT I-STAT CREATININE: Creatinine, Ser: 0.9 mg/dL (ref 0.61–1.24)

## 2020-08-07 MED ORDER — IOHEXOL 350 MG/ML SOLN
100.0000 mL | Freq: Once | INTRAVENOUS | Status: AC | PRN
  Administered 2020-08-07: 100 mL via INTRAVENOUS

## 2020-08-07 NOTE — Telephone Encounter (Signed)
Patient' daughter Helene Kelp informed. Copy sent to PCP

## 2020-08-07 NOTE — Telephone Encounter (Signed)
-----   Message from Satira Sark, MD sent at 08/07/2020 10:59 AM EST ----- Results reviewed.  Follow-up chest CTA shows stable dilatation of the ascending thoracic aorta at 42 mm.  Continue with current medications and follow-up plan.

## 2020-10-16 DIAGNOSIS — E1129 Type 2 diabetes mellitus with other diabetic kidney complication: Secondary | ICD-10-CM | POA: Diagnosis not present

## 2020-10-23 DIAGNOSIS — R7309 Other abnormal glucose: Secondary | ICD-10-CM | POA: Diagnosis not present

## 2020-10-23 DIAGNOSIS — E1122 Type 2 diabetes mellitus with diabetic chronic kidney disease: Secondary | ICD-10-CM | POA: Diagnosis not present

## 2020-10-23 DIAGNOSIS — I700 Atherosclerosis of aorta: Secondary | ICD-10-CM | POA: Diagnosis not present

## 2020-11-23 ENCOUNTER — Other Ambulatory Visit: Payer: Self-pay | Admitting: Cardiology

## 2020-11-23 DIAGNOSIS — I10 Essential (primary) hypertension: Secondary | ICD-10-CM | POA: Diagnosis not present

## 2020-11-23 DIAGNOSIS — I359 Nonrheumatic aortic valve disorder, unspecified: Secondary | ICD-10-CM

## 2020-11-23 DIAGNOSIS — E1129 Type 2 diabetes mellitus with other diabetic kidney complication: Secondary | ICD-10-CM | POA: Diagnosis not present

## 2020-11-23 DIAGNOSIS — E785 Hyperlipidemia, unspecified: Secondary | ICD-10-CM | POA: Diagnosis not present

## 2020-12-13 ENCOUNTER — Ambulatory Visit (INDEPENDENT_AMBULATORY_CARE_PROVIDER_SITE_OTHER): Payer: Medicare Other

## 2020-12-13 DIAGNOSIS — I359 Nonrheumatic aortic valve disorder, unspecified: Secondary | ICD-10-CM | POA: Diagnosis not present

## 2020-12-13 LAB — ECHOCARDIOGRAM COMPLETE
AR max vel: 1.4 cm2
AV Area VTI: 1.58 cm2
AV Area mean vel: 1.51 cm2
AV Mean grad: 13.8 mmHg
AV Peak grad: 26.2 mmHg
AV Vena cont: 0.41 cm
Ao pk vel: 2.56 m/s
Area-P 1/2: 2.14 cm2
Calc EF: 61.4 %
MV M vel: 5.36 m/s
MV Peak grad: 114.8 mmHg
P 1/2 time: 544 msec
S' Lateral: 2.76 cm
Single Plane A2C EF: 63.3 %
Single Plane A4C EF: 60.6 %

## 2020-12-15 ENCOUNTER — Telehealth: Payer: Self-pay | Admitting: *Deleted

## 2020-12-15 NOTE — Telephone Encounter (Signed)
-----   Message from Satira Sark, MD sent at 12/13/2020  7:25 PM EDT ----- Results reviewed.  LVEF remains normal at 60 to 65%.  TAVR prosthesis shows mild to moderate paravalvular regurgitation with mean gradient 13.8 mmHg.  Would keep scheduled follow-up for further review.

## 2020-12-15 NOTE — Telephone Encounter (Signed)
Patient's daughter Larey Seat informed. Copy sent to PCP

## 2020-12-26 ENCOUNTER — Ambulatory Visit: Payer: Medicare Other | Admitting: Student

## 2020-12-26 ENCOUNTER — Other Ambulatory Visit: Payer: Self-pay

## 2020-12-26 ENCOUNTER — Encounter: Payer: Self-pay | Admitting: Student

## 2020-12-26 VITALS — BP 128/72 | HR 62 | Ht 66.0 in | Wt 135.0 lb

## 2020-12-26 DIAGNOSIS — Z952 Presence of prosthetic heart valve: Secondary | ICD-10-CM

## 2020-12-26 DIAGNOSIS — I712 Thoracic aortic aneurysm, without rupture, unspecified: Secondary | ICD-10-CM

## 2020-12-26 DIAGNOSIS — I10 Essential (primary) hypertension: Secondary | ICD-10-CM | POA: Diagnosis not present

## 2020-12-26 DIAGNOSIS — I2510 Atherosclerotic heart disease of native coronary artery without angina pectoris: Secondary | ICD-10-CM

## 2020-12-26 NOTE — Patient Instructions (Signed)

## 2020-12-26 NOTE — Progress Notes (Signed)
Cardiology Office Note    Date:  12/26/2020   ID:  Travis, Murphy April 27, 1935, MRN [id number]  PCP:  Travis Noble, MD  Cardiologist: Travis Lesches, MD    Chief Complaint  Patient presents with  . Follow-up    6 month visit    History of Present Illness:    Travis Murphy is a 86 y.o. male with past medical history of severe AS (s/p TAVR in 03/2018), CAD (mild nonobstructive disease by cath in 02/2018), HTN, HLD, ascending thoracic aortic aneurysm (at 42 mm by CT in 07/2020) and prior tobacco use who presents to the office today for 50-month follow-up.   He was last examined by Dr. Domenic Murphy in 05/2020 and he reported overall doing well and denied any anginal symptoms. He was continued on his current medication regimen and a repeat CTA was recommended for follow-up of his thoracic aortic aneurysm. Was also recommended to have a follow-up echocardiogram in 11/2020.  His CTA showed his aneurysm remained stable at 42 mm with annual imaging recommended. A follow-up echocardiogram was obtained in 11/2020 and showed a preserved EF of 60 to 65% with no regional wall motion abnormalities. He did have mild to moderate perivalvular regurgitation (mild in 11/2019) and no aortic stenosis.  In talking with the patient today, he reports overall doing well from a cardiac perspective since his last visit. 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 Denies any recent orthopnea, PND or pitting edema. No recent chest pain or dyspnea on exertion. He does experience occasional dizziness if getting out of bed too quickly in the morning but overall stable and no changes. No associated presyncope.    Past Medical History:  Diagnosis Date  . Arthritis   . Essential hypertension   . Hyperlipemia   . Hyperplastic colon polyp 2002   Dr. Arnoldo Murphy  . Pulmonary nodule    a. right lower  lobe. Seen on pre TAVR CT scan and will need follow up  . 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 Severe aortic stenosis    a. severe AS by echo in 2017 b. 01/2018: repeat echo showing EF of 55-60%, Grade 2 DD, and severe AS with mean gradient of 58 mm Hg  . Vertigo     Past Surgical History:  Procedure Laterality Date  . CATARACT EXTRACTION Right   . COLONOSCOPY  08/17/2001   Small polyp measured 3 mm removed/multiple middle scattered diverticula in the ascending colon-hyperplastic  . COLONOSCOPY  08/14/2011   Procedure: COLONOSCOPY;  Surgeon: Travis Dolin, MD;  [facility];  Service: Endoscopy;  Laterality: N/A;  10:00  . EXCISIONAL HEMORRHOIDECTOMY    . INTRAOPERATIVE TRANSTHORACIC ECHOCARDIOGRAM N/A 04/21/2018   Procedure: INTRAOPERATIVE TRANSTHORACIC ECHOCARDIOGRAM;  Surgeon: Travis Blanks, MD;  Location: [facility];  Service: Open Heart Surgery;  Laterality: N/A;  . RIGHT/LEFT HEART CATH AND CORONARY ANGIOGRAPHY N/A 03/25/2018   Procedure: RIGHT/LEFT HEART CATH AND CORONARY ANGIOGRAPHY;  Surgeon: Travis Blanks, MD;  Location: [facility];  Service: Cardiovascular;  Laterality: N/A;  . TRANSCATHETER AORTIC VALVE REPLACEMENT, TRANSFEMORAL  04/21/2018  . TRANSCATHETER AORTIC VALVE REPLACEMENT, TRANSFEMORAL N/A 04/21/2018   Procedure: TRANSCATHETER AORTIC VALVE REPLACEMENT, TRANSFEMORAL;  Surgeon: Travis Blanks, MD;  Location:  [facility];  Service: Open Heart Surgery;  Laterality: N/A;    Current Medications: Outpatient Medications Prior to Visit  Medication Sig Dispense Refill  . amoxicillin (AMOXIL) 500 MG tablet Take 2000 mg (4 tabs) 1 hour before any dental work or procedures 4 tablet 4  . aspirin EC 81 MG tablet Take 81 mg by mouth daily.    Marland Kitchen atorvastatin (LIPITOR) 10 MG tablet Take 10 mg by mouth daily.  4  .  meclizine (ANTIVERT) 25 MG tablet Take 25 mg by mouth as directed.    . ramipril (ALTACE) 10 MG capsule Take 10 mg by mouth daily.    . sildenafil (REVATIO) 20 MG tablet TAKE 2 TO 5 TABLETS AS NEEDED 1 HOUR PRIOR TO INTERCOURSE (PRIOR AUTH DENIED)     No facility-administered medications prior to visit.     Allergies:   Patient has no known allergies.   Social History   Socioeconomic History  . Marital status: Widowed    Spouse name: Not on file  . Number of children: 2  . Years of education: Not on file  . Highest education level: Not on file  Occupational History  . Occupation: retired; [employer]  . Smoking status: Former Smoker    Packs/day: 1.00    Years: 65.00    Pack years: 65.00    Types: Cigarettes    Quit date: 05/20/2018    Years since quitting: 2.6  . Smokeless tobacco: Never Used  Vaping Use  . Vaping Use: Never used  Substance and Sexual Activity  . Alcohol use: Not Currently    Alcohol/week: 0.0 standard drinks  . Drug use: Never  . Sexual activity: Not Currently  Other Topics Concern  . Not on file  Social History Narrative   Widower (married for 54yrs); Remarried in 2016      Social Determinants of Health   Financial Resource Strain: Not on file  Food Insecurity: Not on file  Transportation Needs: Not on file  Physical Activity: Not on file  Stress: Not on file  Social Connections: Not on file     Family History:  The patient's family history includes Heart attack in his father; Liver disease in his sister; Renal Disease in his brother; Stroke in his mother.   Review of Systems:    Please see the history of present illness.     All other systems reviewed and are otherwise negative except as noted above.   Physical Exam:    VS:  BP 128/72   Pulse 62   Ht 5\' 6"  (1.676 m)   Wt 135 lb (61.2 kg)   SpO2 95%   BMI 21.79 kg/m    General: Well developed, well nourished,male appearing in no acute distress. Head: Normocephalic,  atraumatic. Neck: No carotid bruits. JVD not elevated.  Lungs: Respirations regular and unlabored, without wheezes or rales.  Heart: Regular rate and rhythm. No S3 or S4.  2/6 SEM along RUSB.  Abdomen: Appears non-distended. No obvious abdominal masses. Msk:  Strength and tone appear normal for age. No obvious joint deformities or effusions. Extremities: No clubbing or cyanosis. No edema.  Distal pedal pulses are 2+ bilaterally. Neuro: Alert and oriented X 3. Moves all extremities spontaneously. No focal deficits noted. Psych:  Responds to questions appropriately with a normal affect. Skin: No rashes or lesions noted  Wt Readings from Last 3 Encounters:  12/26/20 135 lb (61.2 kg)  06/13/20 140 lb (63.5 kg)  12/03/19 145 lb (  65.8 kg)     Studies/Labs Reviewed:   EKG:  EKG is ordered today.  The ekg ordered today demonstrates NSR, HR 69 with LAD. No acute ST changes when compared to prior tracings.   Recent Labs: 08/07/2020: Creatinine, Ser 0.90   Lipid Panel No results found for: CHOL, TRIG, HDL, CHOLHDL, VLDL, LDLCALC, LDLDIRECT  Additional studies/ records that were reviewed today include:   Cardiac Catheterization: 02/2018  Ost RCA lesion is 40% stenosed.  Prox RCA to Dist RCA lesion is 20% stenosed.  1st RPLB lesion is 20% stenosed.  Prox Cx to Mid Cx lesion is 30% stenosed.  3rd Mrg lesion is 30% stenosed.  Dist LAD lesion is 70% stenosed.  Ost LAD to Prox LAD lesion is 30% stenosed.  Dist LM to Ost LAD lesion is 20% stenosed.  LV end diastolic pressure is normal.   1. Mild non-obstructive CAD 2. Severe aortic valve stenosis (peak to peak gradient 43 mmHg, mean gradient 38 mmHg, AVA 0.70cm2)  Continue planning for TAVR.   Echocardiogram: 11/2020 IMPRESSIONS    1. Left ventricular ejection fraction, by estimation, is 60 to 65%. The  left ventricle has normal function. The left ventricle has no regional  wall motion abnormalities. Left ventricular  diastolic parameters are  indeterminate. Elevated left atrial  pressure.  2. Right ventricular systolic function is normal. The right ventricular  size is normal.  3. Left atrial size was severely dilated.  4. The mitral valve is abnormal. Mild mitral valve regurgitation. Mild  mitral stenosis.  5. 26 mm Edwards Sapien 3 transcatheter heart valve is in the AV  position. Mild to moderate perivalvular regurgitation. . The aortic valve  has been repaired/replaced. Aortic valve regurgitation is mild to  moderate. No aortic stenosis is present.  6. The inferior vena cava is normal in size with greater than 50%  respiratory variability, suggesting right atrial pressure of 3 mmHg.   Comparison(s): Echocardiogram done 12/14/19 showed an EF of 55-60% with an  AV Peak Grad of 19 mmHg.   Assessment:    1. S/P TAVR (transcatheter aortic valve replacement)   2. Thoracic aortic aneurysm without rupture ([place])   3. Coronary artery disease involving native coronary artery of native heart without angina pectoris   4. Essential hypertension      Plan:   In order of problems listed above:  1. Severe AS - He is s/p TAVR in 03/2018 and recent echocardiogram showed mild to moderate perivalvular regurgitation and was previously mild. He remains active at baseline and denies any recent symptoms. Reviewed echo results with the patient and would anticipate obtaining a repeat echo next year unless any new symptoms indicate evaluation in the interim.   2. Ascending Thoracic Aortic Aneurysm - Stable at 42 mm by CT in 07/2020 with annual imaging recommended. At the time of his next visit, would order repeat CTA to be performed in 07/2021.  3. CAD - He had mild nonobstructive disease by cath in 02/2018. He remains active at baseline for his age and denies any recent anginal symptoms.  - Continue ASA 81mg  daily, Atorvastatin 10mg  daily and Ramipril 10mg  daily.   4. HTN - BP was initially recorded at  144/76, rechecked and improved to 128/72. He remains on Ramipril 10mg  daily. He reports having labs with his PCP within the past 2 months and I will request a copy of these records.    Medication Adjustments/Labs and Tests Ordered: Current medicines are reviewed at length with the  patient today.  Concerns regarding medicines are outlined above.  Medication changes, Labs and Tests ordered today are listed in the Patient Instructions below. Patient Instructions  Medication Instructions:   Your physician recommends that you continue on your current medications as directed. Please refer to the Current Medication list given to you today.  *If you need a refill on your cardiac medications before your next appointment, please call your pharmacy*   Lab Work: None today  If you have labs (blood work) drawn today and your tests are completely normal, you will receive your results only by: Marland Kitchen MyChart Message (if you have MyChart) OR . A paper copy in the mail If you have any lab test that is abnormal or we need to change your treatment, we will call you to review the results.   Testing/Procedures: None today   Follow-Up: At [facility], you and your health needs are our priority.  As part of our continuing mission to provide you with exceptional heart care, we have created designated Provider Care Teams.  These Care Teams include your primary Cardiologist (physician) and Advanced Practice Providers (APPs -  Physician Assistants and Nurse Practitioners) who all work together to provide you with the care you need, when you need it.  We recommend signing up for the patient portal called "MyChart".  Sign up information is provided on this After Visit Summary.  MyChart is used to connect with patients for Virtual Visits (Telemedicine).  Patients are able to view lab/test results, encounter notes, upcoming appointments, etc.  Non-urgent messages can be sent to your provider as well.   To learn more  about what you can do with MyChart, go to [email or web address].    Your next appointment:   6 month(s)  The format for your next appointment:   In Person  Provider:   Rozann Lesches, MD   Other Instructions None      Signed, Erma Heritage, PA-C  12/26/2020 7:10 PM    [facility] [address] [place], [place] [address] Phone: [phone] Fax: [phone]

## 2020-12-28 ENCOUNTER — Telehealth: Payer: Self-pay | Admitting: Student

## 2020-12-28 DIAGNOSIS — I350 Nonrheumatic aortic (valve) stenosis: Secondary | ICD-10-CM

## 2020-12-28 DIAGNOSIS — Z952 Presence of prosthetic heart valve: Secondary | ICD-10-CM

## 2020-12-28 NOTE — Telephone Encounter (Addendum)
     Please let the patient know I reviewed his recent echocardiogram findings with Dr. Domenic Polite and he did recommend that we obtain a follow-up echocardiogram in 6 months instead of 1 year given that he had mild to moderate perivalvular regurgitation by his most recent echocardiogram.  I would recommend obtaining this prior to his follow-up visit. Association for his echocardiogram is aortic stenosis (s/p TAVR).    Thanks,  [place]

## 2020-12-29 NOTE — Telephone Encounter (Signed)
Spoke to pt daughter who voiced understanding. She had no questions or concerns at this time. Will forward request to scheduling pool to get echo scheduled before follow up visit

## 2020-12-29 NOTE — Addendum Note (Signed)
Addended by: Christella Scheuermann C on: 12/29/2020 12:38 PM   Modules accepted: Orders

## 2020-12-29 NOTE — Telephone Encounter (Signed)
Left a message to call our office back regarding results. Orders placed for Echo (future).

## 2021-02-05 ENCOUNTER — Observation Stay (HOSPITAL_COMMUNITY)
Admission: EM | Admit: 2021-02-05 | Discharge: 2021-02-06 | Disposition: A | Discharge status: Home / Self Care | Payer: Medicare Other | Attending: Internal Medicine | Admitting: Internal Medicine

## 2021-02-05 ENCOUNTER — Ambulatory Visit
Admission: EM | Admit: 2021-02-05 | Discharge: 2021-02-05 | Payer: Medicare Other | Attending: Family Medicine | Admitting: Family Medicine

## 2021-02-05 ENCOUNTER — Encounter (HOSPITAL_COMMUNITY): Payer: Self-pay | Admitting: Internal Medicine

## 2021-02-05 ENCOUNTER — Other Ambulatory Visit: Payer: Self-pay

## 2021-02-05 ENCOUNTER — Emergency Department (HOSPITAL_COMMUNITY): Payer: Medicare Other

## 2021-02-05 ENCOUNTER — Ambulatory Visit (INDEPENDENT_AMBULATORY_CARE_PROVIDER_SITE_OTHER): Payer: Medicare Other

## 2021-02-05 DIAGNOSIS — J9601 Acute respiratory failure with hypoxia: Secondary | ICD-10-CM

## 2021-02-05 DIAGNOSIS — Z87891 Personal history of nicotine dependence: Secondary | ICD-10-CM | POA: Insufficient documentation

## 2021-02-05 DIAGNOSIS — R059 Cough, unspecified: Secondary | ICD-10-CM

## 2021-02-05 DIAGNOSIS — I10 Essential (primary) hypertension: Secondary | ICD-10-CM | POA: Diagnosis not present

## 2021-02-05 DIAGNOSIS — Z79899 Other long term (current) drug therapy: Secondary | ICD-10-CM | POA: Insufficient documentation

## 2021-02-05 DIAGNOSIS — J449 Chronic obstructive pulmonary disease, unspecified: Secondary | ICD-10-CM

## 2021-02-05 DIAGNOSIS — R911 Solitary pulmonary nodule: Secondary | ICD-10-CM

## 2021-02-05 DIAGNOSIS — R0981 Nasal congestion: Secondary | ICD-10-CM | POA: Diagnosis not present

## 2021-02-05 DIAGNOSIS — Z20822 Contact with and (suspected) exposure to covid-19: Secondary | ICD-10-CM

## 2021-02-05 DIAGNOSIS — U071 COVID-19: Principal | ICD-10-CM

## 2021-02-05 DIAGNOSIS — Z7982 Long term (current) use of aspirin: Secondary | ICD-10-CM | POA: Diagnosis not present

## 2021-02-05 DIAGNOSIS — J22 Unspecified acute lower respiratory infection: Secondary | ICD-10-CM

## 2021-02-05 DIAGNOSIS — Z720 Tobacco use: Secondary | ICD-10-CM

## 2021-02-05 LAB — COMPREHENSIVE METABOLIC PANEL
ALT: 33 U/L (ref 0–44)
AST: 39 U/L (ref 15–41)
Albumin: 3.4 g/dL — ABNORMAL LOW (ref 3.5–5.0)
Alkaline Phosphatase: 80 U/L (ref 38–126)
Anion gap: 5 (ref 5–15)
BUN: 14 mg/dL (ref 8–23)
CO2: 31 mmol/L (ref 22–32)
Calcium: 7.9 mg/dL — ABNORMAL LOW (ref 8.9–10.3)
Chloride: 102 mmol/L (ref 98–111)
Creatinine, Ser: 0.7 mg/dL (ref 0.61–1.24)
GFR, Estimated: >60 mL/min (ref >60)
Glucose, Bld: 107 mg/dL — ABNORMAL HIGH (ref 70–99)
Potassium: 3.9 mmol/L (ref 3.5–5.1)
Sodium: 138 mmol/L (ref 135–145)
Total Bilirubin: 0.9 mg/dL (ref 0.3–1.2)
Total Protein: 6.7 g/dL (ref 6.5–8.1)

## 2021-02-05 LAB — RESP PANEL BY RT-PCR (FLU A&B, COVID) ARPGX2
Influenza A by PCR: NEGATIVE
Influenza B by PCR: NEGATIVE
SARS Coronavirus 2 by RT PCR: POSITIVE — AB

## 2021-02-05 LAB — CBC WITH DIFFERENTIAL/PLATELET
Abs Immature Granulocytes: 0.01 10*3/uL (ref 0.00–0.07)
Basophils Absolute: 0 10*3/uL (ref 0.0–0.1)
Basophils Relative: 1 %
Eosinophils Absolute: 0.1 10*3/uL (ref 0.0–0.5)
Eosinophils Relative: 2 %
HCT: 44.4 % (ref 39.0–52.0)
Hemoglobin: 14.6 g/dL (ref 13.0–17.0)
Immature Granulocytes: 0 %
Lymphocytes Relative: 33 %
Lymphs Abs: 1.1 10*3/uL (ref 0.7–4.0)
MCH: 32.2 pg (ref 26.0–34.0)
MCHC: 32.9 g/dL (ref 30.0–36.0)
MCV: 97.8 fL (ref 80.0–100.0)
Monocytes Absolute: 0.4 10*3/uL (ref 0.1–1.0)
Monocytes Relative: 11 %
Neutro Abs: 1.8 10*3/uL (ref 1.7–7.7)
Neutrophils Relative %: 53 %
Platelets: 90 10*3/uL — ABNORMAL LOW (ref 150–400)
RBC: 4.54 MIL/uL (ref 4.22–5.81)
RDW: 13.4 % (ref 11.5–15.5)
WBC: 3.3 10*3/uL — ABNORMAL LOW (ref 4.0–10.5)
nRBC: 0 % (ref 0.0–0.2)

## 2021-02-05 LAB — TSH: TSH: 1.851 u[IU]/mL (ref 0.350–4.500)

## 2021-02-05 LAB — BRAIN NATRIURETIC PEPTIDE: B Natriuretic Peptide: 211 pg/mL — ABNORMAL HIGH (ref 0.0–100.0)

## 2021-02-05 LAB — FERRITIN: Ferritin: 254 ng/mL (ref 24–336)

## 2021-02-05 LAB — C-REACTIVE PROTEIN: CRP: 0.6 mg/dL (ref <1.0)

## 2021-02-05 LAB — LACTIC ACID, PLASMA: Lactic Acid, Venous: 0.8 mmol/L (ref 0.5–1.9)

## 2021-02-05 LAB — FIBRINOGEN: Fibrinogen: 339 mg/dL (ref 210–475)

## 2021-02-05 LAB — TRIGLYCERIDES: Triglycerides: 62 mg/dL (ref <150)

## 2021-02-05 LAB — LACTATE DEHYDROGENASE: LDH: 227 U/L — ABNORMAL HIGH (ref 98–192)

## 2021-02-05 LAB — D-DIMER, QUANTITATIVE: D-Dimer, Quant: 1.43 ug/mL-FEU — ABNORMAL HIGH (ref 0.00–0.50)

## 2021-02-05 LAB — PROCALCITONIN: Procalcitonin: <0.1 ng/mL

## 2021-02-05 MED ORDER — ZINC SULFATE 220 (50 ZN) MG PO CAPS
220.0000 mg | ORAL_CAPSULE | Freq: Every day | ORAL | Status: DC
  Administered 2021-02-05 – 2021-02-06 (×2): 220 mg via ORAL
  Filled 2021-02-05 (×2): qty 1

## 2021-02-05 MED ORDER — DEXAMETHASONE SODIUM PHOSPHATE 10 MG/ML IJ SOLN
6.0000 mg | Freq: Once | INTRAMUSCULAR | Status: AC
  Administered 2021-02-05: 6 mg via INTRAVENOUS
  Filled 2021-02-05: qty 1

## 2021-02-05 MED ORDER — ONDANSETRON HCL 4 MG/2ML IJ SOLN: 4.0000 mg | Freq: Four times a day (QID) | INTRAMUSCULAR | Status: DC | PRN

## 2021-02-05 MED ORDER — ALBUTEROL SULFATE HFA 108 (90 BASE) MCG/ACT IN AERS
1.0000 | INHALATION_SPRAY | RESPIRATORY_TRACT | Status: DC | PRN
Start: 2021-02-05 — End: 2021-02-06

## 2021-02-05 MED ORDER — PANTOPRAZOLE SODIUM 40 MG PO TBEC
40.0000 mg | DELAYED_RELEASE_TABLET | Freq: Every day | ORAL | Status: DC
  Administered 2021-02-05 – 2021-02-06 (×2): 40 mg via ORAL
  Filled 2021-02-05 (×2): qty 1

## 2021-02-05 MED ORDER — NICOTINE 7 MG/24HR TD PT24
7.0000 mg | MEDICATED_PATCH | Freq: Every day | TRANSDERMAL | Status: DC
  Administered 2021-02-05 – 2021-02-06 (×2): 7 mg via TRANSDERMAL
  Filled 2021-02-05 (×2): qty 1

## 2021-02-05 MED ORDER — MECLIZINE HCL 12.5 MG PO TABS: 25.0000 mg | ORAL_TABLET | Freq: Every day | ORAL | Status: DC | PRN

## 2021-02-05 MED ORDER — SODIUM CHLORIDE 0.9% FLUSH: 3.0000 mL | INTRAVENOUS | Status: DC | PRN

## 2021-02-05 MED ORDER — SODIUM CHLORIDE 0.9% FLUSH
3.0000 mL | Freq: Two times a day (BID) | INTRAVENOUS | Status: DC
  Administered 2021-02-06: 3 mL via INTRAVENOUS

## 2021-02-05 MED ORDER — SODIUM CHLORIDE 0.9 % IV SOLN
100.0000 mg | Freq: Every day | INTRAVENOUS | Status: DC
  Administered 2021-02-06: 100 mg via INTRAVENOUS
  Filled 2021-02-05 (×2): qty 20

## 2021-02-05 MED ORDER — ALBUTEROL SULFATE HFA 108 (90 BASE) MCG/ACT IN AERS
2.0000 | INHALATION_SPRAY | Freq: Once | RESPIRATORY_TRACT | Status: AC
  Administered 2021-02-05: 2 via RESPIRATORY_TRACT
  Filled 2021-02-05: qty 6.7

## 2021-02-05 MED ORDER — DEXAMETHASONE SODIUM PHOSPHATE 10 MG/ML IJ SOLN
6.0000 mg | INTRAMUSCULAR | Status: DC
  Administered 2021-02-06: 6 mg via INTRAVENOUS
  Filled 2021-02-05: qty 1

## 2021-02-05 MED ORDER — HYDROCOD POLST-CPM POLST ER 10-8 MG/5ML PO SUER: 5.0000 mL | Freq: Two times a day (BID) | ORAL | Status: DC | PRN

## 2021-02-05 MED ORDER — SODIUM CHLORIDE 0.9 % IV SOLN
100.0000 mg | INTRAVENOUS | Status: AC
  Administered 2021-02-05 (×2): 100 mg via INTRAVENOUS
  Filled 2021-02-05 (×2): qty 20

## 2021-02-05 MED ORDER — SODIUM CHLORIDE 0.9 % IV SOLN: 250.0000 mL | INTRAVENOUS | Status: DC | PRN

## 2021-02-05 MED ORDER — RAMIPRIL 5 MG PO CAPS
10.0000 mg | ORAL_CAPSULE | Freq: Every day | ORAL | Status: DC
  Filled 2021-02-05 (×3): qty 2

## 2021-02-05 MED ORDER — ACETAMINOPHEN 325 MG PO TABS: 650.0000 mg | ORAL_TABLET | Freq: Four times a day (QID) | ORAL | Status: DC | PRN

## 2021-02-05 MED ORDER — GUAIFENESIN-DM 100-10 MG/5ML PO SYRP: 10.0000 mL | ORAL_SOLUTION | ORAL | Status: DC | PRN

## 2021-02-05 MED ORDER — ASCORBIC ACID 500 MG PO TABS
500.0000 mg | ORAL_TABLET | Freq: Every day | ORAL | Status: DC
  Administered 2021-02-05 – 2021-02-06 (×2): 500 mg via ORAL
  Filled 2021-02-05 (×2): qty 1

## 2021-02-05 MED ORDER — ONDANSETRON HCL 4 MG PO TABS: 4.0000 mg | ORAL_TABLET | Freq: Four times a day (QID) | ORAL | Status: DC | PRN

## 2021-02-05 NOTE — ED Notes (Signed)
Travis Skeeter, MD told that pt was placed on 2 L [place] d/t O2 sat 88% while in bed, per Travis Skeeter, MD the pt had removed O2 when he went to the room, the pt is encouraged to keep his O2 [place] in his nose

## 2021-02-05 NOTE — ED Provider Notes (Signed)
[facility]    CSN: [id number] Arrival date & time: 02/05/21  1011      History   Chief Complaint Chief Complaint  Patient presents with   Nasal Congestion    HPI Arlie Riker is a 86 y.o. male.   HPI Patient presents today for evaluation of cough and nasal congestion for over 1 week.  On arrival patient's oxygen level was 89% at rest and he was slightly tachypneic.  Patient denies any symptoms of shortness of breath.  Patient has a complex medical history including a history of a TAVR procedure, pulmonary nodule, essential hypertension and type 2 diabetes.  Patient has been exposed to [place] however is afebrile.  He also has a history of emphysema.  He is not on any home oxygen.  In review of his most recent medical visits his baseline oxygen level is 95%.  And average blood pressure readings ranges in the upper 120's/r 70s.  He is transported here today by his daughter.  Past Medical History:  Diagnosis Date   Arthritis    Essential hypertension    Hyperlipemia    Hyperplastic colon polyp 2002   Dr. Arnoldo Morale   Pulmonary nodule    a. right lower lobe. 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 severe AS by echo in 2017 b. 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   Hyperlipemia    Tobacco abuse    Incidental pulmonary nodule, greater than or equal to 35mm 04/06/2018   Severe aortic valve stenosis    Essential hypertension    History of colon polyps 07/15/2011    Past Surgical History:  Procedure Laterality Date   CATARACT EXTRACTION Right     COLONOSCOPY  08/17/2001   Small polyp measured 3 mm removed/multiple middle scattered diverticula in the ascending colon-hyperplastic   COLONOSCOPY  08/14/2011   Procedure: COLONOSCOPY;  Surgeon: Daneil Dolin, MD;  [facility];  Service: Endoscopy;  Laterality: N/A;  10:00   EXCISIONAL HEMORRHOIDECTOMY     INTRAOPERATIVE TRANSTHORACIC ECHOCARDIOGRAM N/A 04/21/2018   Procedure: INTRAOPERATIVE TRANSTHORACIC ECHOCARDIOGRAM;  Surgeon: Burnell Blanks, MD;  Location: [facility];  Service: Open Heart Surgery;  Laterality: N/A;   RIGHT/LEFT HEART CATH AND CORONARY ANGIOGRAPHY N/A 03/25/2018   Procedure: RIGHT/LEFT HEART CATH AND CORONARY ANGIOGRAPHY;  Surgeon: Burnell Blanks, MD;  Location: [facility];  Service: Cardiovascular;  Laterality: N/A;   TRANSCATHETER AORTIC VALVE REPLACEMENT, TRANSFEMORAL  04/21/2018   TRANSCATHETER AORTIC VALVE REPLACEMENT, TRANSFEMORAL N/A 04/21/2018   Procedure: TRANSCATHETER AORTIC VALVE REPLACEMENT, TRANSFEMORAL;  Surgeon: Burnell Blanks, MD;  Location: [facility];  Service: Open Heart Surgery;  Laterality: N/A;       Home Medications    Prior to Admission medications   Medication Sig Start Date End Date Taking? Authorizing Provider  amoxicillin (AMOXIL) 500 MG tablet Take 2000 mg (4 tabs) 1 hour before any dental work or procedures 04/29/18   Eileen Stanford, PA-C  aspirin EC 81 MG tablet Take 81 mg  by mouth daily.    [provider]  atorvastatin (LIPITOR) 10 MG tablet Take 10 mg by mouth daily. 11/15/15   [provider]  meclizine (ANTIVERT) 25 MG tablet Take 25 mg by mouth as directed.    [provider]  ramipril (ALTACE) 10 MG capsule Take 10 mg by mouth daily. 04/24/11   [provider]  sildenafil (REVATIO) 20 MG tablet TAKE 2 TO 5 TABLETS AS NEEDED 1 HOUR PRIOR TO INTERCOURSE (PRIOR AUTH DENIED) 12/19/18   [provider]    Family History Family History  Problem Relation  Age of Onset   Stroke Mother    Heart attack Father    Liver disease Sister    Renal Disease Brother     Social History Social History   Tobacco Use   Smoking status: Former    Packs/day: 1.00    Years: 65.00    Pack years: 65.00    Types: Cigarettes    Quit date: 05/20/2018    Years since quitting: 2.7   Smokeless tobacco: Never  Vaping Use   Vaping Use: Never used  Substance Use Topics   Alcohol use: Not Currently    Alcohol/week: 0.0 standard drinks   Drug use: Never     Allergies   Patient has no known allergies.   Review of Systems Review of Systems Pertinent negatives listed in HPI   Physical Exam Triage Vital Signs ED Triage Vitals [02/05/21 1025]  Enc Vitals Group     BP 117/68     Pulse Rate 70     Resp 20     Temp 98.1 F (36.7 C)     Temp src      SpO2 (!) 89 %     Weight      Height      Head Circumference      Peak Flow      Pain Score      Pain Loc      Pain Edu?      Excl. in [place]?    No data found.  Updated Vital Signs BP 117/68   Pulse 70   Temp 98.1 F (36.7 C)   Resp 20   SpO2 (!) 86%   Visual Acuity Right Eye Distance:   Left Eye Distance:   Bilateral Distance:    Right Eye Near:   Left Eye Near:    Bilateral Near:     Physical Exam Constitutional:      Appearance: He is ill-appearing.  Cardiovascular:     Rate and Rhythm: Normal rate and regular rhythm.     Heart sounds: Murmur heard.  Pulmonary:     Effort: Tachypnea and accessory muscle usage present.     Breath sounds: Examination of the right-middle field reveals rales. Examination of the left-middle field reveals rales. Rhonchi and rales present.  Neurological:     Mental Status: He is alert.  Psychiatric:        Attention and Perception: Attention normal.        Speech: Speech normal.     UC Treatments / Results  Labs (all labs ordered are listed, but only abnormal results are displayed) Labs Reviewed - No data to display  EKG   Radiology DG  Chest 2 View  Result Date: 02/05/2021 CLINICAL DATA:  Nasal congestion and cough for the past week, low oxygen saturation EXAM: CHEST - 2 VIEW COMPARISON:  04/21/2018 FINDINGS: Normal heart size post TAVR. Mediastinal contours and pulmonary  vascularity normal. Atherosclerotic calcification aorta. Bronchitic changes with improved basilar atelectasis versus previous exam. Slight chronic accentuation of interstitial markings. No acute infiltrate, pleural effusion, or pneumothorax. Bones demineralized. IMPRESSION: Bronchitic changes without infiltrate. Electronically Signed   By: Lavonia Dana M.D.   On: 02/05/2021 10:42    Procedures Procedures (including critical care time)  Medications Ordered in UC Medications - No data to display  Initial Impression / Assessment and Plan / UC Course  I have reviewed the triage vital signs and the nursing notes.  Pertinent labs & imaging results that were available during my care of the patient were reviewed by me and considered in my medical decision making (see chart for details).     Patient presents today with 1 week of coughing and congestion on arrival oxygen level was 89% on exam oxygen level dropped to 86% and never regained above 87 during lung examination.  Patient remains tachypneic.  Discussed with family to transport patient over to the ER as symptoms could be related to an active COVID-19 infection or this could be related to his underlying cardiopulmonary disease.  Chest x-ray shows slight interstitial markings with bronchitis typ changes. Patient is being transported by his daughter to [facility] Final Clinical Impressions(s) / UC Diagnoses   Final diagnoses:  Acute respiratory failure with hypoxia ([place])  Suspected COVID-19 virus infection  Viral infection of lower respiratory system   Discharge Instructions   None    ED Prescriptions   None    PDMP not reviewed this encounter.   Scot Jun, FNP 02/05/21 1102

## 2021-02-05 NOTE — ED Triage Notes (Signed)
Pt presents with nasal congestion and cough for past week

## 2021-02-05 NOTE — ED Provider Notes (Signed)
[facility] EMERGENCY DEPARTMENT Provider Note   CSN: [id number] Arrival date & time: 02/05/21  1105     History Chief Complaint  Patient presents with   Cough    Travis Murphy is a 86 y.o. male.  HPI 86 year old male presents from urgent care with low oxygen.  He has been having cough and rhinorrhea for about a week.  He has not had a fever, shortness of breath, chest pain or vomiting.  No leg swelling.  He is concerned he might have COVID so he went to go get tested.  At urgent care his sats were in the mid to high 80s on oxygen though he was denying dyspnea.  Thus he was sent here.  Right now he feels fine.  He has received 2 vaccines versus COVID   Past Medical History:  Diagnosis Date   Arthritis    Essential hypertension    Hyperlipemia    Hyperplastic colon polyp 2002   Dr. Arnoldo Morale   Pulmonary nodule    a. right lower lobe. 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 severe AS by echo in 2017 b. 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   Hyperlipemia    Tobacco abuse    Incidental pulmonary nodule, greater than or equal to 68mm 04/06/2018   Severe aortic valve stenosis    Essential hypertension    History of colon polyps 07/15/2011    Past Surgical History:  Procedure Laterality Date   CATARACT EXTRACTION Right    COLONOSCOPY  08/17/2001   Small polyp measured 3 mm removed/multiple middle scattered diverticula in the ascending colon-hyperplastic   COLONOSCOPY  08/14/2011    Procedure: COLONOSCOPY;  Surgeon: Daneil Dolin, MD;  [facility];  Service: Endoscopy;  Laterality: N/A;  10:00   EXCISIONAL HEMORRHOIDECTOMY     INTRAOPERATIVE TRANSTHORACIC ECHOCARDIOGRAM N/A 04/21/2018   Procedure: INTRAOPERATIVE TRANSTHORACIC ECHOCARDIOGRAM;  Surgeon: Burnell Blanks, MD;  Location: [place];  Service: Open Heart Surgery;  Laterality: N/A;   RIGHT/LEFT HEART CATH AND CORONARY ANGIOGRAPHY N/A 03/25/2018   Procedure: RIGHT/LEFT HEART CATH AND CORONARY ANGIOGRAPHY;  Surgeon: Burnell Blanks, MD;  Location: [place] CV LAB;  Service: Cardiovascular;  Laterality: N/A;   TRANSCATHETER AORTIC VALVE REPLACEMENT, TRANSFEMORAL  04/21/2018   TRANSCATHETER AORTIC VALVE REPLACEMENT, TRANSFEMORAL N/A 04/21/2018   Procedure: TRANSCATHETER AORTIC VALVE REPLACEMENT, TRANSFEMORAL;  Surgeon: Burnell Blanks, MD;  Location: [place];  Service: Open Heart Surgery;  Laterality: N/A;       Family History  Problem Relation Age of Onset   Stroke Mother    Heart attack Father    Liver disease Sister    Renal Disease Brother     Social History   Tobacco Use   Smoking status: Former    Packs/day: 1.00    Years: 65.00    Pack years: 65.00    Types: Cigarettes    Quit date: 05/20/2018    Years since  quitting: 2.7   Smokeless tobacco: Never  Vaping Use   Vaping Use: Never used  Substance Use Topics   Alcohol use: Not Currently    Alcohol/week: 0.0 standard drinks   Drug use: Never    Home Medications Prior to Admission medications   Medication Sig Start Date End Date Taking? Authorizing Provider  amoxicillin (AMOXIL) 500 MG tablet Take 2000 mg (4 tabs) 1 hour before any dental work or procedures 04/29/18   Eileen Stanford, PA-C  aspirin EC 81 MG tablet Take 81 mg by mouth daily.    [provider]  atorvastatin (LIPITOR) 10 MG tablet Take 10 mg by mouth daily. 11/15/15   [provider]  meclizine (ANTIVERT) 25 MG tablet Take 25 mg  by mouth as directed.    [provider]  ramipril (ALTACE) 10 MG capsule Take 10 mg by mouth daily. 04/24/11   [provider]  sildenafil (REVATIO) 20 MG tablet TAKE 2 TO 5 TABLETS AS NEEDED 1 HOUR PRIOR TO INTERCOURSE (PRIOR AUTH DENIED) 12/19/18   [provider]    Allergies    Patient has no known allergies.  Review of Systems   Review of Systems  Constitutional:  Negative for fever.  HENT:  Positive for rhinorrhea.   Respiratory:  Positive for cough. Negative for shortness of breath.   Cardiovascular:  Negative for chest pain and leg swelling.  All other systems reviewed and are negative.  Physical Exam Updated Vital Signs BP (!) 161/74   Pulse 68   Temp 98.5 F (36.9 C)   Resp (!) 22   Ht 5\' 6"  (1.676 m)   Wt 61.2 kg   SpO2 94%   BMI 21.79 kg/m   Physical Exam Vitals and nursing note reviewed.  Constitutional:      Appearance: He is well-developed.  HENT:     Head: Normocephalic and atraumatic.     Right Ear: External ear normal.     Left Ear: External ear normal.     Nose: Nose normal.  Eyes:     General:        Right eye: No discharge.        Left eye: No discharge.  Cardiovascular:     Rate and Rhythm: Normal rate and regular rhythm.     Heart sounds: Normal heart sounds.  Pulmonary:     Effort: Pulmonary effort is normal.     Comments: Mildly decreased BS, mild wheezing but is inconsistent Abdominal:     Palpations: Abdomen is soft.     Tenderness: There is no abdominal tenderness.  Musculoskeletal:     Cervical back: Neck supple.     Right lower leg: No edema.     Left lower leg: No edema.  Skin:    General: Skin is warm and dry.  Neurological:     Mental Status: He is alert.  Psychiatric:        Mood and Affect: Mood is not anxious.    ED Results / Procedures / Treatments   Labs (all labs ordered are listed, but only abnormal results are displayed) Labs Reviewed  RESP PANEL BY RT-PCR (FLU A&B, COVID) [id number] -  Abnormal; Notable for the following components:      Result Value   SARS Coronavirus 2 by RT PCR POSITIVE (*)    All other components within normal limits  COMPREHENSIVE METABOLIC PANEL - Abnormal; Notable for the following components:   Glucose, Bld 107 (*)    Calcium  7.9 (*)    Albumin 3.4 (*)    All other components within normal limits  CBC WITH DIFFERENTIAL/PLATELET - Abnormal; Notable for the following components:   WBC 3.3 (*)    Platelets 90 (*)    All other components within normal limits  BRAIN NATRIURETIC PEPTIDE - Abnormal; Notable for the following components:   B Natriuretic Peptide 211.0 (*)    All other components within normal limits  D-DIMER, QUANTITATIVE - Abnormal; Notable for the following components:   D-Dimer, Quant 1.43 (*)    All other components within normal limits  CULTURE, BLOOD (ROUTINE X 2)  CULTURE, BLOOD (ROUTINE X 2)  LACTIC ACID, PLASMA  LACTIC ACID, PLASMA  PROCALCITONIN  LACTATE DEHYDROGENASE  FERRITIN  TRIGLYCERIDES  FIBRINOGEN  C-REACTIVE PROTEIN    EKG EKG Interpretation  Date/Time:  Monday February 05 2021 12:36:34 EDT Ventricular Rate:  64 PR Interval:  172 QRS Duration: 80 QT Interval:  436 QTC Calculation: 449 R Axis:   -27 Text Interpretation: Normal sinus rhythm Septal infarct , age undetermined  similar to 2019 Confirmed by Sherwood Gambler [phone]) on 02/05/2021 12:50:52 PM  Radiology DG Chest 2 View  Result Date: 02/05/2021 CLINICAL DATA:  Nasal congestion and cough for the past week, low oxygen saturation EXAM: CHEST - 2 VIEW COMPARISON:  04/21/2018 FINDINGS: Normal heart size post TAVR. Mediastinal contours and pulmonary vascularity normal. Atherosclerotic calcification aorta. Bronchitic changes with improved basilar atelectasis versus previous exam. Slight chronic accentuation of interstitial markings. No acute infiltrate, pleural effusion, or pneumothorax. Bones demineralized. IMPRESSION: Bronchitic changes without  infiltrate. Electronically Signed   By: Lavonia Dana M.D.   On: 02/05/2021 10:42    Procedures Procedures   Medications Ordered in ED Medications  dexamethasone (DECADRON) injection 6 mg (has no administration in time range)  albuterol (VENTOLIN HFA) 108 (90 Base) MCG/ACT inhaler 2 puff (2 puffs Inhalation Given 02/05/21 1207)    ED Course  I have reviewed the triage vital signs and the nursing notes.  Pertinent labs & imaging results that were available during my care of the patient were reviewed by me and considered in my medical decision making (see chart for details).    MDM Rules/Calculators/A&P                          Patient presents with mild hypoxia and respiratory symptoms.  Found to be COVID positive.  Sats are in the 86-87 range on room air.  While he is not in distress he will need supplemental O2 and to be admitted.  I have started dexamethasone.  Further talking the patient, he thinks his symptoms started mid week last week which would put him at right around 5 days and so we will also start remdesivir.  Discussed with Dr. Manuella Ghazi for admission. Updated daughter over the phone.  Travis Murphy was evaluated in Emergency Department on 02/05/2021 for the symptoms described in the history of present illness. He was evaluated in the context of the global COVID-19 pandemic, which necessitated consideration that the patient might be at risk for infection with the SARS-CoV-2 virus that causes COVID-19. Institutional protocols and algorithms that pertain to the evaluation of patients at risk for COVID-19 are in a state of rapid change based on information released by regulatory bodies including the CDC and federal and state organizations. These policies and algorithms were followed during the patient's care in the ED.  Final Clinical Impression(s) / ED Diagnoses Final  diagnoses:  COVID-19 virus infection  Acute respiratory failure with hypoxia [facility])    Rx / DC Orders ED  Discharge Orders     None        Sherwood Gambler, MD 02/05/21 1417

## 2021-02-05 NOTE — ED Notes (Signed)
Pt up without calling for assistance, pt removed O2 and monitoring, sats at 75%, pt placed back on oxygen, encouraged to call for help, pt verbalizes understanding

## 2021-02-05 NOTE — ED Notes (Signed)
Regenia Skeeter, MD contacting the pts daughter to provide an update

## 2021-02-05 NOTE — ED Notes (Signed)
o2 sat 91-93% while ambulating

## 2021-02-05 NOTE — ED Triage Notes (Signed)
Pt was just seen at Urgent Care and sent here for low O2 but pt can not tell me how low it was. Pt c/o congestion and "not feeling well." Sats 92% on RA in triage

## 2021-02-05 NOTE — ED Notes (Signed)
O2 stat would not reach above 89% even while deep breathing.

## 2021-02-05 NOTE — ED Notes (Signed)
Regenia Skeeter, MD notified re: Covid Positive result

## 2021-02-05 NOTE — H&P (Signed)
History and Physical    Travis Murphy TZG:[id number] DOB: [birth year] DOA: 02/05/2021  PCP: Asencion Noble, MD   Patient coming from: Urgent care  Chief Complaint: Cough and nasal congestion for 1 week  HPI: Travis Murphy is a 86 y.o. male with medical history significant for chronic tobacco abuse, hypertension, dyslipidemia, and aortic aneurysm who presented to the urgent care today due to cough and nasal congestion that has been ongoing for 1 week.  He has had 2 prior COVID vaccinations over 1 year ago and has not had any booster vaccinations.  He was noted to have hypoxemia with oxygen level 89% at rest and was sent to the ED for further evaluation.  He denies any fever or chills or recent sick contacts.  He likely has some history of emphysema, but does not wear home oxygen on any usual basis.   ED Course: Stable vital signs noted.  Patient noted to have platelet count of 90,000 and white blood cell count of 3.3.  BNP 211.  Chest x-ray with bronchiectatic changes with no significant infiltrates.  D-dimer 1.43.  He is noted to require 2 L nasal cannula at this time.  He is otherwise comfortable and in no respiratory distress.  Review of Systems: Reviewed as noted above, otherwise negative.  Past Medical History:  Diagnosis Date   Arthritis    Essential hypertension    Hyperlipemia    Hyperplastic colon polyp 2002   Dr. Arnoldo Morale   Pulmonary nodule    a. right lower lobe. 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 severe AS by echo in 2017 b. 01/2018: repeat echo showing EF of 55-60%, Grade 2 DD, and severe AS with mean gradient of 58 mm Hg   Vertigo     Past Surgical History:  Procedure Laterality Date   CATARACT EXTRACTION Right    COLONOSCOPY  08/17/2001   Small  polyp measured 3 mm removed/multiple middle scattered diverticula in the ascending colon-hyperplastic   COLONOSCOPY  08/14/2011   Procedure: COLONOSCOPY;  Surgeon: Daneil Dolin, MD;  [facility];  Service: Endoscopy;  Laterality: N/A;  10:00   EXCISIONAL HEMORRHOIDECTOMY     INTRAOPERATIVE TRANSTHORACIC ECHOCARDIOGRAM N/A 04/21/2018   Procedure: INTRAOPERATIVE TRANSTHORACIC ECHOCARDIOGRAM;  Surgeon: Burnell Blanks, MD;  Location: [place];  Service: Open Heart Surgery;  Laterality: N/A;   RIGHT/LEFT HEART CATH AND CORONARY ANGIOGRAPHY N/A 03/25/2018   Procedure: RIGHT/LEFT HEART CATH AND CORONARY ANGIOGRAPHY;  Surgeon: Burnell Blanks, MD;  Location: [place] CV LAB;  Service: Cardiovascular;  Laterality: N/A;   TRANSCATHETER AORTIC VALVE REPLACEMENT, TRANSFEMORAL  04/21/2018   TRANSCATHETER AORTIC VALVE REPLACEMENT, TRANSFEMORAL N/A 04/21/2018   Procedure: TRANSCATHETER AORTIC VALVE REPLACEMENT, TRANSFEMORAL;  Surgeon: Burnell Blanks, MD;  Location: [place];  Service: Open Heart Surgery;  Laterality: N/A;     reports that he quit smoking about 2 years ago. His smoking use included cigarettes. He has a 65.00 pack-year smoking history. He has never used smokeless tobacco. He reports previous alcohol use. He reports that he does not use drugs.  No Known Allergies  Family History  Problem Relation Age of Onset   Stroke Mother    Heart attack Father    Liver disease Sister    Renal  Disease Brother     Prior to Admission medications   Medication Sig Start Date End Date Taking? Authorizing Provider  aspirin EC 81 MG tablet Take 81 mg by mouth daily.   Yes [provider]  atorvastatin (LIPITOR) 10 MG tablet Take 10 mg by mouth daily. 11/15/15  Yes [provider]  meclizine (ANTIVERT) 25 MG tablet Take 25 mg by mouth daily as needed for dizziness.   Yes [provider]  ramipril (ALTACE) 10 MG capsule Take 10 mg by mouth daily.  04/24/11  Yes [provider]  amoxicillin (AMOXIL) 500 MG tablet Take 2000 mg (4 tabs) 1 hour before any dental work or procedures 04/29/18   Travis Murphy, [place]    Physical Exam: Vitals:   02/05/21 1445 02/05/21 1515 02/05/21 1530 02/05/21 1537  BP:   (!) 145/77   Pulse: (!) 48 64 63   Resp: (!) 23 17 (!) 25   Temp:    98.3 F (36.8 C)  TempSrc:    Oral  SpO2: (!) 88% 93% 98%   Weight:      Height:        Constitutional: NAD, calm, comfortable Vitals:   02/05/21 1445 02/05/21 1515 02/05/21 1530 02/05/21 1537  BP:   (!) 145/77   Pulse: (!) 48 64 63   Resp: (!) 23 17 (!) 25   Temp:    98.3 F (36.8 C)  TempSrc:    Oral  SpO2: (!) 88% 93% 98%   Weight:      Height:       Eyes: lids and conjunctivae normal Neck: normal, supple Respiratory: clear to auscultation bilaterally. Normal respiratory effort. No accessory muscle use.  Currently on 2 L nasal cannula oxygen. Cardiovascular: Regular rate and rhythm, no murmurs. Abdomen: no tenderness, no distention. Bowel sounds positive.  Musculoskeletal:  No edema. Skin: no rashes, lesions, ulcers.  Psychiatric: Flat affect  Labs on Admission: I have personally reviewed following labs and imaging studies  CBC: Recent Labs  Lab 02/05/21 1155  WBC 3.3*  NEUTROABS 1.8  HGB 14.6  HCT 44.4  MCV 97.8  PLT 90*   Basic Metabolic Panel: Recent Labs  Lab 02/05/21 1155  NA 138  K 3.9  CL 102  CO2 31  GLUCOSE 107*  BUN 14  CREATININE 0.70  CALCIUM 7.9*   GFR: Estimated Creatinine Clearance: 59.5 mL/min (by C-G formula based on SCr of 0.7 mg/dL). Liver Function Tests: Recent Labs  Lab 02/05/21 1155  AST 39  ALT 33  ALKPHOS 80  BILITOT 0.9  PROT 6.7  ALBUMIN 3.4*   No results for input(s): LIPASE, AMYLASE in the last 168 hours. No results for input(s): AMMONIA in the last 168 hours. Coagulation Profile: No results for input(s): INR, PROTIME in the last 168 hours. Cardiac Enzymes: No results for  input(s): CKTOTAL, CKMB, CKMBINDEX, TROPONINI in the last 168 hours. BNP (last 3 results) No results for input(s): PROBNP in the last 8760 hours. HbA1C: No results for input(s): HGBA1C in the last 72 hours. CBG: No results for input(s): GLUCAP in the last 168 hours. Lipid Profile: Recent Labs    02/05/21 1156  TRIG 62   Thyroid Function Tests: No results for input(s): TSH, T4TOTAL, FREET4, T3FREE, THYROIDAB in the last 72 hours. Anemia Panel: Recent Labs    02/05/21 1156  FERRITIN 254   Urine analysis:    Component Value Date/Time   COLORURINE STRAW (A) 04/17/2018 [place] 04/17/2018  2707   LABSPEC 1.004 (L) 04/17/2018 0821   PHURINE 6.0 04/17/2018 0821   GLUCOSEU NEGATIVE 04/17/2018 0821   HGBUR NEGATIVE 04/17/2018 0821   BILIRUBINUR NEGATIVE 04/17/2018 0821   KETONESUR NEGATIVE 04/17/2018 0821   PROTEINUR NEGATIVE 04/17/2018 0821   NITRITE NEGATIVE 04/17/2018 0821   LEUKOCYTESUR TRACE (A) 04/17/2018 0821    Radiological Exams on Admission: DG Chest 2 View  Result Date: 02/05/2021 CLINICAL DATA:  Nasal congestion and cough for the past week, low oxygen saturation EXAM: CHEST - 2 VIEW COMPARISON:  04/21/2018 FINDINGS: Normal heart size post TAVR. Mediastinal contours and pulmonary vascularity normal. Atherosclerotic calcification aorta. Bronchitic changes with improved basilar atelectasis versus previous exam. Slight chronic accentuation of interstitial markings. No acute infiltrate, pleural effusion, or pneumothorax. Bones demineralized. IMPRESSION: Bronchitic changes without infiltrate. Electronically Signed   By: Lavonia Dana M.D.   On: 02/05/2021 10:42    EKG: Independently reviewed.  NSR, 64 bpm  Assessment/Plan Active Problems:   Acute hypoxemic respiratory failure due to COVID-19 [facility])    Acute hypoxemic respiratory failure secondary to COVID-19 pneumonia -Continues to smoke -2 prior COVID vaccinations, but no booster -Start remdesivir and  Decadron, due to symptoms beginning within the last 5 days and some hypoxemia -Follow inflammatory markers trend -Wean oxygen as tolerated  Ongoing tobacco abuse -1 pack/day for many years -Counseled on cessation -Offered nicotine patch -Family would appreciate pulmonology referral on discharge  Recent weight loss -Plan to check TSH -CT imaging without any signs of pulmonary nodules on 12/21  Hypertension-controlled -Continue home ramipril  Dyslipidemia -Continue statin  Thrombocytopenia -Monitor repeat in a.m. and avoid heparin agents -Hold home aspirin  DVT prophylaxis: SCDs Code Status: Full Family Communication: Discussed with daughter on phone 6/13 Disposition Plan:Admit for COVID treatment Consults called:None Admission status: Inpatient, [facility]  If 7PM-7AM, please contact night-coverage [email or web address]  02/05/2021, 3:59 PM

## 2021-02-05 NOTE — Progress Notes (Signed)
Patient in room, resting comfortably. No s/sx of distress noted. Vitals stable. Remains on O2 via [place]. Good appetite noted

## 2021-02-06 DIAGNOSIS — J9601 Acute respiratory failure with hypoxia: Secondary | ICD-10-CM | POA: Diagnosis not present

## 2021-02-06 DIAGNOSIS — U071 COVID-19: Secondary | ICD-10-CM | POA: Diagnosis not present

## 2021-02-06 LAB — CBC WITH DIFFERENTIAL/PLATELET
Abs Immature Granulocytes: 0.01 10*3/uL (ref 0.00–0.07)
Basophils Absolute: 0 10*3/uL (ref 0.0–0.1)
Basophils Relative: 0 %
Eosinophils Absolute: 0 10*3/uL (ref 0.0–0.5)
Eosinophils Relative: 0 %
HCT: 43.3 % (ref 39.0–52.0)
Hemoglobin: 14.3 g/dL (ref 13.0–17.0)
Immature Granulocytes: 0 %
Lymphocytes Relative: 30 %
Lymphs Abs: 1.1 10*3/uL (ref 0.7–4.0)
MCH: 32.4 pg (ref 26.0–34.0)
MCHC: 33 g/dL (ref 30.0–36.0)
MCV: 98.2 fL (ref 80.0–100.0)
Monocytes Absolute: 0.4 10*3/uL (ref 0.1–1.0)
Monocytes Relative: 10 %
Neutro Abs: 2.2 10*3/uL (ref 1.7–7.7)
Neutrophils Relative %: 60 %
Platelets: 82 10*3/uL — ABNORMAL LOW (ref 150–400)
RBC: 4.41 MIL/uL (ref 4.22–5.81)
RDW: 13.2 % (ref 11.5–15.5)
WBC: 3.7 10*3/uL — ABNORMAL LOW (ref 4.0–10.5)
nRBC: 0 % (ref 0.0–0.2)

## 2021-02-06 LAB — COMPREHENSIVE METABOLIC PANEL
ALT: 33 U/L (ref 0–44)
AST: 35 U/L (ref 15–41)
Albumin: 3.1 g/dL — ABNORMAL LOW (ref 3.5–5.0)
Alkaline Phosphatase: 75 U/L (ref 38–126)
Anion gap: 7 (ref 5–15)
BUN: 16 mg/dL (ref 8–23)
CO2: 28 mmol/L (ref 22–32)
Calcium: 8 mg/dL — ABNORMAL LOW (ref 8.9–10.3)
Chloride: 103 mmol/L (ref 98–111)
Creatinine, Ser: 0.82 mg/dL (ref 0.61–1.24)
GFR, Estimated: >60 mL/min (ref >60)
Glucose, Bld: 101 mg/dL — ABNORMAL HIGH (ref 70–99)
Potassium: 5 mmol/L (ref 3.5–5.1)
Sodium: 138 mmol/L (ref 135–145)
Total Bilirubin: 1 mg/dL (ref 0.3–1.2)
Total Protein: 6.1 g/dL — ABNORMAL LOW (ref 6.5–8.1)

## 2021-02-06 LAB — C-REACTIVE PROTEIN: CRP: 0.6 mg/dL (ref <1.0)

## 2021-02-06 LAB — MAGNESIUM: Magnesium: 2.2 mg/dL (ref 1.7–2.4)

## 2021-02-06 LAB — FERRITIN: Ferritin: 267 ng/mL (ref 24–336)

## 2021-02-06 LAB — D-DIMER, QUANTITATIVE: D-Dimer, Quant: 1.37 ug/mL-FEU — ABNORMAL HIGH (ref 0.00–0.50)

## 2021-02-06 MED ORDER — GUAIFENESIN-DM 100-10 MG/5ML PO SYRP: 10.0000 mL | ORAL_SOLUTION | ORAL | 0 refills | Status: DC | PRN

## 2021-02-06 MED ORDER — ALBUTEROL SULFATE HFA 108 (90 BASE) MCG/ACT IN AERS: 1.0000 | INHALATION_SPRAY | Freq: Four times a day (QID) | RESPIRATORY_TRACT | 1 refills | Status: DC | PRN

## 2021-02-06 MED ORDER — DEXAMETHASONE 6 MG PO TABS: 6.0000 mg | ORAL_TABLET | Freq: Every day | ORAL | 0 refills | Status: AC

## 2021-02-06 MED ORDER — PANTOPRAZOLE SODIUM 40 MG PO TBEC
40.0000 mg | DELAYED_RELEASE_TABLET | Freq: Every day | ORAL | 0 refills | Status: DC
Start: 2021-02-07 — End: 2021-03-06

## 2021-02-06 NOTE — Care Management CC44 (Signed)
Condition Code 44 Documentation Completed  Patient Details  Name: Travis Murphy MRN: [id number] Date of Birth: 04/23/1935   Condition Code 44 given:  Yes Patient signature on Condition Code 44 notice:  Yes Documentation of 2 MD's agreement:  Yes Code 44 added to claim:  Yes    Boneta Lucks, RN 02/06/2021, 10:27 AM

## 2021-02-06 NOTE — Progress Notes (Signed)
Nsg Discharge Note  Admit Date:  02/05/2021 Discharge date: 02/06/2021   Travis Murphy to be D/C'd Home per MD order.  AVS completed.  Patient/caregiver able to verbalize understanding.  Discharge Medication: Allergies as of 02/06/2021   No Known Allergies      Medication List     STOP taking these medications    amoxicillin 500 MG tablet Commonly known as: AMOXIL       TAKE these medications    albuterol 108 (90 Base) MCG/ACT inhaler Commonly known as: VENTOLIN HFA Inhale 1-2 puffs into the lungs every 6 (six) hours as needed for wheezing or shortness of breath.   aspirin EC 81 MG tablet Take 81 mg by mouth daily.   atorvastatin 10 MG tablet Commonly known as: LIPITOR Take 10 mg by mouth daily.   dexamethasone 6 MG tablet Commonly known as: DECADRON Take 1 tablet (6 mg total) by mouth daily for 5 days.   guaiFENesin-dextromethorphan 100-10 MG/5ML syrup Commonly known as: ROBITUSSIN DM Take 10 mLs by mouth every 4 (four) hours as needed for cough.   meclizine 25 MG tablet Commonly known as: ANTIVERT Take 25 mg by mouth daily as needed for dizziness.   pantoprazole 40 MG tablet Commonly known as: PROTONIX Take 1 tablet (40 mg total) by mouth daily for 10 days. Start taking on: February 07, 2021   ramipril 10 MG capsule Commonly known as: ALTACE Take 10 mg by mouth daily.        Discharge Assessment: Vitals:   02/05/21 2137 02/06/21 0607  BP: 134/70 (!) 88/77  Pulse: 60 60  Resp: 18 18  Temp: 98.3 F (36.8 C) 97.8 F (36.6 C)  SpO2: 97% 91%   Skin clean, dry and intact without evidence of skin break down, no evidence of skin tears noted. IV catheter discontinued intact. Site without signs and symptoms of complications - no redness or edema noted at insertion site, patient denies c/o pain - only slight tenderness at site.  Dressing with slight pressure applied.  D/c Instructions-Education: Discharge instructions given to patient/family with  verbalized understanding. D/c education completed with patient/family including follow up instructions, medication list, d/c activities limitations if indicated, with other d/c instructions as indicated by MD - patient able to verbalize understanding, all questions fully answered. Patient instructed to return to ED, call 911, or call MD for any changes in condition.  Patient escorted via [place], and D/C home via private auto.  Travis Durrett Loletha Grayer, RN 02/06/2021 11:06 AM

## 2021-02-06 NOTE — Discharge Summary (Signed)
Physician Discharge Summary  [name] EQA:[id number] DOB: 1935/04/23 DOA: 02/05/2021  PCP: Asencion Noble, MD  Admit date: 02/05/2021  Discharge date: 02/06/2021  Admitted From:Home  Disposition:  Home  Recommendations for Outpatient Follow-up:  Follow up with PCP in 1-2 weeks Continue on dexamethasone as prescribed for 5 more days Use inhaler as needed for shortness of breath or wheezing Counseled on smoking cessation extensively Follow-up outpatient with pulmonology with referral sent for COPD evaluation/management  Home Health: None  Equipment/Devices: None  Discharge Condition:Stable  CODE STATUS: Full  Diet recommendation: Heart Healthy  Brief/Interim Summary: Travis Murphy is a 86 y.o. male with medical history significant for chronic tobacco abuse, hypertension, dyslipidemia, and aortic aneurysm who presented to the urgent care on the day of admission due to cough and nasal congestion that has been ongoing for 1 week.  He was noted to be positive for COVID-pneumonia and was noted to have some mild hypoxemia as well.  He was sent to the ED for further evaluation and was requiring 2 L nasal cannula oxygen on account of his hypoxemia.  He had received remdesivir as well as dexamethasone with significant improvement overnight and has no further oxygen requirements noted.  He continues to smoke 1 pack/day and has been encouraged to quit.  Family members would like for him to follow-up with pulmonology in the near future for evaluation and management of COPD.  He will remain on dexamethasone as prescribed for several more days and has been given an inhaler as needed for any further shortness of breath or wheezing.  No other acute events noted during the course of this brief admission.  Discharge Diagnoses:  Active Problems:   Acute hypoxemic respiratory failure due to COVID-19 [facility])  Principal discharge diagnosis: Acute hypoxemic respiratory failure secondary to  COVID-19 pneumonia.  Discharge Instructions  Discharge Instructions     Ambulatory referral to Pulmonology   Complete by: As directed    Reason for referral: Asthma/COPD   Diet - low sodium heart healthy   Complete by: As directed    Increase activity slowly   Complete by: As directed       Allergies as of 02/06/2021   No Known Allergies      Medication List     STOP taking these medications    amoxicillin 500 MG tablet Commonly known as: AMOXIL       TAKE these medications    albuterol 108 (90 Base) MCG/ACT inhaler Commonly known as: VENTOLIN HFA Inhale 1-2 puffs into the lungs every 6 (six) hours as needed for wheezing or shortness of breath.   aspirin EC 81 MG tablet Take 81 mg by mouth daily.   atorvastatin 10 MG tablet Commonly known as: LIPITOR Take 10 mg by mouth daily.   dexamethasone 6 MG tablet Commonly known as: DECADRON Take 1 tablet (6 mg total) by mouth daily for 5 days.   guaiFENesin-dextromethorphan 100-10 MG/5ML syrup Commonly known as: ROBITUSSIN DM Take 10 mLs by mouth every 4 (four) hours as needed for cough.   meclizine 25 MG tablet Commonly known as: ANTIVERT Take 25 mg by mouth daily as needed for dizziness.   pantoprazole 40 MG tablet Commonly known as: PROTONIX Take 1 tablet (40 mg total) by mouth daily for 10 days. Start taking on: February 07, 2021   ramipril 10 MG capsule Commonly known as: ALTACE Take 10 mg by mouth daily.        Follow-up Information     Willey Blade,  Carloyn Manner, MD Follow up in 1 week(s).   Specialty: Internal Medicine Contact information: [address] [place] [address] [phone]                No Known Allergies  Consultations: None   Procedures/Studies: DG Chest 2 View  Result Date: 02/05/2021 CLINICAL DATA:  Nasal congestion and cough for the past week, low oxygen saturation EXAM: CHEST - 2 VIEW COMPARISON:  04/21/2018 FINDINGS: Normal heart size post TAVR. Mediastinal  contours and pulmonary vascularity normal. Atherosclerotic calcification aorta. Bronchitic changes with improved basilar atelectasis versus previous exam. Slight chronic accentuation of interstitial markings. No acute infiltrate, pleural effusion, or pneumothorax. Bones demineralized. IMPRESSION: Bronchitic changes without infiltrate. Electronically Signed   By: Lavonia Dana M.D.   On: 02/05/2021 10:42     Discharge Exam: Vitals:   02/05/21 2137 02/06/21 0607  BP: 134/70 (!) 88/77  Pulse: 60 60  Resp: 18 18  Temp: 98.3 F (36.8 C) 97.8 F (36.6 C)  SpO2: 97% 91%   Vitals:   02/05/21 1615 02/05/21 1800 02/05/21 2137 02/06/21 0607  BP:   134/70 (!) 88/77  Pulse: 60 68 60 60  Resp: 20  18 18   Temp:   98.3 F (36.8 C) 97.8 F (36.6 C)  TempSrc:   Oral   SpO2: 92% 95% 97% 91%  Weight:      Height:        General: Pt is alert, awake, not in acute distress Cardiovascular: RRR, S1/S2 +, no rubs, no gallops Respiratory: CTA bilaterally, no wheezing, no rhonchi Abdominal: Soft, NT, ND, bowel sounds + Extremities: no edema, no cyanosis    The results of significant diagnostics from this hospitalization (including imaging, microbiology, ancillary and laboratory) are listed below for reference.     Microbiology: Recent Results (from the past 240 hour(s))  Resp Panel by RT-PCR (Flu A&B, Covid) Nasopharyngeal Swab     Status: Abnormal   Collection Time: 02/05/21 11:55 AM   Specimen: Nasopharyngeal Swab; Nasopharyngeal(NP) swabs in vial transport medium  Result Value Ref Range Status   SARS Coronavirus 2 by RT PCR POSITIVE (A) NEGATIVE Final    Comment: RESULT CALLED TO, READ BACK BY AND VERIFIED WITH: DR. GOLDSTON@1335  BY MATTHEWS, B 6.13.22 (NOTE) SARS-CoV-2 target nucleic acids are DETECTED.  The SARS-CoV-2 RNA is generally detectable in upper respiratory specimens during the acute phase of infection. Positive results are indicative of the presence of the identified virus, but  do not rule out bacterial infection or co-infection with other pathogens not detected by the test. Clinical correlation with patient history and other diagnostic information is necessary to determine patient infection status. The expected result is Negative.  Fact Sheet for Patients: 06-06-2004  Fact Sheet for Healthcare Providers: EntrepreneurPulse.com.au  This test is not yet approved or cleared by the [employer] FDA and  has been authorized for detection and/or diagnosis of SARS-CoV-2 by FDA under an Emergency Use Authorization (EUA).  This EUA will remain in effect (meaning this test  can be used) for the duration of  the COVID-19 declaration under Section 564(b)(1) of the Act, 21 U.S.C. section 360bbb-3(b)(1), unless the authorization is terminated or revoked sooner.     Influenza A by PCR NEGATIVE NEGATIVE Final   Influenza B by PCR NEGATIVE NEGATIVE Final    Comment: (NOTE) The Xpert Xpress SARS-CoV-2/FLU/RSV plus assay is intended as an aid in the diagnosis of influenza from Nasopharyngeal swab specimens and should not be used as a sole  basis for treatment. Nasal washings and aspirates are unacceptable for Xpert Xpress SARS-CoV-2/FLU/RSV testing.  Fact Sheet for Patients: EntrepreneurPulse.com.au  Fact Sheet for Healthcare Providers: IncredibleEmployment.be  This test is not yet approved or cleared by the [place] FDA and has been authorized for detection and/or diagnosis of SARS-CoV-2 by FDA under an Emergency Use Authorization (EUA). This EUA will remain in effect (meaning this test can be used) for the duration of the COVID-19 declaration under Section 564(b)(1) of the Act, 21 U.S.C. section 360bbb-3(b)(1), unless the authorization is terminated or revoked.  Performed at [facility], [address], [place], [place] [address]   Blood Culture (routine x 2)     Status:  None (Preliminary result)   Collection Time: 02/05/21  2:40 PM   Specimen: BLOOD  Result Value Ref Range Status   Specimen Description BLOOD RIGHT ANTECUBITAL  Final   Special Requests   Final    Blood Culture results may not be optimal due to an excessive volume of blood received in culture bottles BOTTLES DRAWN AEROBIC AND ANAEROBIC   Culture   Final    NO GROWTH < 24 HOURS Performed at [facility], [address] [place], [place] [address]    Report Status PENDING  Incomplete  Blood Culture (routine x 2)     Status: None (Preliminary result)   Collection Time: 02/05/21  2:40 PM   Specimen: BLOOD  Result Value Ref Range Status   Specimen Description BLOOD BLOOD RIGHT HAND  Final   Special Requests   Final    Blood Culture adequate volume BOTTLES DRAWN AEROBIC AND ANAEROBIC   Culture   Final    NO GROWTH < 24 HOURS Performed at [facility], [address], [place], [place] [address]    Report Status PENDING  Incomplete     Labs: BNP (last 3 results) Recent Labs    02/05/21 1156  BNP 376.2*   Basic Metabolic Panel: Recent Labs  Lab 02/05/21 1155 02/06/21 0511  NA 138 138  K 3.9 5.0  CL 102 103  CO2 31 28  GLUCOSE 107* 101*  BUN 14 16  CREATININE 0.70 0.82  CALCIUM 7.9* 8.0*  MG  --  2.2   Liver Function Tests: Recent Labs  Lab 02/05/21 1155 02/06/21 0511  AST 39 35  ALT 33 33  ALKPHOS 80 75  BILITOT 0.9 1.0  PROT 6.7 6.1*  ALBUMIN 3.4* 3.1*   No results for input(s): LIPASE, AMYLASE in the last 168 hours. No results for input(s): AMMONIA in the last 168 hours. CBC: Recent Labs  Lab 02/05/21 1155 02/06/21 0511  WBC 3.3* 3.7*  NEUTROABS 1.8 2.2  HGB 14.6 14.3  HCT 44.4 43.3  MCV 97.8 98.2  PLT 90* 82*   Cardiac Enzymes: No results for input(s): CKTOTAL, CKMB, CKMBINDEX, TROPONINI in the last 168 hours. BNP: Invalid input(s): POCBNP CBG: No results for input(s): GLUCAP in the last 168 hours. D-Dimer Recent Labs    02/05/21 1155  02/06/21 0511  DDIMER 1.43* 1.37*   Hgb A1c No results for input(s): HGBA1C in the last 72 hours. Lipid Profile Recent Labs    02/05/21 1156  TRIG 62   Thyroid function studies Recent Labs    02/05/21 1155  TSH 1.851   Anemia work up Recent Labs    02/05/21 1156 02/06/21 0511  FERRITIN 254 267   Urinalysis    Component Value Date/Time   COLORURINE STRAW (A) 04/17/2018 [place] 04/17/2018  7628   LABSPEC 1.004 (L) 04/17/2018 0821   PHURINE 6.0 04/17/2018 0821   GLUCOSEU NEGATIVE 04/17/2018 0821   HGBUR NEGATIVE 04/17/2018 0821   BILIRUBINUR NEGATIVE 04/17/2018 0821   KETONESUR NEGATIVE 04/17/2018 0821   PROTEINUR NEGATIVE 04/17/2018 0821   NITRITE NEGATIVE 04/17/2018 0821   LEUKOCYTESUR TRACE (A) 04/17/2018 0821   Sepsis Labs Invalid input(s): PROCALCITONIN,  WBC,  LACTICIDVEN Microbiology Recent Results (from the past 240 hour(s))  Resp Panel by RT-PCR (Flu A&B, Covid) Nasopharyngeal Swab     Status: Abnormal   Collection Time: 02/05/21 11:55 AM   Specimen: Nasopharyngeal Swab; Nasopharyngeal(NP) swabs in vial transport medium  Result Value Ref Range Status   SARS Coronavirus 2 by RT PCR POSITIVE (A) NEGATIVE Final    Comment: RESULT CALLED TO, READ BACK BY AND VERIFIED WITH: DR. GOLDSTON@1335  BY MATTHEWS, B 6.13.22 (NOTE) SARS-CoV-2 target nucleic acids are DETECTED.  The SARS-CoV-2 RNA is generally detectable in upper respiratory specimens during the acute phase of infection. Positive results are indicative of the presence of the identified virus, but do not rule out bacterial infection or co-infection with other pathogens not detected by the test. Clinical correlation with patient history and other diagnostic information is necessary to determine patient infection status. The expected result is Negative.  Fact Sheet for Patients: 06-06-2004  Fact Sheet for Healthcare  Providers: EntrepreneurPulse.com.au  This test is not yet approved or cleared by the [employer] FDA and  has been authorized for detection and/or diagnosis of SARS-CoV-2 by FDA under an Emergency Use Authorization (EUA).  This EUA will remain in effect (meaning this test  can be used) for the duration of  the COVID-19 declaration under Section 564(b)(1) of the Act, 21 U.S.C. section 360bbb-3(b)(1), unless the authorization is terminated or revoked sooner.     Influenza A by PCR NEGATIVE NEGATIVE Final   Influenza B by PCR NEGATIVE NEGATIVE Final    Comment: (NOTE) The Xpert Xpress SARS-CoV-2/FLU/RSV plus assay is intended as an aid in the diagnosis of influenza from Nasopharyngeal swab specimens and should not be used as a sole basis for treatment. Nasal washings and aspirates are unacceptable for Xpert Xpress SARS-CoV-2/FLU/RSV testing.  Fact Sheet for Patients: [place]  Fact Sheet for Healthcare Providers: EntrepreneurPulse.com.au  This test is not yet approved or cleared by the [employer] FDA and has been authorized for detection and/or diagnosis of SARS-CoV-2 by FDA under an Emergency Use Authorization (EUA). This EUA will remain in effect (meaning this test can be used) for the duration of the COVID-19 declaration under Section 564(b)(1) of the Act, 21 U.S.C. section 360bbb-3(b)(1), unless the authorization is terminated or revoked.  Performed at [facility], [address] [place], [place]   Blood Culture (routine x 2)     Status: None (Preliminary result)   Collection Time: 02/05/21  2:40 PM   Specimen: BLOOD  Result Value Ref Range Status   Specimen Description BLOOD RIGHT ANTECUBITAL  Final   Special Requests   Final    Blood Culture results may not be optimal due to an excessive volume of blood received in culture bottles BOTTLES DRAWN AEROBIC AND ANAEROBIC   Culture   Final     NO GROWTH < 24 HOURS Performed at [facility], [address] [place], [place]    Report Status PENDING  Incomplete  Blood Culture (routine x 2)     Status: None (Preliminary result)   Collection Time: 02/05/21  2:40 PM  Specimen: BLOOD  Result Value Ref Range Status   Specimen Description BLOOD BLOOD RIGHT HAND  Final   Special Requests   Final    Blood Culture adequate volume BOTTLES DRAWN AEROBIC AND ANAEROBIC   Culture   Final    NO GROWTH < 24 HOURS Performed at [facility], [address] [place], [place] [address]    Report Status PENDING  Incomplete     Time coordinating discharge: 35 minutes  SIGNED:   Rodena Goldmann, DO [facility] 02/06/2021, 10:00 AM  If 7PM-7AM, please contact night-coverage [email or web address]

## 2021-02-06 NOTE — Care Management Obs Status (Signed)
[place] NOTIFICATION   Patient Details  Name: Travis Murphy MRN: [id number] Date of Birth: 1935-07-30   Medicare Observation Status Notification Given:  Yes    Boneta Lucks, RN 02/06/2021, 10:26 AM

## 2021-02-08 ENCOUNTER — Ambulatory Visit: Payer: Medicare Other | Admitting: Student

## 2021-02-10 LAB — CULTURE, BLOOD (ROUTINE X 2)
Culture: NO GROWTH
Culture: NO GROWTH
Special Requests: ADEQUATE

## 2021-02-22 DIAGNOSIS — E785 Hyperlipidemia, unspecified: Secondary | ICD-10-CM | POA: Diagnosis not present

## 2021-02-22 DIAGNOSIS — E1129 Type 2 diabetes mellitus with other diabetic kidney complication: Secondary | ICD-10-CM | POA: Diagnosis not present

## 2021-02-22 DIAGNOSIS — I10 Essential (primary) hypertension: Secondary | ICD-10-CM | POA: Diagnosis not present

## 2021-03-06 ENCOUNTER — Ambulatory Visit: Payer: Medicare Other | Admitting: Internal Medicine

## 2021-03-06 ENCOUNTER — Other Ambulatory Visit: Payer: Self-pay

## 2021-03-06 ENCOUNTER — Encounter: Payer: Self-pay | Admitting: Internal Medicine

## 2021-03-06 DIAGNOSIS — F17210 Nicotine dependence, cigarettes, uncomplicated: Secondary | ICD-10-CM | POA: Diagnosis not present

## 2021-03-06 DIAGNOSIS — R058 Other specified cough: Secondary | ICD-10-CM | POA: Diagnosis not present

## 2021-03-06 DIAGNOSIS — J449 Chronic obstructive pulmonary disease, unspecified: Secondary | ICD-10-CM | POA: Diagnosis not present

## 2021-03-06 DIAGNOSIS — I10 Essential (primary) hypertension: Secondary | ICD-10-CM

## 2021-03-06 MED ORDER — FAMOTIDINE 20 MG PO TABS: ORAL_TABLET | ORAL | 11 refills | Status: DC

## 2021-03-06 MED ORDER — IRBESARTAN 150 MG PO TABS: 150.0000 mg | ORAL_TABLET | Freq: Every day | ORAL | 11 refills | Status: DC

## 2021-03-06 MED ORDER — PANTOPRAZOLE SODIUM 40 MG PO TBEC: 40.0000 mg | DELAYED_RELEASE_TABLET | Freq: Every day | ORAL | 2 refills | Status: DC

## 2021-03-06 NOTE — Assessment & Plan Note (Signed)
Active smoker  PFT's  04/06/18  FEV1 1.55 (66 % ) ratio 0.59  p 3 % improvement from saba p 0 prior to study with DLCO  9.37 (34%) corrects to 2.70 (62%)  for alv volume and FV curve minimal concavity    > 3 min discussion I reviewed the Fletcher curve with the patient that basically indicates  if you quit smoking when your best day FEV1 is still relatively  preserved (as is still   the case here)  it is highly unlikely you will progress to severe disease and informed the patient there was  no medication on the market that has proven to alter the curve/ its downward trajectory  or the likelihood of progression of their disease(unlike other chronic medical conditions such as atheroclerosis where we do think we can change the natural hx with risk reducing meds)    Therefore stopping smoking and maintaining abstinence are  the most important aspects of his care, not choice of inhalers or for that matter, doctors.   Treatment other than smoking cessation  is entirely directed by severity of symptoms and focused also on reducing exacerbations, not attempting to change the natural history of the disease.   Since denies limiting doe and not having classic aecopd only rx is prn saba

## 2021-03-06 NOTE — Assessment & Plan Note (Signed)
D/c acei 03/06/2021 due to cough and pseudocopd flare   In the best review of chronic cough to date ( NEJM 2016 375 [id number]) ,  ACEi are now felt to cause cough in up to  20% of pts which is a 4 fold increase from previous reports and does not include the variety of non-specific complaints we see in pulmonary clinic in pts on ACEi but previously attributed to another dx like  Copd/asthma and  include PNDS, throat and chest congestion, "bronchitis", unexplained dyspnea and noct "strangling" sensations, and hoarseness, but also  atypical /refractory GERD symptoms like dysphagia and "bad heartburn"   The only way I know  to prove this is not an "ACEi Case" is a trial off ACEi x a minimum of 6 weeks then regroup.   >> try avapro 150 mg one half to one daily and f/u in 4 weeks

## 2021-03-06 NOTE — Patient Instructions (Addendum)
Stop ramipril and replace with ibesartan 150 one half daily to start with    Prednisone 10 mg take  4 each am x 2 days,   2 each am x 2 days,  1 each am x 2 days and stop   For cough > mucinex dm 1200 mg every 12 hours as needed   Pantoprazole (protonix) 40 mg   Take  30-60 min before first meal of the day and Pepcid (famotidine)  20 mg after supper until return to office - this is the best way to tell whether stomach acid is contributing to your problem.    GERD (REFLUX)  is an extremely common cause of respiratory symptoms just like yours , many times with no obvious heartburn at all.    It can be treated with medication, but also with lifestyle changes including elevation of the head of your bed (ideally with 6 -8inch blocks under the headboard of your bed),  Smoking cessation, avoidance of late meals, excessive alcohol, and avoid fatty foods, chocolate, peppermint, colas, red wine, and acidic juices such as orange juice.  NO MINT OR MENTHOL PRODUCTS SO NO COUGH DROPS  USE SUGARLESS CANDY INSTEAD (Jolley ranchers or Stover's or Life Savers) or even ice chips will also do - the key is to swallow to prevent all throat clearing. NO OIL BASED VITAMINS - use powdered substitutes.  Avoid fish oil when coughing.    The key is to stop smoking completely before smoking completely stops you!    Please schedule a follow up office visit in 4 weeks, sooner if needed

## 2021-03-06 NOTE — Progress Notes (Signed)
Brand Males, male    DOB: 04-13-1935,    MRN: [id number]   Brief patient profile:  67 yowm active smoker with GOLD 2 critieria for copd 04/06/2018     Admit date: 02/05/2021 Discharge date: 02/06/2021 Recommendations for Outpatient Follow-up:  Follow up with PCP in 1-2 weeks Continue on dexamethasone as prescribed for 5 more days Use inhaler as needed for shortness of breath or wheezing Counseled on smoking cessation extensively Follow-up outpatient with pulmonology with referral sent for COPD evaluation/management   Brief/Interim Summary: Travis Murphy is a 86 y.o. male with medical history significant for chronic tobacco abuse, hypertension, dyslipidemia, and aortic aneurysm who presented to the urgent care on the day of admission due to cough and nasal congestion that has been ongoing for 1 week.  He was noted to be positive for COVID-pneumonia and was noted to have some mild hypoxemia as well.  He was sent to the ED for further evaluation and was requiring 2 L nasal cannula oxygen on account of his hypoxemia.  He had received remdesivir as well as dexamethasone with significant improvement overnight and has no further oxygen requirements noted.  He continues to smoke 1 pack/day and has been encouraged to quit.  Family members would like for him to follow-up with pulmonology in the near future for evaluation and management of COPD.  He will remain on dexamethasone as prescribed for several more days and has been given an inhaler as needed for any further shortness of breath or wheezing.  No other acute events noted during the course of this brief admission.   Discharge Diagnoses:  Active Problems:   Acute hypoxemic respiratory failure due to COVID-19 [facility])   Principal discharge diagnosis: Acute hypoxemic respiratory failure secondary to COVID-19 pneumonia.       History of Present Illness  03/06/2021  Pulmonary/ 1st office eval/ Kazuki Ingle / [place] [facility] re post hosp  for covid/ resp failure  GOLD 2 criteria for copd as of 03/2018  Chief Complaint  Patient presents with   Pulmonary Consult    Referred by Dr. Manuella Ghazi. Pt c/o cough since June 2022. Cough is non prod with no specific trigger.   Dyspnea:  denies limiting doe  Cough: since covid non prod daytime, worse with voice use  Sleep: up 30 degrees x years  SABA use: none Covid vax:  x 2 plus likely omicron June 2022   No obvious day to day or daytime variability or assoc excess/ purulent sputum or mucus plugs or hemoptysis or cp or chest tightness, subjective wheeze or overt sinus or hb symptoms.   Sleeping  without nocturnal  or early am exacerbation  of respiratory  c/o's or need for noct saba. Also denies any obvious fluctuation of symptoms with weather or environmental changes or other aggravating or alleviating factors except as outlined above   No unusual exposure hx or h/o childhood pna/ asthma or knowledge of premature birth.  Current Allergies, Complete Past Medical History, Past Surgical History, Family History, and Social History were reviewed in [employer] record.  ROS  The following are not active complaints unless bolded Hoarseness, sore throat, dysphagia, dental problems, itching, sneezing,  nasal congestion or discharge of excess mucus or purulent secretions, ear ache,   fever, chills, sweats, unintended wt loss or wt gain, classically pleuritic or exertional cp,  orthopnea pnd or arm/hand swelling  or leg swelling, presyncope, palpitations, abdominal pain, anorexia, nausea, vomiting, diarrhea  or change in bowel habits  or change in bladder habits, change in stools or change in urine, dysuria, hematuria,  rash, arthralgias, visual complaints, headache, numbness, weakness or ataxia or problems with walking or coordination,  change in mood or  memory.           Past Medical History:  Diagnosis Date   Arthritis    Essential hypertension    Hyperlipemia     Hyperplastic colon polyp 2002   Dr. Arnoldo Morale   Pulmonary nodule    a. right lower lobe. 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 severe AS by echo in 2017 b. 01/2018: repeat echo showing EF of 55-60%, Grade 2 DD, and severe AS with mean gradient of 58 mm Hg   Vertigo     Outpatient Medications Prior to Visit  Medication Sig Dispense Refill   aspirin EC 81 MG tablet Take 81 mg by mouth daily.     atorvastatin (LIPITOR) 10 MG tablet Take 10 mg by mouth daily.  4   ramipril (ALTACE) 10 MG capsule Take 10 mg by mouth daily.     albuterol (VENTOLIN HFA) 108 (90 Base) MCG/ACT inhaler Inhale 1-2 puffs into the lungs every 6 (six) hours as needed for wheezing or shortness of breath. 8 g 1   guaiFENesin-dextromethorphan (ROBITUSSIN DM) 100-10 MG/5ML syrup Take 10 mLs by mouth every 4 (four) hours as needed for cough. 118 mL 0   meclizine (ANTIVERT) 25 MG tablet Take 25 mg by mouth daily as needed for dizziness.     pantoprazole (PROTONIX) 40 MG tablet Take 1 tablet (40 mg total) by mouth daily for 10 days. 10 tablet 0   No facility-administered medications prior to visit.     Objective:     Pulse 73   Temp 98.7 F (37.1 C) (Oral)   Ht 5\' 6"  (1.676 m)   Wt 132 lb (59.9 kg)   SpO2 94% Comment: on RA  BMI 21.31 kg/m   SpO2: 94 % (on RA)   HEENT : pt wearing mask not removed for exam due to covid - 19 concerns.    NECK :  without JVD/Nodes/TM/ nl carotid upstrokes bilaterally   LUNGS: no acc muscle use,  Mild barrel  contour chest wall with bilateral  Distant bs s audible wheeze and  without cough on insp or exp maneuvers  and mild  Hyperresonant  to  percussion bilaterally     CV:  RRR  no s3 or murmur or increase in P2, and no edema   ABD:  soft and nontender with pos end   insp Hoover's  in the supine position. No bruits or organomegaly appreciated, bowel sounds nl  MS:   Nl gait/  ext warm without deformities, calf tenderness, cyanosis or clubbing No obvious joint restrictions   SKIN: warm and dry without lesions    NEURO:  alert, approp, nl sensorium with  no motor or cerebellar deficits apparent.         I personally reviewed images and agree with radiology impression as follows:  CXR:   02/05/21  Clear       Assessment   No problem-specific Assessment & Plan notes found for this encounter.     Christinia Gully, MD 03/06/2021

## 2021-03-06 NOTE — Assessment & Plan Note (Signed)
Flared with covid 01/2021  - try off acei and on max gerd rx 03/06/2021 >>>   Cough that is dry and not exac in am is not c/w CB from smoking but more typical of Upper airway cough syndrome (previously labeled PNDS),  is so named because it's frequently impossible to sort out how much is  CR/sinusitis with freq throat clearing (which can be related to primary GERD)   vs  causing  secondary (" extra esophageal")  GERD from wide swings in gastric pressure that occur with throat clearing, often  promoting self use of mint and menthol lozenges that reduce the lower esophageal sphincter tone and exacerbate the problem further in a cyclical fashion.   These are the same pts (now being labeled as having "irritable larynx syndrome" by some cough centers) who not infrequently have a history of having failed to tolerate ace inhibitors,  dry powder inhalers or biphosphonates or report having atypical/extraesophageal reflux symptoms that don't respond to standard doses of PPI  and are easily confused as having aecopd or asthma flares by even experienced allergists/ pulmonologists (myself included).   >>> try off acei and on gerd rx and return in 4 weeks to regroup

## 2021-03-06 NOTE — Assessment & Plan Note (Signed)
Counseled re importance of smoking cessation but did not meet time criteria for separate billing     I had an extended discussion with the patient and reviewed all relevant studies so total time was 30 minutes with moderate level of MDM.  Each maintenance medication was reviewed in detail including most importantly the difference between maintenance and prns and under what circumstances the prns are to be triggered using an action plan format that is not reflected in the computer generated alphabetically organized AVS.     Please see AVS for specific instructions unique to this visit that I personally wrote and verbalized to the the pt in detail and then reviewed with pt  by my nurse highlighting any  changes in therapy recommended at today's visit to their plan of care.

## 2021-03-20 ENCOUNTER — Ambulatory Visit: Payer: Medicare Other | Admitting: Cardiology

## 2021-04-06 ENCOUNTER — Ambulatory Visit: Payer: Medicare Other | Admitting: Internal Medicine

## 2021-04-06 ENCOUNTER — Encounter: Payer: Self-pay | Admitting: Internal Medicine

## 2021-04-06 ENCOUNTER — Other Ambulatory Visit: Payer: Self-pay

## 2021-04-06 DIAGNOSIS — F17210 Nicotine dependence, cigarettes, uncomplicated: Secondary | ICD-10-CM | POA: Diagnosis not present

## 2021-04-06 DIAGNOSIS — I10 Essential (primary) hypertension: Secondary | ICD-10-CM

## 2021-04-06 DIAGNOSIS — R058 Other specified cough: Secondary | ICD-10-CM

## 2021-04-06 DIAGNOSIS — J449 Chronic obstructive pulmonary disease, unspecified: Secondary | ICD-10-CM | POA: Diagnosis not present

## 2021-04-06 NOTE — Assessment & Plan Note (Signed)
Counseled re importance of smoking cessation but did not meet time criteria for separate billing            Each maintenance medication was reviewed in detail including emphasizing most importantly the difference between maintenance and prns and under what circumstances the prns are to be triggered using an action plan format where appropriate.  Total time for H and P, chart review, counseling,  directly observing portions of ambulatory 02 saturation study/ and generating customized AVS unique to this final summary  office visit / same day charting  > 30 min

## 2021-04-06 NOTE — Assessment & Plan Note (Signed)
D/c acei 03/06/2021 due to cough and pseudocopd flare> resolved as of 04/06/2021   Although even in retrospect it may not be clear the ACEi contributed to the pt's symptoms,  Pt improved off them and adding them back at this point or in the future would risk confusion in interpretation of non-specific respiratory symptoms to which this patient is prone  ie  Better not to muddy the waters here.   >>> bp well controlled on avapro 150, f/u pcp

## 2021-04-06 NOTE — Assessment & Plan Note (Signed)
Flared with covid 01/2021  - try off acei and on max gerd rx 03/06/2021 >>> resolved as of 04/06/2021 so rec taper off acid suppression, gi eval if flares   Hopefully will not need additional w/u if stays off acei and observes gerd diet.

## 2021-04-06 NOTE — Progress Notes (Signed)
Travis Murphy, male    DOB: 1935-08-17     MRN: [id number]   Brief patient profile:  65 yowm active smoker with GOLD 2 critieria for copd 04/06/2018     Admit date: 02/05/2021 Discharge date: 02/06/2021 Recommendations for Outpatient Follow-up:  Follow up with PCP in 1-2 weeks Continue on dexamethasone as prescribed for 5 more days Use inhaler as needed for shortness of breath or wheezing Counseled on smoking cessation extensively Follow-up outpatient with pulmonology with referral sent for COPD evaluation/management   Brief/Interim Summary: Travis Murphy is a 86 y.o. male with medical history significant for chronic tobacco abuse, hypertension, dyslipidemia, and aortic aneurysm who presented to the urgent care on the day of admission due to cough and nasal congestion that has been ongoing for 1 week.  He was noted to be positive for COVID-pneumonia and was noted to have some mild hypoxemia as well.  He was sent to the ED for further evaluation and was requiring 2 L nasal cannula oxygen on account of his hypoxemia.  He had received remdesivir as well as dexamethasone with significant improvement overnight and has no further oxygen requirements noted.  He continues to smoke 1 pack/day and has been encouraged to quit.  Family members would like for him to follow-up with pulmonology in the near future for evaluation and management of COPD.  He will remain on dexamethasone as prescribed for several more days and has been given an inhaler as needed for any further shortness of breath or wheezing.  No other acute events noted during the course of this brief admission.   Discharge Diagnoses:  Active Problems:   Acute hypoxemic respiratory failure due to COVID-19 [facility])   Principal discharge diagnosis: Acute hypoxemic respiratory failure secondary to COVID-19 pneumonia.       History of Present Illness  03/06/2021  Pulmonary/ 1st office eval/ Trice Aspinall / [place] [facility] re post hosp  for covid/ resp failure  GOLD 2 criteria for copd as of 03/2018  Chief Complaint  Patient presents with   Pulmonary Consult    Referred by Dr. Manuella Ghazi. Pt c/o cough since June 2022. Cough is non prod with no specific trigger.   Dyspnea:  denies limiting doe  Cough: since covid non prod daytime, worse with voice use  Sleep: up 30 degrees x years  SABA use: none Covid vax:  x 2 plus likely omicron June 2022  Rec Stop ramipril and replace with ibesartan 150 one half daily to start with   Prednisone 10 mg take  4 each am x 2 days,   2 each am x 2 days,  1 each am x 2 days and stop  For cough > mucinex dm 1200 mg every 12 hours as needed  Pantoprazole (protonix) 40 mg   Take  30-60 min before first meal of the day and Pepcid (famotidine)  20 mg after supper until return to office -  The key is to stop smoking completely before smoking completely stops you! GERD diet reviewed, bed blocks rec        04/06/2021  f/u ov/[place] [facility]/Jenayah Antu re: GOLD 2 copd, post covid cough/  still smoking  Chief Complaint  Patient presents with   Follow-up    Breathing is doing well. His cough has resolved.   Dyspnea:  Not limited by breathing from desired activities   Cough: no cough and no need for cough medication  Sleeping: bed is flat / 2 pillows  SABA use: none  02:  none  Covid status: vax x 2 and infected with omicron June 2022      No obvious day to day or daytime variability or assoc excess/ purulent sputum or mucus plugs or hemoptysis or cp or chest tightness, subjective wheeze or overt sinus or hb symptoms.   Sleeping  without nocturnal  or early am exacerbation  of respiratory  c/o's or need for noct saba. Also denies any obvious fluctuation of symptoms with weather or environmental changes or other aggravating or alleviating factors except as outlined above   No unusual exposure hx or h/o childhood pna/ asthma or knowledge of premature birth.  Current Allergies, Complete Past Medical  History, Past Surgical History, Family History, and Social History were reviewed in [employer] record.  ROS  The following are not active complaints unless bolded Hoarseness, sore throat, dysphagia, dental problems, itching, sneezing,  nasal congestion or discharge of excess mucus or purulent secretions, ear ache,   fever, chills, sweats, unintended wt loss or wt gain, classically pleuritic or exertional cp,  orthopnea pnd or arm/hand swelling  or leg swelling, presyncope, palpitations, abdominal pain, anorexia, nausea, vomiting, diarrhea  or change in bowel habits or change in bladder habits, change in stools or change in urine, dysuria, hematuria,  rash, arthralgias, visual complaints, headache, numbness, weakness or ataxia or problems with walking or coordination,  change in mood or  memory.        Current Meds  Medication Sig   aspirin EC 81 MG tablet Take 81 mg by mouth daily.   atorvastatin (LIPITOR) 10 MG tablet Take 10 mg by mouth daily.   famotidine (PEPCID) 20 MG tablet One after supper   irbesartan (AVAPRO) 150 MG tablet Take 1 tablet (150 mg total) by mouth daily.   pantoprazole (PROTONIX) 40 MG tablet Take 1 tablet (40 mg total) by mouth daily. Take 30-60 min before first meal of the day                Past Medical History:  Diagnosis Date   Arthritis    Essential hypertension    Hyperlipemia    Hyperplastic colon polyp 2002   Dr. Arnoldo Morale   Pulmonary nodule    a. right lower lobe. 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 severe AS by echo in 2017 b. 01/2018: repeat echo showing EF of 55-60%, Grade 2 DD, and severe AS with mean gradient of 58 mm Hg   Vertigo        Objective:       Wt Readings from Last 3 Encounters:  03/06/21 132 lb  (59.9 kg)  02/05/21 135 lb (61.2 kg)  12/26/20 135 lb (61.2 kg)      Vital signs reviewed  04/06/2021  - Note at rest 02 sats  95% on RA   General appearance:    pleasant wm nad       HEENT : pt wearing mask not removed for exam due to covid - 19 concerns.   NECK :  without JVD/Nodes/TM/ nl carotid upstrokes bilaterally   LUNGS: no acc muscle use,  Min barrel  contour chest wall with bilateral  distant insp exp rhonchi and  without cough on insp or exp maneuvers and min  Hyperresonant  to  percussion bilaterally     CV:  RRR  no s3 or murmur or increase in P2, and no edema   ABD:  soft and nontender with pos end  insp Hoover's  in the supine position. No bruits or organomegaly appreciated, bowel sounds nl  MS:   Nl gait/  ext warm without deformities, calf tenderness, cyanosis or clubbing No obvious joint restrictions   SKIN: warm and dry without lesions    NEURO:  alert, approp, nl sensorium with  no motor or cerebellar deficits apparent.              Assessment

## 2021-04-06 NOTE — Patient Instructions (Addendum)
Ok to stop pantaprazole and just take pepcid 20 mg after bfast and supper until you use them up and then don't refill them and if cough flares it would indicate you have an acid problem and should resume the original plan (pantoprazole before bfast and pepcid 20 mg after supper and check with Dr Willey Blade  The key is to stop smoking completely before smoking completely stops you!   If you are satisfied with your treatment plan,  let your doctor know and he/she can either refill your medications or you can return here when your prescription runs out.     If in any way you are not 100% satisfied,  please tell us.  If 100% better, tell your friends!  Pulmonary follow up is as needed

## 2021-04-06 NOTE — Assessment & Plan Note (Addendum)
Active smoker  PFT's  04/06/18  FEV1 1.55 (66 % ) ratio 0.59  p 3 % improvement from saba p 0 prior to study with DLCO  9.37 (34%) corrects to 2.70 (62%)  for alv volume and FV curve minimal concavity   - 04/06/2021   Walked on RA x  3  lap(s) =  approx 150 each  @ nl pace, stopped due to end of study, no sob  with lowest 02 sats 92%   No recs other than to d/c cigs and f/u here prn

## 2021-05-21 ENCOUNTER — Other Ambulatory Visit: Payer: Self-pay

## 2021-05-21 ENCOUNTER — Ambulatory Visit (HOSPITAL_COMMUNITY)
Admission: RE | Admit: 2021-05-21 | Discharge: 2021-05-21 | Disposition: A | Discharge status: Home / Self Care | Payer: Medicare Other | Source: Ambulatory Visit | Attending: Student | Admitting: Student

## 2021-05-21 DIAGNOSIS — I350 Nonrheumatic aortic (valve) stenosis: Secondary | ICD-10-CM | POA: Insufficient documentation

## 2021-05-21 DIAGNOSIS — Z952 Presence of prosthetic heart valve: Secondary | ICD-10-CM | POA: Diagnosis not present

## 2021-05-21 LAB — ECHOCARDIOGRAM COMPLETE
AR max vel: 1.34 cm2
AV Area VTI: 1.67 cm2
AV Area mean vel: 1.2 cm2
AV Mean grad: 12 mmHg
AV Peak grad: 22.3 mmHg
Ao pk vel: 2.36 m/s
Area-P 1/2: 1.91 cm2
P 1/2 time: 404 msec
S' Lateral: 2.7 cm

## 2021-05-21 NOTE — Progress Notes (Signed)
*  PRELIMINARY RESULTS* Echocardiogram 2D Echocardiogram has been performed.  Travis Murphy, Human 05/21/2021, 10:24 AM

## 2021-05-22 ENCOUNTER — Telehealth: Payer: Self-pay

## 2021-05-22 NOTE — Telephone Encounter (Signed)
-----   Message from Erma Heritage, [place] sent at 05/21/2021  2:35 PM EDT ----- Please let the patient know his echocardiogram overall shows stable findings when compared to prior imaging. His ejection fraction remains normal at 60-65%. He does have mildly abnormal relaxation of the heart and good blood pressure control is essential. He does have mild to moderate regurgitation and paravalvular regurgitation along his TAVR which is similar to prior imaging with no significant change. Would anticipate repeat imaging again in 6 months.

## 2021-05-22 NOTE — Telephone Encounter (Signed)
Daughter (Per DPR) was notified of pt's results. Pt's daughter verbalized understand and scheduled pt's follow up appt for 11/10 with B. Ahmed Prima, PA-C

## 2021-05-24 ENCOUNTER — Other Ambulatory Visit: Payer: Self-pay

## 2021-05-24 ENCOUNTER — Ambulatory Visit
Admission: EM | Admit: 2021-05-24 | Discharge: 2021-05-24 | Disposition: A | Discharge status: Home / Self Care | Payer: Medicare Other | Attending: Emergency Medicine | Admitting: Emergency Medicine

## 2021-05-24 DIAGNOSIS — J209 Acute bronchitis, unspecified: Secondary | ICD-10-CM

## 2021-05-24 MED ORDER — PREDNISONE 10 MG PO TABS: 20.0000 mg | ORAL_TABLET | Freq: Every day | ORAL | 0 refills | Status: DC

## 2021-05-24 MED ORDER — AZITHROMYCIN 250 MG PO TABS: 250.0000 mg | ORAL_TABLET | Freq: Every day | ORAL | 0 refills | Status: DC

## 2021-05-24 MED ORDER — ALBUTEROL SULFATE HFA 108 (90 BASE) MCG/ACT IN AERS: 1.0000 | INHALATION_SPRAY | Freq: Four times a day (QID) | RESPIRATORY_TRACT | 0 refills | Status: DC | PRN

## 2021-05-24 NOTE — ED Triage Notes (Signed)
3 day h/o cough and congestion. No n/v/d. No meds taken. Pt is covid vaccinated but not boosted.

## 2021-05-24 NOTE — ED Provider Notes (Signed)
[facility]    CSN: [id number] Arrival date & time: 05/24/21  0932      History   Chief Complaint Chief Complaint  Patient presents with   Cough   Nasal Congestion    HPI Travis Murphy is a 86 y.o. male.   Pt. C/o cough and sneezing for one week. He has not taken anything for cough. Cough is productive with yellow sputum. No fever,chills,sob,n/v/d.   Cough  Past Medical History:  Diagnosis Date   Arthritis    Essential hypertension    Hyperlipemia    Hyperplastic colon polyp 2002   Dr. Arnoldo Morale   Pulmonary nodule    a. right lower lobe. 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 severe AS by echo in 2017 b. 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   Hyperlipemia    Cigarette smoker    Incidental pulmonary nodule, greater than or equal to 11mm 04/06/2018   Severe aortic valve stenosis    Essential hypertension    History of colon polyps 07/15/2011    Past Surgical History:  Procedure Laterality Date   CATARACT EXTRACTION Right    COLONOSCOPY  08/17/2001   Small polyp measured 3 mm removed/multiple middle scattered diverticula in the ascending colon-hyperplastic   COLONOSCOPY  08/14/2011   Procedure: COLONOSCOPY;  Surgeon: Daneil Dolin, MD;  [facility];  Service: Endoscopy;  Laterality: N/A;  10:00   EXCISIONAL HEMORRHOIDECTOMY     INTRAOPERATIVE TRANSTHORACIC  ECHOCARDIOGRAM N/A 04/21/2018   Procedure: INTRAOPERATIVE TRANSTHORACIC ECHOCARDIOGRAM;  Surgeon: Burnell Blanks, MD;  Location: [place];  Service: Open Heart Surgery;  Laterality: N/A;   RIGHT/LEFT HEART CATH AND CORONARY ANGIOGRAPHY N/A 03/25/2018   Procedure: RIGHT/LEFT HEART CATH AND CORONARY ANGIOGRAPHY;  Surgeon: Burnell Blanks, MD;  Location: [facility] [facility];  Service: Cardiovascular;  Laterality: N/A;   TRANSCATHETER AORTIC VALVE REPLACEMENT, TRANSFEMORAL  04/21/2018   TRANSCATHETER AORTIC VALVE REPLACEMENT, TRANSFEMORAL N/A 04/21/2018   Procedure: TRANSCATHETER AORTIC VALVE REPLACEMENT, TRANSFEMORAL;  Surgeon: Burnell Blanks, MD;  Location: [place];  Service: Open Heart Surgery;  Laterality: N/A;       Home Medications    Prior to Admission medications   Medication Sig Start Date End Date Taking? Authorizing Provider  aspirin EC 81 MG tablet Take 81 mg by mouth daily.    [provider]  atorvastatin (LIPITOR) 10 MG tablet Take 10 mg by mouth daily. 11/15/15   [provider]  famotidine (PEPCID) 20 MG tablet One after supper 03/06/21   Tanda Rockers, MD  irbesartan (AVAPRO) 150 MG tablet Take 1 tablet (150 mg total) by mouth daily. 03/06/21   Tanda Rockers, MD  pantoprazole (PROTONIX) 40 MG tablet Take 1 tablet (40 mg total) by mouth daily. Take 30-60 min before first meal of the day 03/06/21   Wert,  Christena Deem, MD    Family History Family History  Problem Relation Age of Onset   Stroke Mother    Heart attack Father    Liver disease Sister    Renal Disease Brother     Social History Social History   Tobacco Use   Smoking status: Every Day    Packs/day: 1.00    Years: 65.00    Pack years: 65.00    Types: Cigarettes   Smokeless tobacco: Never  Vaping Use   Vaping Use: Never used  Substance Use Topics   Alcohol use: Not Currently    Alcohol/week: 0.0 standard drinks   Drug use: Never     Allergies   Patient has no  known allergies.   Review of Systems Review of Systems  Constitutional: Negative.   HENT:  Positive for congestion and sneezing.   Respiratory:  Positive for cough.   Cardiovascular: Negative.   Gastrointestinal: Negative.   Genitourinary: Negative.     Physical Exam Triage Vital Signs ED Triage Vitals  Enc Vitals Group     BP 05/24/21 1013 111/67     Pulse Rate 05/24/21 1013 62     Resp 05/24/21 1013 18     Temp 05/24/21 1013 98.5 F (36.9 C)     Temp Source 05/24/21 1013 Oral     SpO2 05/24/21 1013 90 %     Weight --      Height --      Head Circumference --      Peak Flow --      Pain Score 05/24/21 1015 0     Pain Loc --      Pain Edu? --      Excl. in [place]? --    No data found.  Updated Vital Signs BP 111/67 (BP Location: Right Arm)   Pulse 62   Temp 98.5 F (36.9 C) (Oral)   Resp 18   SpO2 90%   Visual Acuity Right Eye Distance:   Left Eye Distance:   Bilateral Distance:    Right Eye Near:   Left Eye Near:    Bilateral Near:     Physical Exam Constitutional:      General: He is not in acute distress.    Appearance: He is normal weight. He is not ill-appearing, toxic-appearing or diaphoretic.  HENT:     Right Ear: Tympanic membrane and ear canal normal. There is no impacted cerumen.     Left Ear: Tympanic membrane and ear canal normal. There is no impacted cerumen.     Nose: Congestion present.     Mouth/Throat:     Mouth: Mucous membranes are moist.     Pharynx: No oropharyngeal exudate.  Eyes:     General: No scleral icterus.       Right eye: No discharge.        Left eye: No discharge.     Pupils: Pupils are equal, round, and reactive to light.  Cardiovascular:     Rate and Rhythm: Normal rate.     Heart sounds: No murmur heard.   No friction rub. No gallop.  Pulmonary:     Effort: Pulmonary effort is normal.     Breath sounds: Wheezing present.  Abdominal:     General: Abdomen is flat. Bowel sounds are normal. There is no distension.      Palpations: There is no mass.     Tenderness: There is no abdominal tenderness. There is no right CVA tenderness, left  CVA tenderness, guarding or rebound.     Hernia: No hernia is present.  Musculoskeletal:        General: No swelling, tenderness, deformity or signs of injury. Normal range of motion.     Right lower leg: No edema.     Left lower leg: No edema.  Skin:    General: Skin is warm and dry.     Coloration: Skin is not jaundiced or pale.     Findings: No bruising, erythema, lesion or rash.  Neurological:     Mental Status: He is alert.     UC Treatments / Results  Labs (all labs ordered are listed, but only abnormal results are displayed) Labs Reviewed - No data to display  EKG   Radiology No results found.  Procedures Procedures (including critical care time)  Medications Ordered in UC Medications - No data to display  Initial Impression / Assessment and Plan / UC Course  I have reviewed the triage vital signs and the nursing notes.  Pertinent labs & imaging results that were available during my care of the patient were reviewed by me and considered in my medical decision making (see chart for details).      Final Clinical Impressions(s) / UC Diagnoses   Final diagnoses:  None   Discharge Instructions   None    ED Prescriptions   None    PDMP not reviewed this encounter.   Lizabeth Leyden [place], [place] 05/24/21 1025

## 2021-05-25 DIAGNOSIS — I10 Essential (primary) hypertension: Secondary | ICD-10-CM | POA: Diagnosis not present

## 2021-05-25 DIAGNOSIS — E1129 Type 2 diabetes mellitus with other diabetic kidney complication: Secondary | ICD-10-CM | POA: Diagnosis not present

## 2021-05-25 DIAGNOSIS — E785 Hyperlipidemia, unspecified: Secondary | ICD-10-CM | POA: Diagnosis not present

## 2021-06-01 ENCOUNTER — Other Ambulatory Visit: Payer: Self-pay | Admitting: Internal Medicine

## 2021-07-05 ENCOUNTER — Ambulatory Visit: Payer: Medicare Other | Admitting: Student

## 2021-07-05 ENCOUNTER — Encounter: Payer: Self-pay | Admitting: Student

## 2021-07-05 VITALS — BP 138/74 | HR 68 | Ht 66.0 in | Wt 135.0 lb

## 2021-07-05 DIAGNOSIS — I10 Essential (primary) hypertension: Secondary | ICD-10-CM | POA: Diagnosis not present

## 2021-07-05 DIAGNOSIS — I2510 Atherosclerotic heart disease of native coronary artery without angina pectoris: Secondary | ICD-10-CM | POA: Diagnosis not present

## 2021-07-05 DIAGNOSIS — I350 Nonrheumatic aortic (valve) stenosis: Secondary | ICD-10-CM

## 2021-07-05 DIAGNOSIS — I7121 Aneurysm of the ascending aorta, without rupture: Secondary | ICD-10-CM | POA: Diagnosis not present

## 2021-07-05 NOTE — Patient Instructions (Signed)
Medication Instructions:   Continue current medication regimen.   Labwork:  None.   Testing/Procedures:  CT Scan of your Aorta to make sure this has remained stable.   Follow-Up:  With Dr. Domenic Polite in 6 months.   Any Other Special Instructions Will Be Listed Below (If Applicable).     If you need a refill on your cardiac medications before your next appointment, please call your pharmacy.

## 2021-07-05 NOTE — Progress Notes (Signed)
Cardiology Office Note    Date:  07/05/2021   ID:  Travis Murphy, Travis Murphy 12-05-1934, MRN [id number]  PCP:  Asencion Noble, MD  Cardiologist: Rozann Lesches, MD    Chief Complaint  Patient presents with   Follow-up    6 month visit    History of Present Illness:    Travis Murphy is a 86 y.o. male  with past medical history of severe AS (s/p TAVR in 03/2018), CAD (mild nonobstructive disease by cath in 02/2018), HTN, HLD, ascending thoracic aortic aneurysm (at 42 mm by CT in 07/2020) and prior tobacco use who presents to the office today for 6-month follow-up.  He was last examined by myself in 12/2020 and remained active at baseline and denied any recent anginal symptoms.  He was continued on his current medication regimen with plans for a repeat echocardiogram in 6 months given perivalvular regurgitation by most recent echocardiogram. He did have his repeat echocardiogram in 04/2021 and his ejection fraction remained preserved at 60 to 65% and he did have mild to moderate perivalvular regurgitation along his TAVR which was similar to prior imaging with repeat imaging recommended in 6 months.  In talking with the patient today, he reports overall doing well since his last office visit. He does remain active at baseline for his age and enjoys doing yard work and has most recently been raking leaves. He denies any recent chest pain or dyspnea on exertion. No recent palpitations, orthopnea, PND or lower extremity edema.  Past Medical History:  Diagnosis Date   Arthritis    Essential hypertension    Hyperlipemia    Hyperplastic colon polyp 2002   Dr. Arnoldo Morale   Pulmonary nodule    a. right lower lobe. 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 severe AS by echo in 2017 b.  01/2018: repeat echo showing EF of 55-60%, Grade 2 DD, and severe AS with mean gradient of 58 mm Hg   Vertigo     Past Surgical History:  Procedure Laterality Date   CATARACT EXTRACTION Right    COLONOSCOPY  08/17/2001   Small polyp measured 3 mm removed/multiple middle scattered diverticula in the ascending colon-hyperplastic   COLONOSCOPY  08/14/2011   Procedure: COLONOSCOPY;  Surgeon: Daneil Dolin, MD;  [facility];  Service: Endoscopy;  Laterality: N/A;  10:00   EXCISIONAL HEMORRHOIDECTOMY     INTRAOPERATIVE TRANSTHORACIC ECHOCARDIOGRAM N/A 04/21/2018   Procedure: INTRAOPERATIVE TRANSTHORACIC ECHOCARDIOGRAM;  Surgeon: Burnell Blanks, MD;  Location: [place];  Service: Open Heart Surgery;  Laterality: N/A;   RIGHT/LEFT HEART CATH AND CORONARY ANGIOGRAPHY N/A 03/25/2018   Procedure: RIGHT/LEFT HEART CATH AND CORONARY ANGIOGRAPHY;  Surgeon: Burnell Blanks, MD;  Location: [facility];  Service: Cardiovascular;  Laterality: N/A;   TRANSCATHETER AORTIC VALVE REPLACEMENT, TRANSFEMORAL  04/21/2018   TRANSCATHETER AORTIC VALVE REPLACEMENT, TRANSFEMORAL N/A 04/21/2018   Procedure: TRANSCATHETER AORTIC VALVE REPLACEMENT, TRANSFEMORAL;  Surgeon: Burnell Blanks, MD;  Location: [facility];  Service: Open Heart Surgery;  Laterality: N/A;    Current Medications: Outpatient Medications Prior to Visit  Medication Sig Dispense Refill   albuterol (VENTOLIN HFA) 108 (90 Base) MCG/ACT inhaler Inhale 1-2 puffs into the lungs every 6 (six) hours as needed for wheezing or  shortness of breath. 18 g 0   aspirin EC 81 MG tablet Take 81 mg by mouth daily.     atorvastatin (LIPITOR) 10 MG tablet Take 10 mg by mouth daily.  4   famotidine (PEPCID) 20 MG tablet One after supper 30 tablet 11   irbesartan (AVAPRO) 150 MG tablet Take 1 tablet (150 mg total) by mouth daily. 30 tablet 11   pantoprazole (PROTONIX) 40 MG tablet TAKE 1 TABLET (40 MG TOTAL) BY MOUTH DAILY. TAKE 30-60 MIN  BEFORE FIRST MEAL OF THE DAY 90 tablet 0   azithromycin (ZITHROMAX) 250 MG tablet Take 1 tablet (250 mg total) by mouth daily. Take first 2 tablets together, then 1 every day until finished. (Patient not taking: Reported on 07/05/2021) 6 tablet 0   predniSONE (DELTASONE) 10 MG tablet Take 2 tablets (20 mg total) by mouth daily. (Patient not taking: Reported on 07/05/2021) 15 tablet 0   No facility-administered medications prior to visit.     Allergies:   Patient has no known allergies.   Social History   Socioeconomic History   Marital status: Widowed    Spouse name: Not on file   Number of children: 2   Years of education: Not on file   Highest education level: Not on file  Occupational History   Occupation: retired; Print production planner  Tobacco Use   Smoking status: Every Day    Packs/day: 1.00    Years: 65.00    Pack years: 65.00    Types: Cigarettes   Smokeless tobacco: Never  Vaping Use   Vaping Use: Never used  Substance and Sexual Activity   Alcohol use: Not Currently    Alcohol/week: 0.0 standard drinks   Drug use: Never   Sexual activity: Not Currently  Other Topics Concern   Not on file  Social History Narrative   Widower (married for 5yrs); Remarried in 2016      Social Determinants of Health   Financial Resource Strain: Not on file  Food Insecurity: Not on file  Transportation Needs: Not on file  Physical Activity: Not on file  Stress: Not on file  Social Connections: Not on file     Family History:  The patient's family history includes Heart attack in his father; Liver disease in his sister; Renal Disease in his brother; Stroke in his mother.   Review of Systems:    Please see the history of present illness.     All other systems reviewed and are otherwise negative except as noted above.   Physical Exam:    VS:  BP 138/74   Pulse 68   Ht 5\' 6"  (1.676 m)   Wt 135 lb (61.2 kg)   SpO2 94%   BMI 21.79 kg/m    General: Pleasant elderly male  appearing in no acute distress. Head: Normocephalic, atraumatic. Neck: No carotid bruits. JVD not elevated.  Lungs: Respirations regular and unlabored, without wheezes or rales.  Heart: Regular rate and rhythm. No S3 or S4.  2/6 SEM along RUSB.  Abdomen: Appears non-distended. No obvious abdominal masses. Msk:  Strength and tone appear normal for age. No obvious joint deformities or effusions. Extremities: No clubbing or cyanosis. No pitting edema.  Distal pedal pulses are 2+ bilaterally. Neuro: Alert and oriented X 3. Moves all extremities spontaneously. No focal deficits noted. Psych:  Responds to questions appropriately with a normal affect. Skin: No rashes or lesions noted  Wt Readings from Last 3 Encounters:  07/05/21 135 lb (61.2 kg)  04/06/21 132 lb (59.9 kg)  03/06/21 132 lb (59.9 kg)     Studies/Labs Reviewed:   EKG:  EKG is not ordered today.    Recent Labs: 02/05/2021: B Natriuretic Peptide 211.0; TSH 1.851 02/06/2021: ALT 33; BUN 16; Creatinine, Ser 0.82; Hemoglobin 14.3; Magnesium 2.2; Platelets 82; Potassium 5.0; Sodium 138   Lipid Panel    Component Value Date/Time   TRIG 62 02/05/2021 1156    Additional studies/ records that were reviewed today include:   R/LHC: 02/2018 Ost RCA lesion is 40% stenosed. Prox RCA to Dist RCA lesion is 20% stenosed. 1st RPLB lesion is 20% stenosed. Prox Cx to Mid Cx lesion is 30% stenosed. 3rd Mrg lesion is 30% stenosed. Dist LAD lesion is 70% stenosed. Ost LAD to Prox LAD lesion is 30% stenosed. Dist LM to Ost LAD lesion is 20% stenosed. LV end diastolic pressure is normal.   1. Mild non-obstructive CAD 2. Severe aortic valve stenosis (peak to peak gradient 43 mmHg, mean gradient 38 mmHg, AVA 0.70cm2)   Continue planning for TAVR.   Echocardiogram: 04/2021 IMPRESSIONS     1. Left ventricular ejection fraction, by estimation, is 60 to 65%. The  left ventricle has normal function. The left ventricle has no regional   wall motion abnormalities. Left ventricular diastolic parameters are  consistent with Grade I diastolic  dysfunction (impaired relaxation).   2. Right ventricular systolic function is normal. The right ventricular  size is normal. Tricuspid regurgitation signal is inadequate for assessing  PA pressure.   3. Left atrial size was mildly dilated.   4. The mitral valve is grossly normal. Mild mitral valve regurgitation.   5. The aortic valve has been repaired/replaced. Aortic valve  regurgitation is mild to moderate. There is a 26 mm Sapien prosthetic  (TAVR) valve present in the aortic position. Aortic regurgitation PHT  measures 404 msec. Aortic valve mean gradient  measures 12.0 mmHg.   6. Unable to estimate CVP.   Assessment:    1. Aortic valve stenosis, etiology of cardiac valve disease unspecified   2. Coronary artery disease involving native coronary artery of native heart without angina pectoris   3. Essential hypertension   4. Aneurysm of ascending aorta without rupture      Plan:   In order of problems listed above:  1. Aortic Stenosis - He is s/p TAVR in 03/2018 and most recent echocardiogram imaging has shown mild to moderate perivalvular regurgitation which is overall similar to prior imaging. He remains active at baseline for his age and denies any recent dyspnea on exertion, dizziness or presyncope. Would anticipate arranging for a repeat echocardiogram next year given stable findings by most recent echo in 04/2021.   2. CAD - He did have mild nonobstructive disease by cath in 02/2018. He denies any recent anginal symptoms. Continue ASA 81 mg daily and Atorvastatin 10 mg daily.  3. HTN - His blood pressure was initially elevated but rechecked and improved to 138/74. He reports his BP has overall been well controlled when checked at home. Continue current medication regimen with Irbesartan 150 mg daily.  4. Ascending thoracic aortic aneurysm  - This was at 42 mm by  CT in 07/2020 and will plan for repeat imaging later this year. His creatinine was stable at 0.82 in 01/2021.    Medication Adjustments/Labs and Tests Ordered: Current medicines are reviewed at length with the patient today.  Concerns regarding medicines are outlined above.  Medication changes, Labs and Tests ordered today  are listed in the Patient Instructions below. Patient Instructions  Medication Instructions:   Continue current medication regimen.   Labwork:  None.   Testing/Procedures:  CT Scan of your Aorta to make sure this has remained stable.   Follow-Up:  With Dr. Domenic Polite in 6 months.   Any Other Special Instructions Will Be Listed Below (If Applicable).     If you need a refill on your cardiac medications before your next appointment, please call your pharmacy.   Signed, Erma Heritage, PA-C  07/05/2021 5:24 PM    [facility] [address] [place], [place] [address] Phone: [phone] Fax: [phone]

## 2021-07-11 ENCOUNTER — Encounter (HOSPITAL_COMMUNITY): Payer: Self-pay

## 2021-07-11 ENCOUNTER — Emergency Department (HOSPITAL_COMMUNITY)
Admission: EM | Admit: 2021-07-11 | Discharge: 2021-07-12 | Disposition: A | Discharge status: Home / Self Care | Payer: Medicare Other | Attending: Emergency Medicine | Admitting: Emergency Medicine

## 2021-07-11 ENCOUNTER — Emergency Department (HOSPITAL_COMMUNITY): Payer: Medicare Other

## 2021-07-11 ENCOUNTER — Other Ambulatory Visit: Payer: Self-pay

## 2021-07-11 DIAGNOSIS — R1110 Vomiting, unspecified: Secondary | ICD-10-CM | POA: Diagnosis not present

## 2021-07-11 DIAGNOSIS — Z7951 Long term (current) use of inhaled steroids: Secondary | ICD-10-CM | POA: Insufficient documentation

## 2021-07-11 DIAGNOSIS — R42 Dizziness and giddiness: Secondary | ICD-10-CM | POA: Insufficient documentation

## 2021-07-11 DIAGNOSIS — I10 Essential (primary) hypertension: Secondary | ICD-10-CM | POA: Diagnosis not present

## 2021-07-11 DIAGNOSIS — R6883 Chills (without fever): Secondary | ICD-10-CM | POA: Insufficient documentation

## 2021-07-11 DIAGNOSIS — I959 Hypotension, unspecified: Secondary | ICD-10-CM | POA: Insufficient documentation

## 2021-07-11 DIAGNOSIS — J449 Chronic obstructive pulmonary disease, unspecified: Secondary | ICD-10-CM | POA: Diagnosis not present

## 2021-07-11 DIAGNOSIS — F17210 Nicotine dependence, cigarettes, uncomplicated: Secondary | ICD-10-CM | POA: Insufficient documentation

## 2021-07-11 DIAGNOSIS — R531 Weakness: Secondary | ICD-10-CM | POA: Insufficient documentation

## 2021-07-11 DIAGNOSIS — Z20822 Contact with and (suspected) exposure to covid-19: Secondary | ICD-10-CM | POA: Insufficient documentation

## 2021-07-11 DIAGNOSIS — Z7982 Long term (current) use of aspirin: Secondary | ICD-10-CM | POA: Diagnosis not present

## 2021-07-11 DIAGNOSIS — E876 Hypokalemia: Secondary | ICD-10-CM | POA: Insufficient documentation

## 2021-07-11 DIAGNOSIS — Z8616 Personal history of COVID-19: Secondary | ICD-10-CM | POA: Diagnosis not present

## 2021-07-11 DIAGNOSIS — R9431 Abnormal electrocardiogram [ECG] [EKG]: Secondary | ICD-10-CM | POA: Diagnosis not present

## 2021-07-11 LAB — CBC WITH DIFFERENTIAL/PLATELET
Abs Immature Granulocytes: 0.09 10*3/uL — ABNORMAL HIGH (ref 0.00–0.07)
Basophils Absolute: 0 10*3/uL (ref 0.0–0.1)
Basophils Relative: 0 %
Eosinophils Absolute: 0 10*3/uL (ref 0.0–0.5)
Eosinophils Relative: 0 %
HCT: 40.2 % (ref 39.0–52.0)
Hemoglobin: 13.7 g/dL (ref 13.0–17.0)
Immature Granulocytes: 1 %
Lymphocytes Relative: 4 %
Lymphs Abs: 0.5 10*3/uL — ABNORMAL LOW (ref 0.7–4.0)
MCH: 32.2 pg (ref 26.0–34.0)
MCHC: 34.1 g/dL (ref 30.0–36.0)
MCV: 94.6 fL (ref 80.0–100.0)
Monocytes Absolute: 0.6 10*3/uL (ref 0.1–1.0)
Monocytes Relative: 5 %
Neutro Abs: 10.6 10*3/uL — ABNORMAL HIGH (ref 1.7–7.7)
Neutrophils Relative %: 90 %
Platelets: 135 10*3/uL — ABNORMAL LOW (ref 150–400)
RBC: 4.25 MIL/uL (ref 4.22–5.81)
RDW: 13.2 % (ref 11.5–15.5)
WBC: 11.8 10*3/uL — ABNORMAL HIGH (ref 4.0–10.5)
nRBC: 0 % (ref 0.0–0.2)

## 2021-07-11 LAB — LACTIC ACID, PLASMA
Lactic Acid, Venous: 2.4 mmol/L (ref 0.5–1.9)
Lactic Acid, Venous: 1.5 mmol/L (ref 0.5–1.9)

## 2021-07-11 LAB — COMPREHENSIVE METABOLIC PANEL
ALT: 50 U/L — ABNORMAL HIGH (ref 0–44)
AST: 90 U/L — ABNORMAL HIGH (ref 15–41)
Albumin: 3 g/dL — ABNORMAL LOW (ref 3.5–5.0)
Alkaline Phosphatase: 98 U/L (ref 38–126)
Anion gap: 9 (ref 5–15)
BUN: 19 mg/dL (ref 8–23)
CO2: 25 mmol/L (ref 22–32)
Calcium: 8.1 mg/dL — ABNORMAL LOW (ref 8.9–10.3)
Chloride: 106 mmol/L (ref 98–111)
Creatinine, Ser: 1.11 mg/dL (ref 0.61–1.24)
GFR, Estimated: >60 mL/min (ref >60)
Glucose, Bld: 179 mg/dL — ABNORMAL HIGH (ref 70–99)
Potassium: 3 mmol/L — ABNORMAL LOW (ref 3.5–5.1)
Sodium: 140 mmol/L (ref 135–145)
Total Bilirubin: 1.7 mg/dL — ABNORMAL HIGH (ref 0.3–1.2)
Total Protein: 5.9 g/dL — ABNORMAL LOW (ref 6.5–8.1)

## 2021-07-11 LAB — URINALYSIS, ROUTINE W REFLEX MICROSCOPIC
Bacteria, UA: NONE SEEN
Bilirubin Urine: NEGATIVE
Glucose, UA: NEGATIVE mg/dL
Hgb urine dipstick: NEGATIVE
Ketones, ur: 5 mg/dL — AB
Nitrite: NEGATIVE
Protein, ur: 30 mg/dL — AB
Specific Gravity, Urine: 1.026 (ref 1.005–1.030)
pH: 5 (ref 5.0–8.0)

## 2021-07-11 LAB — RESP PANEL BY RT-PCR (FLU A&B, COVID) ARPGX2
Influenza A by PCR: NEGATIVE
Influenza B by PCR: NEGATIVE
SARS Coronavirus 2 by RT PCR: NEGATIVE

## 2021-07-11 MED ORDER — POTASSIUM CHLORIDE CRYS ER 20 MEQ PO TBCR: 20.0000 meq | EXTENDED_RELEASE_TABLET | Freq: Two times a day (BID) | ORAL | 0 refills | Status: DC

## 2021-07-11 MED ORDER — SODIUM CHLORIDE 0.9 % IV BOLUS
1000.0000 mL | Freq: Once | INTRAVENOUS | Status: AC
  Administered 2021-07-11: 1000 mL via INTRAVENOUS

## 2021-07-11 MED ORDER — LACTATED RINGERS IV BOLUS
1000.0000 mL | Freq: Once | INTRAVENOUS | Status: AC
  Administered 2021-07-11: 1000 mL via INTRAVENOUS

## 2021-07-11 NOTE — Discharge Instructions (Signed)
Do not take your blood pressure medicine in the morning.  Take the potassium supplementation.  Follow-up with your doctor.  Try to keep her self hydrated for now

## 2021-07-11 NOTE — ED Provider Notes (Signed)
[facility] EMERGENCY DEPARTMENT Provider Note   CSN: [id number] Arrival date & time: 07/11/21  2703     History Chief Complaint  Patient presents with   Weakness    Travis Murphy is a 86 y.o. male.   Weakness Associated symptoms: vomiting   Associated symptoms: no chest pain and no shortness of breath   Patient presents generalized weakness and lightheadedness.  Has been feeling bad today.  EMS came around 430 and reportedly was hypotensive.  Would not come to ER however.  No real pain.  Per daughter had been complaining of some chills.  Patient states he did vomit once.  No diarrhea.  No abdominal pain.  No known sick contacts.  States he just feels weak all over.  Hypotensive on arrival but not febrile.    Past Medical History:  Diagnosis Date   Arthritis    Essential hypertension    Hyperlipemia    Hyperplastic colon polyp 2002   Dr. Arnoldo Morale   Pulmonary nodule    a. right lower lobe. 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 severe AS by echo in 2017 b. 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   Hyperlipemia    Cigarette smoker    Incidental pulmonary nodule, greater than or equal to 39mm 04/06/2018   Severe aortic valve stenosis    Essential hypertension    History of colon polyps 07/15/2011    Past Surgical History:  Procedure Laterality Date   CATARACT EXTRACTION Right    COLONOSCOPY   08/17/2001   Small polyp measured 3 mm removed/multiple middle scattered diverticula in the ascending colon-hyperplastic   COLONOSCOPY  08/14/2011   Procedure: COLONOSCOPY;  Surgeon: Daneil Dolin, MD;  [facility];  Service: Endoscopy;  Laterality: N/A;  10:00   EXCISIONAL HEMORRHOIDECTOMY     INTRAOPERATIVE TRANSTHORACIC ECHOCARDIOGRAM N/A 04/21/2018   Procedure: INTRAOPERATIVE TRANSTHORACIC ECHOCARDIOGRAM;  Surgeon: Burnell Blanks, MD;  Location: [place];  Service: Open Heart Surgery;  Laterality: N/A;   RIGHT/LEFT HEART CATH AND CORONARY ANGIOGRAPHY N/A 03/25/2018   Procedure: RIGHT/LEFT HEART CATH AND CORONARY ANGIOGRAPHY;  Surgeon: Burnell Blanks, MD;  Location: [place] CV LAB;  Service: Cardiovascular;  Laterality: N/A;   TRANSCATHETER AORTIC VALVE REPLACEMENT, TRANSFEMORAL  04/21/2018   TRANSCATHETER AORTIC VALVE REPLACEMENT, TRANSFEMORAL N/A 04/21/2018   Procedure: TRANSCATHETER AORTIC VALVE REPLACEMENT, TRANSFEMORAL;  Surgeon: Burnell Blanks, MD;  Location: [place];  Service: Open Heart Surgery;  Laterality: N/A;       Family History  Problem Relation Age of Onset   Stroke Mother    Heart attack Father    Liver disease Sister    Renal Disease Brother     Social History   Tobacco Use   Smoking status: Every Day    Packs/day: 1.00    Years: 65.00    Pack years:  65.00    Types: Cigarettes   Smokeless tobacco: Never  Vaping Use   Vaping Use: Never used  Substance Use Topics   Alcohol use: Not Currently    Alcohol/week: 0.0 standard drinks   Drug use: Never    Home Medications Prior to Admission medications   Medication Sig Start Date End Date Taking? Authorizing Provider  albuterol (VENTOLIN HFA) 108 (90 Base) MCG/ACT inhaler Inhale 1-2 puffs into the lungs every 6 (six) hours as needed for wheezing or shortness of breath. 05/24/21  Yes Vanessa Kick, MD  aspirin EC 81 MG tablet Take 81 mg by mouth daily.   Yes [provider]  atorvastatin (LIPITOR) 10 MG tablet Take 10 mg by mouth daily. 11/15/15  Yes [provider]  irbesartan (AVAPRO) 150 MG tablet Take 1 tablet (150 mg total) by mouth daily. 03/06/21  Yes Tanda Rockers, MD  pantoprazole (PROTONIX) 40 MG tablet TAKE 1 TABLET (40 MG TOTAL) BY MOUTH DAILY. TAKE 30-60 MIN BEFORE FIRST MEAL OF THE DAY 06/01/21  Yes Tanda Rockers, MD  potassium chloride SA (KLOR-CON) 20 MEQ tablet Take 1 tablet (20 mEq total) by mouth 2 (two) times daily. 07/11/21  Yes Davonna Belling, MD  [facility] TUSSIN DM 10-100 MG/5ML liquid SMARTSIG:10 Milliliter(s) By Mouth Every 4 Hours PRN Patient not taking: Reported on 07/11/2021 02/06/21   [provider]  famotidine (PEPCID) 20 MG tablet One after supper Patient not taking: Reported on 07/11/2021 03/06/21   Tanda Rockers, MD    Allergies    Patient has no known allergies.  Review of Systems   Review of Systems  Constitutional:  Positive for appetite change and fatigue.  HENT:  Negative for congestion.   Respiratory:  Negative for shortness of breath.   Cardiovascular:  Negative for chest pain.  Gastrointestinal:  Positive for vomiting.  Musculoskeletal:  Negative for back pain.  Skin:  Negative for rash.  Neurological:  Positive for weakness and light-headedness.  Psychiatric/Behavioral:  Negative for confusion.    Physical Exam Updated Vital Signs BP 100/60   Pulse 63   Temp 98.7 F (37.1 C)   Resp (!) 23   Ht 5\' 6"  (1.676 m)   Wt 61.2 kg   SpO2 94%   BMI 21.79 kg/m   Physical Exam Vitals and nursing note reviewed.  HENT:     Head: Normocephalic.  Eyes:     Extraocular Movements: Extraocular movements intact.     Pupils: Pupils are equal, round, and reactive to light.  Cardiovascular:     Rate and Rhythm: Normal rate and regular rhythm.  Pulmonary:     Breath sounds: No wheezing or rales.  Chest:     Chest wall: No tenderness.  Abdominal:     Tenderness: There is no abdominal  tenderness.  Musculoskeletal:        General: No tenderness.  Skin:    General: Skin is warm.     Capillary Refill: Capillary refill takes less than 2 seconds.     Coloration: Skin is not jaundiced.  Neurological:     Mental Status: He is oriented to person, place, and time.    ED Results / Procedures / Treatments   Labs (all labs ordered are listed, but only abnormal results are displayed) Labs Reviewed  COMPREHENSIVE METABOLIC PANEL - Abnormal; Notable for the following components:      Result Value   Potassium 3.0 (*)    Glucose, Bld 179 (*)  Calcium 8.1 (*)    Total Protein 5.9 (*)    Albumin 3.0 (*)    AST 90 (*)    ALT 50 (*)    Total Bilirubin 1.7 (*)    All other components within normal limits  CBC WITH DIFFERENTIAL/PLATELET - Abnormal; Notable for the following components:   WBC 11.8 (*)    Platelets 135 (*)    Neutro Abs 10.6 (*)    Lymphs Abs 0.5 (*)    Abs Immature Granulocytes 0.09 (*)    All other components within normal limits  URINALYSIS, ROUTINE W REFLEX MICROSCOPIC - Abnormal; Notable for the following components:   Color, Urine AMBER (*)    APPearance HAZY (*)    Ketones, ur 5 (*)    Protein, ur 30 (*)    Leukocytes,Ua TRACE (*)    All other components within normal limits  LACTIC ACID, PLASMA - Abnormal; Notable for the following components:   Lactic Acid, Venous 2.4 (*)    All other components within normal limits  CULTURE, BLOOD (ROUTINE X 2)  CULTURE, BLOOD (ROUTINE X 2)  RESP PANEL BY RT-PCR (FLU A&B, COVID) [id number]  LACTIC ACID, PLASMA    EKG EKG Interpretation  Date/Time:  Wednesday July 11 2021 18:56:46 EST Ventricular Rate:  90 PR Interval:  142 QRS Duration: 87 QT Interval:  386 QTC Calculation: 473 R Axis:   -10 Text Interpretation: Sinus rhythm Anterior infarct, old No significant change since last tracing Confirmed by Davonna Belling [phone]) on 07/11/2021 7:25:03 PM  Radiology DG Chest Portable 1 View  Result  Date: 07/11/2021 CLINICAL DATA:  Weakness EXAM: PORTABLE CHEST 1 VIEW COMPARISON:  02/05/2021 FINDINGS: Heart and mediastinal contours are within normal limits. No focal opacities or effusions. No acute bony abnormality. IMPRESSION: No active disease. Electronically Signed   By: Rolm Baptise M.D.   On: 07/11/2021 19:36    Procedures Procedures   Medications Ordered in ED Medications  sodium chloride 0.9 % bolus 1,000 mL (0 mLs Intravenous Stopped 07/11/21 2035)  lactated ringers bolus 1,000 mL (0 mLs Intravenous Stopped 07/11/21 2334)    ED Course  I have reviewed the triage vital signs and the nursing notes.  Pertinent labs & imaging results that were available during my care of the patient were reviewed by me and considered in my medical decision making (see chart for details).    MDM Rules/Calculators/A&P                           Patient brought in for hypotension.  Initially hypotensive but unsure why.  No fevers.  No chills.  States he has had good oral intake.  Fluid boluses given and blood pressure improved.  Feels much better.  Able to ambulate without difficulty.  Mild hypokalemia but otherwise labs reassuring.  Patient is eager to go home.  I think it is reasonable to follow-up as an outpatient.  No infection seen.  Doubt cardiac ischemia.  Severe valve abnormality felt less likely.  Will discharge home. Final Clinical Impression(s) / ED Diagnoses Final diagnoses:  Hypotension, unspecified hypotension type  Hypokalemia    Rx / DC Orders ED Discharge Orders          Ordered    potassium chloride SA (KLOR-CON) 20 MEQ tablet  2 times daily        07/11/21 2357             Davonna Belling, MD 07/12/21  0042  

## 2021-07-11 NOTE — ED Notes (Signed)
Ambulated pt in the hallway without any assist. Asked pt if he felt any dizziness and or weakness while walking. Pt stated "no he felt fine".

## 2021-07-11 NOTE — ED Triage Notes (Signed)
Pov from home with cc of weakness and not feeling right since this morning. Ems checked him out around 430pm but he did not want them to bring him. Denies any pain Also has thrown up once .

## 2021-07-12 ENCOUNTER — Telehealth: Payer: Self-pay | Admitting: Student

## 2021-07-12 NOTE — Telephone Encounter (Signed)
Travis Murphy is calling stating Travis Murphy was hospitalized yesterday due to being dehydrated. While there his BP was 90/50, never going above 150. They released him around midnight putting him on potasium and telling him not to take his BP medication today. They also requested they call in and discuss adjustments to medications. Please advise.

## 2021-07-12 NOTE — Telephone Encounter (Signed)
     The only thing he takes that impacts his blood pressure is Irbesartan 150 mg daily. I would recommend that they hold that for now and track his blood pressure readings over the next few days. Pending his trend, could consider starting this back at a lower dose of 75 mg daily.  Signed, Erma Heritage, PA-C 07/12/2021, 11:51 AM

## 2021-07-12 NOTE — Telephone Encounter (Signed)
Pt's daughter notified to hold Irbesartan for now and monitor BP.

## 2021-07-12 NOTE — Telephone Encounter (Signed)
Spoke with daughter Helene Kelp who states that her father was not feeling well on yesterday and she called EMS and his BP was noted to be 90/50. Father would not go to the hospital at that time but did decide to go later that day with his other daughter. Pt was noted to be dehydrated. He was also given a prescription for potassium and told not to take his blood pressure medication until he spoke with our office. Daughter will obtain current BP and call office.

## 2021-07-16 ENCOUNTER — Telehealth (HOSPITAL_BASED_OUTPATIENT_CLINIC_OR_DEPARTMENT_OTHER): Payer: Self-pay | Admitting: *Deleted

## 2021-07-16 ENCOUNTER — Other Ambulatory Visit: Payer: Self-pay

## 2021-07-16 ENCOUNTER — Encounter (HOSPITAL_COMMUNITY): Payer: Self-pay

## 2021-07-16 ENCOUNTER — Emergency Department (HOSPITAL_COMMUNITY)
Admission: EM | Admit: 2021-07-16 | Discharge: 2021-07-16 | Disposition: A | Discharge status: Home / Self Care | Payer: Medicare Other | Attending: Emergency Medicine | Admitting: Emergency Medicine

## 2021-07-16 ENCOUNTER — Telehealth: Payer: Self-pay | Admitting: Student

## 2021-07-16 DIAGNOSIS — R799 Abnormal finding of blood chemistry, unspecified: Secondary | ICD-10-CM | POA: Diagnosis not present

## 2021-07-16 DIAGNOSIS — Z955 Presence of coronary angioplasty implant and graft: Secondary | ICD-10-CM | POA: Diagnosis not present

## 2021-07-16 DIAGNOSIS — Z8616 Personal history of COVID-19: Secondary | ICD-10-CM | POA: Insufficient documentation

## 2021-07-16 DIAGNOSIS — Z7982 Long term (current) use of aspirin: Secondary | ICD-10-CM | POA: Diagnosis not present

## 2021-07-16 DIAGNOSIS — Z79899 Other long term (current) drug therapy: Secondary | ICD-10-CM | POA: Insufficient documentation

## 2021-07-16 DIAGNOSIS — I10 Essential (primary) hypertension: Secondary | ICD-10-CM | POA: Diagnosis not present

## 2021-07-16 DIAGNOSIS — J449 Chronic obstructive pulmonary disease, unspecified: Secondary | ICD-10-CM | POA: Diagnosis not present

## 2021-07-16 DIAGNOSIS — R899 Unspecified abnormal finding in specimens from other organs, systems and tissues: Secondary | ICD-10-CM

## 2021-07-16 DIAGNOSIS — F17210 Nicotine dependence, cigarettes, uncomplicated: Secondary | ICD-10-CM | POA: Diagnosis not present

## 2021-07-16 LAB — BLOOD CULTURE ID PANEL (REFLEXED) - BCID2

## 2021-07-16 LAB — CULTURE, BLOOD (ROUTINE X 2)
Culture: NO GROWTH
Special Requests: ADEQUATE

## 2021-07-16 LAB — BASIC METABOLIC PANEL
Anion gap: 4 — ABNORMAL LOW (ref 5–15)
BUN: 12 mg/dL (ref 8–23)
CO2: 29 mmol/L (ref 22–32)
Calcium: 8.5 mg/dL — ABNORMAL LOW (ref 8.9–10.3)
Chloride: 107 mmol/L (ref 98–111)
Creatinine, Ser: 0.84 mg/dL (ref 0.61–1.24)
GFR, Estimated: >60 mL/min (ref >60)
Glucose, Bld: 95 mg/dL (ref 70–99)
Potassium: 3.7 mmol/L (ref 3.5–5.1)
Sodium: 140 mmol/L (ref 135–145)

## 2021-07-16 LAB — CBC WITH DIFFERENTIAL/PLATELET
Abs Immature Granulocytes: 0.04 10*3/uL (ref 0.00–0.07)
Basophils Absolute: 0 10*3/uL (ref 0.0–0.1)
Basophils Relative: 1 %
Eosinophils Absolute: 0.2 10*3/uL (ref 0.0–0.5)
Eosinophils Relative: 2 %
HCT: 41.5 % (ref 39.0–52.0)
Hemoglobin: 13.9 g/dL (ref 13.0–17.0)
Immature Granulocytes: 1 %
Lymphocytes Relative: 20 %
Lymphs Abs: 1.6 10*3/uL (ref 0.7–4.0)
MCH: 32.3 pg (ref 26.0–34.0)
MCHC: 33.5 g/dL (ref 30.0–36.0)
MCV: 96.3 fL (ref 80.0–100.0)
Monocytes Absolute: 0.6 10*3/uL (ref 0.1–1.0)
Monocytes Relative: 7 %
Neutro Abs: 5.6 10*3/uL (ref 1.7–7.7)
Neutrophils Relative %: 69 %
Platelets: 170 10*3/uL (ref 150–400)
RBC: 4.31 MIL/uL (ref 4.22–5.81)
RDW: 13.2 % (ref 11.5–15.5)
WBC: 8.1 10*3/uL (ref 4.0–10.5)
nRBC: 0 % (ref 0.0–0.2)

## 2021-07-16 MED ORDER — IRBESARTAN 75 MG PO TABS: 75.0000 mg | ORAL_TABLET | Freq: Every day | ORAL | 3 refills | Status: DC

## 2021-07-16 NOTE — Telephone Encounter (Signed)
Pt's daughter stated the pt's bp has been elevated for several days following holding Irbesartan. Please advise

## 2021-07-16 NOTE — Telephone Encounter (Signed)
    I would recommend that he go ahead and restart Irbesartan but at a lower dose of 75mg  daily. Follow BP trend for 1-2 weeks and report back with readings.   Signed, Erma Heritage, PA-C 07/16/2021, 12:22 PM Pager: [phone]

## 2021-07-16 NOTE — Telephone Encounter (Signed)
Pt c/o BP issue: STAT if pt c/o blurred vision, one-sided weakness or slurred speech  1. What are your last 5 BP readings?  169/83 first woke up 159/82 around 8:00  154/85 around 9:00  2. Are you having any other symptoms (ex. Dizziness, headache, blurred vision, passed out)? Felt fine  3. What is your BP issue? Daughter of patient called to let the office know that the patient's BP was elevated. He is waiting to be seen in the ED because of an infection that was found in his blood. The patient's daughter wanted to talk to Cherlyn Cushing about her Dad

## 2021-07-16 NOTE — Telephone Encounter (Signed)
Medication list updated to reflect changes made.   Pt's daughter agreeable to keep bp log for 1-2 weeks and report back with readings.

## 2021-07-16 NOTE — Discharge Instructions (Addendum)
Return to the Emergency department if fever chills or other symptoms of infection.  See Dr. Willey Blade as scheduled

## 2021-07-16 NOTE — ED Triage Notes (Signed)
Per epic notes, pt positive for gram pos cocci and was told to return to ed for further evaluation. Pt states he feels "good."  Says was here last week for dehydration.

## 2021-07-17 NOTE — ED Provider Notes (Signed)
[facility] EMERGENCY DEPARTMENT Provider Note   CSN: [id number] Arrival date & time: 07/16/21  9735     History Chief Complaint  Patient presents with   Abnormal Lab    Travis Murphy is a 86 y.o. male.  Pt called and advised to return because he had a positive blood culture.  Pt denies any current complaint.  Pt reports he feels well.  No fever, no cough,  Pt had gram positive cocci in one blood culture   Abnormal Lab Patient referred by:  ED personnel Resulting agency:  Internal Result type: cultures       Past Medical History:  Diagnosis Date   Arthritis    Essential hypertension    Hyperlipemia    Hyperplastic colon polyp 2002   Dr. Arnoldo Morale   Pulmonary nodule    a. right lower lobe. 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 severe AS by echo in 2017 b. 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   Hyperlipemia    Cigarette smoker    Incidental pulmonary nodule, greater than or equal to 19mm 04/06/2018   Severe aortic valve stenosis    Essential hypertension    History of colon polyps 07/15/2011    Past Surgical History:  Procedure Laterality Date   CATARACT EXTRACTION Right    COLONOSCOPY  08/17/2001   Small polyp measured 3 mm removed/multiple middle scattered diverticula in the ascending colon-hyperplastic   COLONOSCOPY  08/14/2011   Procedure: COLONOSCOPY;  Surgeon: Daneil Dolin, MD;   [facility];  Service: Endoscopy;  Laterality: N/A;  10:00   EXCISIONAL HEMORRHOIDECTOMY     INTRAOPERATIVE TRANSTHORACIC ECHOCARDIOGRAM N/A 04/21/2018   Procedure: INTRAOPERATIVE TRANSTHORACIC ECHOCARDIOGRAM;  Surgeon: Burnell Blanks, MD;  Location: [place];  Service: Open Heart Surgery;  Laterality: N/A;   RIGHT/LEFT HEART CATH AND CORONARY ANGIOGRAPHY N/A 03/25/2018   Procedure: RIGHT/LEFT HEART CATH AND CORONARY ANGIOGRAPHY;  Surgeon: Burnell Blanks, MD;  Location: [place] [facility];  Service: Cardiovascular;  Laterality: N/A;   TRANSCATHETER AORTIC VALVE REPLACEMENT, TRANSFEMORAL  04/21/2018   TRANSCATHETER AORTIC VALVE REPLACEMENT, TRANSFEMORAL N/A 04/21/2018   Procedure: TRANSCATHETER AORTIC VALVE REPLACEMENT, TRANSFEMORAL;  Surgeon: Burnell Blanks, MD;  Location: [place];  Service: Open Heart Surgery;  Laterality: N/A;       Family History  Problem Relation Age of Onset   Stroke Mother    Heart attack Father    Liver disease Sister    Renal Disease Brother     Social History   Tobacco Use   Smoking status: Every Day    Packs/day: 1.00    Years: 65.00    Pack years: 65.00    Types: Cigarettes   Smokeless tobacco: Never  Vaping Use   Vaping Use: Never used  Substance Use Topics   Alcohol use: Not Currently  Alcohol/week: 0.0 standard drinks   Drug use: Never    Home Medications Prior to Admission medications   Medication Sig Start Date End Date Taking? Authorizing Provider  aspirin EC 81 MG tablet Take 81 mg by mouth daily.   Yes [provider]  atorvastatin (LIPITOR) 10 MG tablet Take 10 mg by mouth daily. 11/15/15  Yes [provider]  famotidine (PEPCID) 20 MG tablet One after supper Patient taking differently: Take 20 mg by mouth daily. 03/06/21  Yes Tanda Rockers, MD  pantoprazole (PROTONIX) 40 MG tablet TAKE 1 TABLET (40 MG TOTAL) BY MOUTH DAILY. TAKE 30-60 MIN BEFORE FIRST MEAL OF THE DAY 06/01/21  Yes  Tanda Rockers, MD  potassium chloride SA (KLOR-CON) 20 MEQ tablet Take 1 tablet (20 mEq total) by mouth 2 (two) times daily. 07/11/21  Yes Davonna Belling, MD  albuterol (VENTOLIN HFA) 108 (90 Base) MCG/ACT inhaler Inhale 1-2 puffs into the lungs every 6 (six) hours as needed for wheezing or shortness of breath. Patient not taking: Reported on 07/16/2021 05/24/21   Vanessa Kick, MD  irbesartan (AVAPRO) 75 MG tablet Take 1 tablet (75 mg total) by mouth daily. Patient not taking: Reported on 07/16/2021 07/16/21   Erma Heritage, PA-C    Allergies    Patient has no known allergies.  Review of Systems   Review of Systems  All other systems reviewed and are negative.  Physical Exam Updated Vital Signs BP (!) 138/97 (BP Location: Right Arm)   Pulse 64   Temp 97.6 F (36.4 C) (Oral)   Resp 18   Ht 5\' 6"  (1.676 m)   Wt 64.4 kg   SpO2 93%   BMI 22.92 kg/m   Physical Exam Vitals and nursing note reviewed.  Constitutional:      Appearance: He is well-developed.  HENT:     Head: Normocephalic.  Cardiovascular:     Rate and Rhythm: Normal rate.  Pulmonary:     Effort: Pulmonary effort is normal.  Abdominal:     General: There is no distension.  Musculoskeletal:        General: Normal range of motion.     Cervical back: Normal range of motion.  Neurological:     Mental Status: He is alert and oriented to person, place, and time.  Psychiatric:        Mood and Affect: Mood normal.    ED Results / Procedures / Treatments   Labs (all labs ordered are listed, but only abnormal results are displayed) Labs Reviewed  BASIC METABOLIC PANEL - Abnormal; Notable for the following components:      Result Value   Calcium 8.5 (*)    Anion gap 4 (*)    All other components within normal limits  CULTURE, BLOOD (ROUTINE X 2)  CULTURE, BLOOD (ROUTINE X 2)  CBC WITH DIFFERENTIAL/PLATELET    EKG None  Radiology No results found.  Procedures Procedures   Medications  Ordered in ED Medications - No data to display  ED Course  I have reviewed the triage vital signs and the nursing notes.  Pertinent labs & imaging results that were available during my care of the patient were reviewed by me and considered in my medical decision making (see chart for details).    MDM Rules/Calculators/A&P                           MDM:  Pt looks good,  repeat  labs  cbc no acute abnormality.  Blood cultures repeated.  Dr. Kathrynn Humble in to see and examine.  Pt discharged.  Pt advised to return if any problems.  Final Clinical Impression(s) / ED Diagnoses Final diagnoses:  Abnormal laboratory test    Rx / DC Orders ED Discharge Orders     None     An After Visit Summary was printed and given to the patient.    Fransico Meadow, [place] 07/17/21 [place], [place], MD 07/18/21 [phone]

## 2021-07-21 LAB — CULTURE, BLOOD (ROUTINE X 2)
Culture: NO GROWTH
Culture: NO GROWTH
Special Requests: ADEQUATE
Special Requests: ADEQUATE

## 2021-07-21 NOTE — ED Notes (Signed)
[facility]: Phone call rec re: Blood Culture results. Information printed and provided to on duty [facility] ED for review and recommendations

## 2021-07-22 ENCOUNTER — Telehealth: Payer: Self-pay | Admitting: Emergency Medicine

## 2021-07-30 ENCOUNTER — Telehealth: Payer: Self-pay | Admitting: Student

## 2021-07-30 NOTE — Telephone Encounter (Signed)
   BP has overall been elevated. I would recommend going ahead and increasing Irbesartan to his prior dose of 150mg  daily. Repeat BMET in 2 weeks.   Signed, Erma Heritage, PA-C 07/30/2021, 6:47 PM

## 2021-07-30 NOTE — Telephone Encounter (Signed)
   Pt c/o BP issue: STAT if pt c/o blurred vision, one-sided weakness or slurred speech  1. What are your last 5 BP readings? All readings were taken in the morning. Daughter will confirm if it was before or after medication 07/16/21: 159/83 07/17/21: 160/82 07/18/21: 146/73 07/19/21: 152/85 07/20/21: 141/74 07/22/21: 157/82 (pt forgot to take BP 07/21/21) 07/23/21: 154/78 07/24/21: 157/78 07/25/21: 154/78 07/26/21: 157/78 07/27/21: 153/78 07/28/21: 168/85 07/29/21: 153/83 07/30/21: 157/87  2. Are you having any other symptoms (ex. Dizziness, headache, blurred vision, passed out)? no  3. What is your BP issue? Daughter of the patient called. She wanted to document the patient's BP readings. He went to the ED and afterwards was advised to reduce the amount of BP medication and has been taking his BP since

## 2021-07-31 MED ORDER — IRBESARTAN 150 MG PO TABS: 150.0000 mg | ORAL_TABLET | Freq: Every day | ORAL | 3 refills | Status: DC

## 2021-07-31 NOTE — Telephone Encounter (Signed)
I spoke with daughter, Helene Kelp, and advised her to increase Irbesartan to 150 mg daily. They still have 150 mg tablets at home along with 75 mg they will use up. She will call me when the need a refill.

## 2021-08-09 ENCOUNTER — Other Ambulatory Visit: Payer: Self-pay

## 2021-08-09 ENCOUNTER — Ambulatory Visit (HOSPITAL_COMMUNITY)
Admission: RE | Admit: 2021-08-09 | Discharge: 2021-08-09 | Disposition: A | Discharge status: Home / Self Care | Payer: Medicare Other | Source: Ambulatory Visit | Attending: Student | Admitting: Student

## 2021-08-09 DIAGNOSIS — I7121 Aneurysm of the ascending aorta, without rupture: Secondary | ICD-10-CM

## 2021-08-09 DIAGNOSIS — J9811 Atelectasis: Secondary | ICD-10-CM | POA: Diagnosis not present

## 2021-08-09 MED ORDER — IOHEXOL 350 MG/ML SOLN
100.0000 mL | Freq: Once | INTRAVENOUS | Status: AC | PRN
  Administered 2021-08-09: 75 mL via INTRAVENOUS

## 2021-08-30 ENCOUNTER — Other Ambulatory Visit: Payer: Self-pay | Admitting: Internal Medicine

## 2021-09-23 DIAGNOSIS — E1129 Type 2 diabetes mellitus with other diabetic kidney complication: Secondary | ICD-10-CM | POA: Diagnosis not present

## 2021-09-23 DIAGNOSIS — I10 Essential (primary) hypertension: Secondary | ICD-10-CM | POA: Diagnosis not present

## 2021-09-23 DIAGNOSIS — E785 Hyperlipidemia, unspecified: Secondary | ICD-10-CM | POA: Diagnosis not present

## 2021-10-05 DIAGNOSIS — E1122 Type 2 diabetes mellitus with diabetic chronic kidney disease: Secondary | ICD-10-CM | POA: Diagnosis not present

## 2021-10-05 DIAGNOSIS — R413 Other amnesia: Secondary | ICD-10-CM | POA: Diagnosis not present

## 2021-10-05 DIAGNOSIS — I7111 Aneurysm of the ascending aorta, ruptured: Secondary | ICD-10-CM | POA: Diagnosis not present

## 2021-10-05 DIAGNOSIS — Z79899 Other long term (current) drug therapy: Secondary | ICD-10-CM | POA: Diagnosis not present

## 2021-10-08 ENCOUNTER — Telehealth: Payer: Self-pay | Admitting: Cardiology

## 2021-10-08 ENCOUNTER — Telehealth: Payer: Self-pay | Admitting: Student

## 2021-10-08 ENCOUNTER — Other Ambulatory Visit (HOSPITAL_COMMUNITY): Payer: Self-pay | Admitting: Internal Medicine

## 2021-10-08 ENCOUNTER — Other Ambulatory Visit: Payer: Self-pay | Admitting: Internal Medicine

## 2021-10-08 DIAGNOSIS — R413 Other amnesia: Secondary | ICD-10-CM

## 2021-10-08 NOTE — Telephone Encounter (Signed)
Patient called and wanted to know since the patient had an TAVR done in 2019 will it affect the patient having an MRI.

## 2021-10-09 NOTE — Telephone Encounter (Signed)
Notified pt that he can have MRI with having a TAVR in 2019. Pt verbalized understanding.

## 2021-10-18 ENCOUNTER — Ambulatory Visit (HOSPITAL_COMMUNITY)
Admission: RE | Admit: 2021-10-18 | Discharge: 2021-10-18 | Disposition: A | Discharge status: Home / Self Care | Payer: Medicare Other | Source: Ambulatory Visit | Attending: Internal Medicine | Admitting: Internal Medicine

## 2021-10-18 ENCOUNTER — Other Ambulatory Visit: Payer: Self-pay

## 2021-10-18 DIAGNOSIS — R413 Other amnesia: Secondary | ICD-10-CM | POA: Diagnosis not present

## 2021-10-18 DIAGNOSIS — G319 Degenerative disease of nervous system, unspecified: Secondary | ICD-10-CM | POA: Diagnosis not present

## 2021-10-22 DIAGNOSIS — M720 Palmar fascial fibromatosis [Dupuytren]: Secondary | ICD-10-CM | POA: Diagnosis not present

## 2021-10-22 DIAGNOSIS — M24541 Contracture, right hand: Secondary | ICD-10-CM | POA: Insufficient documentation

## 2021-10-22 DIAGNOSIS — M65341 Trigger finger, right ring finger: Secondary | ICD-10-CM | POA: Diagnosis not present

## 2021-10-25 DIAGNOSIS — I10 Essential (primary) hypertension: Secondary | ICD-10-CM | POA: Diagnosis not present

## 2021-10-25 DIAGNOSIS — E785 Hyperlipidemia, unspecified: Secondary | ICD-10-CM | POA: Diagnosis not present

## 2021-10-25 DIAGNOSIS — E1129 Type 2 diabetes mellitus with other diabetic kidney complication: Secondary | ICD-10-CM | POA: Diagnosis not present

## 2021-10-26 DIAGNOSIS — C07 Malignant neoplasm of parotid gland: Secondary | ICD-10-CM | POA: Diagnosis not present

## 2021-10-26 DIAGNOSIS — E1122 Type 2 diabetes mellitus with diabetic chronic kidney disease: Secondary | ICD-10-CM | POA: Diagnosis not present

## 2021-10-26 DIAGNOSIS — D696 Thrombocytopenia, unspecified: Secondary | ICD-10-CM | POA: Diagnosis not present

## 2021-11-09 ENCOUNTER — Other Ambulatory Visit: Payer: Self-pay | Admitting: Internal Medicine

## 2021-11-19 DIAGNOSIS — M65341 Trigger finger, right ring finger: Secondary | ICD-10-CM | POA: Diagnosis not present

## 2021-11-23 DIAGNOSIS — E1129 Type 2 diabetes mellitus with other diabetic kidney complication: Secondary | ICD-10-CM | POA: Diagnosis not present

## 2021-11-23 DIAGNOSIS — I10 Essential (primary) hypertension: Secondary | ICD-10-CM | POA: Diagnosis not present

## 2021-11-23 DIAGNOSIS — E785 Hyperlipidemia, unspecified: Secondary | ICD-10-CM | POA: Diagnosis not present

## 2021-12-17 ENCOUNTER — Other Ambulatory Visit: Payer: Self-pay | Admitting: Internal Medicine

## 2021-12-27 ENCOUNTER — Other Ambulatory Visit: Payer: Self-pay | Admitting: Otolaryngology

## 2021-12-27 ENCOUNTER — Encounter: Payer: Self-pay | Admitting: *Deleted

## 2021-12-27 ENCOUNTER — Other Ambulatory Visit (HOSPITAL_COMMUNITY): Payer: Self-pay | Admitting: Otolaryngology

## 2021-12-27 DIAGNOSIS — K119 Disease of salivary gland, unspecified: Secondary | ICD-10-CM | POA: Diagnosis not present

## 2021-12-27 DIAGNOSIS — K118 Other diseases of salivary glands: Secondary | ICD-10-CM

## 2021-12-27 NOTE — Progress Notes (Unsigned)
Travis Cleveland, MD  [name] ?Ok    ? ?[place] FNA L Parotid nodule  ?See MR 10/18/21 Im19 Se 9  ? ?DDH ?

## 2022-01-02 ENCOUNTER — Other Ambulatory Visit (HOSPITAL_COMMUNITY): Payer: Self-pay | Admitting: Physician Assistant

## 2022-01-04 ENCOUNTER — Ambulatory Visit (HOSPITAL_COMMUNITY)
Admission: RE | Admit: 2022-01-04 | Discharge: 2022-01-04 | Disposition: A | Discharge status: Home / Self Care | Payer: Medicare Other | Source: Ambulatory Visit | Attending: Otolaryngology | Admitting: Otolaryngology

## 2022-01-04 DIAGNOSIS — K118 Other diseases of salivary glands: Secondary | ICD-10-CM | POA: Diagnosis not present

## 2022-01-04 DIAGNOSIS — D110 Benign neoplasm of parotid gland: Secondary | ICD-10-CM | POA: Diagnosis not present

## 2022-01-04 MED ORDER — LIDOCAINE HCL (PF) 1 % IJ SOLN
INTRAMUSCULAR | Status: AC
  Filled 2022-01-04: qty 30

## 2022-01-04 MED ORDER — SODIUM CHLORIDE 0.9 % IV SOLN: INTRAVENOUS | Status: DC

## 2022-01-04 MED ORDER — GELATIN ABSORBABLE 12-7 MM EX MISC
CUTANEOUS | Status: AC
  Filled 2022-01-04: qty 1

## 2022-01-04 NOTE — Procedures (Signed)
Interventional Radiology Procedure Note ? ?Procedure: [place] LEFT PAROTID LESION CORE BX   ? ?Complications: None ? ?Estimated Blood Loss:  MIN ? ?Findings: ?18 G CORES IN SALINE   ? ?M. Daryll Brod, MD ? ? ? ?

## 2022-01-07 LAB — SURGICAL PATHOLOGY

## 2022-01-30 DIAGNOSIS — D696 Thrombocytopenia, unspecified: Secondary | ICD-10-CM | POA: Diagnosis not present

## 2022-01-30 DIAGNOSIS — E1129 Type 2 diabetes mellitus with other diabetic kidney complication: Secondary | ICD-10-CM | POA: Diagnosis not present

## 2022-01-30 DIAGNOSIS — Z79899 Other long term (current) drug therapy: Secondary | ICD-10-CM | POA: Diagnosis not present

## 2022-02-11 DIAGNOSIS — E1122 Type 2 diabetes mellitus with diabetic chronic kidney disease: Secondary | ICD-10-CM | POA: Diagnosis not present

## 2022-02-11 DIAGNOSIS — R7309 Other abnormal glucose: Secondary | ICD-10-CM | POA: Diagnosis not present

## 2022-02-11 DIAGNOSIS — I7121 Aneurysm of the ascending aorta, without rupture: Secondary | ICD-10-CM | POA: Diagnosis not present

## 2022-05-07 NOTE — Progress Notes (Unsigned)
Cardiology Office Note    Date:  05/08/2022   ID:  Travis, Murphy 1934/10/13, MRN [id number]  PCP:  Asencion Noble, MD  Cardiologist: Rozann Lesches, MD    Chief Complaint  Patient presents with   Follow-up    History of Present Illness:    Travis Murphy is a 87 y.o. male with past medical history of severe aortic stenosis (s/p TAVR in 03/2018), CAD (mild nonobstructive disease by catheterization in 02/2018), ascending thoracic aortic aneurysm, HTN and HLD who presents to the office today for overdue 19-monthfollow-up.  He was last examined by myself in 06/2021 and reported overall doing well at that time and was still active in doing yard work around his home. He was continued on his current cardiac medications with plans for a repeat CT later that year for reassessment of his thoracic aortic aneurysm. He did have a repeat CT in 07/2021 which showed his aneurysm remained stable at 42 mm with follow-up imaging recommended in 1 year.  In talking with the patient and his daughter today, he reports overall doing well since his last office visit. He denies any recent chest pain or palpitations. No specific dyspnea on exertion, orthopnea, PND or pitting edema. He does report having a dry cough and is unaware of any specific triggers. He does continue to smoke approximately 1 pack/day and prior CTA in 07/2021 showed emphysema with subpleural parenchymal scarring in the lower lobes.    Past Medical History:  Diagnosis Date   Arthritis    Essential hypertension    Hyperlipemia    Hyperplastic colon polyp 2002   Dr. JArnoldo Morale  Pulmonary nodule    a. right lower lobe. 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 severe AS by echo in 2017 b. 01/2018: repeat echo showing EF of  55-60%, Grade 2 DD, and severe AS with mean gradient of 58 mm Hg   Vertigo     Past Surgical History:  Procedure Laterality Date   CATARACT EXTRACTION Right    COLONOSCOPY  08/17/2001   Small polyp measured 3 mm removed/multiple middle scattered diverticula in the ascending colon-hyperplastic   COLONOSCOPY  08/14/2011   Procedure: COLONOSCOPY;  Surgeon: RDaneil Dolin MD;  [facility];  Service: Endoscopy;  Laterality: N/A;  10:00   EXCISIONAL HEMORRHOIDECTOMY     INTRAOPERATIVE TRANSTHORACIC ECHOCARDIOGRAM N/A 04/21/2018   Procedure: INTRAOPERATIVE TRANSTHORACIC ECHOCARDIOGRAM;  Surgeon: MBurnell Blanks MD;  [facility]  Service: Open Heart Surgery;  Laterality: N/A;   RIGHT/LEFT HEART CATH AND CORONARY ANGIOGRAPHY N/A 03/25/2018   Procedure: RIGHT/LEFT HEART CATH AND CORONARY ANGIOGRAPHY;  Surgeon: MBurnell Blanks MD;  [facility];  Service: Cardiovascular;  Laterality: N/A;   TRANSCATHETER AORTIC VALVE REPLACEMENT, TRANSFEMORAL  04/21/2018   TRANSCATHETER AORTIC VALVE REPLACEMENT, TRANSFEMORAL N/A 04/21/2018   Procedure: TRANSCATHETER AORTIC VALVE REPLACEMENT, TRANSFEMORAL;  Surgeon: MBurnell Blanks MD;  [facility]  Service: Open Heart Surgery;  Laterality: N/A;    Current Medications: Outpatient Medications Prior to Visit  Medication Sig Dispense Refill   albuterol (VENTOLIN HFA) 108 (90 Base) MCG/ACT inhaler Inhale 1-2 puffs into the lungs every 6 (six) hours as needed for wheezing or shortness of breath. 18 g 0  aspirin EC 81 MG tablet Take 81 mg by mouth daily.     atorvastatin (LIPITOR) 10 MG tablet Take 10 mg by mouth daily.  4   famotidine (PEPCID) 20 MG tablet One after supper (Patient taking differently: Take 20 mg by mouth daily.) 30 tablet 11   irbesartan (AVAPRO) 150 MG tablet Take 1 tablet (150 mg total) by mouth daily. 90 tablet 3   pantoprazole (PROTONIX) 40 MG tablet TAKE 1 TABLET (40 MG TOTAL) BY MOUTH  DAILY. TAKE 30-60 MIN BEFORE FIRST MEAL OF THE DAY 90 tablet 0   tamsulosin (FLOMAX) 0.4 MG CAPS capsule Take 0.4 mg by mouth daily.     vitamin B-12 (CYANOCOBALAMIN) 1000 MCG tablet Take 1,000 mcg by mouth daily.     donepezil (ARICEPT) 5 MG tablet Take 5 mg by mouth at bedtime.     donepezil (ARICEPT) 10 MG tablet Take 10 mg by mouth every morning.     potassium chloride SA (KLOR-CON) 20 MEQ tablet Take 1 tablet (20 mEq total) by mouth 2 (two) times daily. (Patient not taking: Reported on 01/03/2022) 8 tablet 0   No facility-administered medications prior to visit.     Allergies:   Patient has no known allergies.   Social History   Socioeconomic History   Marital status: Widowed    Spouse name: Not on file   Number of children: 2   Years of education: Not on file   Highest education level: Not on file  Occupational History   Occupation: retired; Print production planner  Tobacco Use   Smoking status: Every Day    Packs/day: 1.00    Years: 65.00    Total pack years: 65.00    Types: Cigarettes    Passive exposure: Never   Smokeless tobacco: Never  Vaping Use   Vaping Use: Never used  Substance and Sexual Activity   Alcohol use: Not Currently    Alcohol/week: 0.0 standard drinks of alcohol   Drug use: Never   Sexual activity: Not Currently  Other Topics Concern   Not on file  Social History Narrative   Widower (married for 60yr); Remarried in 2016      Social Determinants of Health   Financial Resource Strain: Not on file  Food Insecurity: Not on file  Transportation Needs: Not on file  Physical Activity: Not on file  Stress: Not on file  Social Connections: Not on file     Family History:  The patient's family history includes Heart attack in his father; Liver disease in his sister; Renal Disease in his brother; Stroke in his mother.   Review of Systems:    Please see the history of present illness.     All other systems reviewed and are otherwise negative except as  noted above.   Physical Exam:    VS:  BP 126/70 (BP Location: Left Arm, Patient Position: Sitting, Cuff Size: Normal)   Pulse 69   Ht '5\' 6"'$  (1.676 m)   Wt 126 lb 9.6 oz (57.4 kg)   SpO2 95%   BMI 20.43 kg/m    General: Pleasant elderly male appearing in no acute distress. Head: Normocephalic, atraumatic. Neck: No carotid bruits. JVD not elevated.  Lungs: Respirations regular and unlabored, without wheezes or rales.  Heart: Regular rate and rhythm. No S3 or S4. 2/6 diastolic murmur along RUSB.  Abdomen: Appears non-distended. No obvious abdominal masses. Msk:  Strength and tone appear normal for age. No obvious joint deformities or effusions. Extremities: No clubbing  or cyanosis. No pitting edema.  Distal pedal pulses are 2+ bilaterally. Neuro: Alert and oriented X 3. Moves all extremities spontaneously. No focal deficits noted. Psych:  Responds to questions appropriately with a normal affect. Skin: No rashes or lesions noted  Wt Readings from Last 3 Encounters:  05/08/22 126 lb 9.6 oz (57.4 kg)  01/04/22 132 lb (59.9 kg)  07/16/21 142 lb (64.4 kg)     Studies/Labs Reviewed:   EKG:  EKG is not ordered today.    Recent Labs: 07/11/2021: ALT 50 07/16/2021: BUN 12; Creatinine, Ser 0.84; Hemoglobin 13.9; Platelets 170; Potassium 3.7; Sodium 140   Lipid Panel    Component Value Date/Time   TRIG 62 02/05/2021 1156    Additional studies/ records that were reviewed today include:   R/LHC: 02/2018 Ost RCA lesion is 40% stenosed. Prox RCA to Dist RCA lesion is 20% stenosed. 1st RPLB lesion is 20% stenosed. Prox Cx to Mid Cx lesion is 30% stenosed. 3rd Mrg lesion is 30% stenosed. Dist LAD lesion is 70% stenosed. Ost LAD to Prox LAD lesion is 30% stenosed. Dist LM to Ost LAD lesion is 20% stenosed. LV end diastolic pressure is normal.   1. Mild non-obstructive CAD 2. Severe aortic valve stenosis (peak to peak gradient 43 mmHg, mean gradient 38 mmHg, AVA 0.70cm2)    Continue planning for TAVR.   Echocardiogram: 04/2021 IMPRESSIONS     1. Left ventricular ejection fraction, by estimation, is 60 to 65%. The  left ventricle has normal function. The left ventricle has no regional  wall motion abnormalities. Left ventricular diastolic parameters are  consistent with Grade I diastolic  dysfunction (impaired relaxation).   2. Right ventricular systolic function is normal. The right ventricular  size is normal. Tricuspid regurgitation signal is inadequate for assessing  PA pressure.   3. Left atrial size was mildly dilated.   4. The mitral valve is grossly normal. Mild mitral valve regurgitation.   5. The aortic valve has been repaired/replaced. Aortic valve  regurgitation is mild to moderate. There is a 26 mm Sapien prosthetic  (TAVR) valve present in the aortic position. Aortic regurgitation PHT  measures 404 msec. Aortic valve mean gradient  measures 12.0 mmHg.   6. Unable to estimate CVP.    Assessment:    1. Aortic valve stenosis, etiology of cardiac valve disease unspecified   2. Coronary artery disease involving native coronary artery of native heart without angina pectoris   3. Essential hypertension   4. Aneurysm of ascending aorta without rupture ([place])   5. Pulmonary emphysema, unspecified emphysema type ([place])      Plan:   In order of problems listed above:  1. Severe AS - He underwent TAVR in 03/2018 and most recent echocardiogram in 04/2021 showed mild to moderate aortic valve regurgitation. Will arrange for a repeat echocardiogram for reassessment of his aortic valve.   2. CAD - Prior catheterization in 02/2018 showed mild nonobstructive disease. He denies any recent anginal symptoms. Continue current medical therapy with ASA 81 mg daily and Atorvastatin 10 mg daily.  3. HTN - His blood pressure is well-controlled at 126/70 during today's visit. Continue current medical therapy with Irbesartan 150 mg daily.  4. Ascending  Thoracic Aortic Aneurysm - CT in 07/2021 showed his aneurysm remained stable at 42 mm with follow-up imaging recommended in 1 year. Will arrange for repeat imaging in 07/2022.   5. COPD - He continues to smoke 1 ppd and did have emphysema by prior CT.  Encouraged him to use his Albuterol if needed and to try reducing his tobacco use.   Medication Adjustments/Labs and Tests Ordered: Current medicines are reviewed at length with the patient today.  Concerns regarding medicines are outlined above.  Medication changes, Labs and Tests ordered today are listed in the Patient Instructions below. Patient Instructions  Medication Instructions:  Your physician recommends that you continue on your current medications as directed. Please refer to the Current Medication list given to you today.  *If you need a refill on your cardiac medications before your next appointment, please call your pharmacy*   Lab Work: Your physician recommends that you return for lab work in: December just before CTA scan  If you have labs (blood work) drawn today and your tests are completely normal, you will receive your results only by: [employer] (if you have MyChart) OR A paper copy in the mail If you have any lab test that is abnormal or we need to change your treatment, we will call you to review the results.   Testing/Procedures: Non-Cardiac CT Angiography (CTA), is a special type of CT scan that uses a computer to produce multi-dimensional views of major blood vessels throughout the body. In CT angiography, a contrast material is injected through an IV to help visualize the blood vessels   Your physician has requested that you have an echocardiogram. Echocardiography is a painless test that uses sound waves to create images of your heart. It provides your doctor with information about the size and shape of your heart and how well your heart's chambers and valves are working. This procedure takes approximately  one hour. There are no restrictions for this procedure.    Follow-Up: At [facility], you and your health needs are our priority.  As part of our continuing mission to provide you with exceptional heart care, we have created designated Provider Care Teams.  These Care Teams include your primary Cardiologist (physician) and Advanced Practice Providers (APPs -  Physician Assistants and Nurse Practitioners) who all work together to provide you with the care you need, when you need it.  We recommend signing up for the patient portal called "MyChart".  Sign up information is provided on this After Visit Summary.  MyChart is used to connect with patients for Virtual Visits (Telemedicine).  Patients are able to view lab/test results, encounter notes, upcoming appointments, etc.  Non-urgent messages can be sent to your provider as well.   To learn more about what you can do with MyChart, go to [email or web address].    Your next appointment:   6 month(s)  The format for your next appointment:   In Person  Provider:   You may see Rozann Lesches, MD or one of the following Advanced Practice Providers on your designated Care Team:   Bernerd Pho, PA-C  Ermalinda Barrios, PA-C     Other Instructions Thank you for choosing [facility]!    Important Information About Sugar         Signed, Erma Heritage, PA-C  05/08/2022 4:43 PM    [place] [address] [place], [place] [address] Phone: [phone] Fax: [phone]

## 2022-05-08 ENCOUNTER — Ambulatory Visit: Payer: Medicare Other | Attending: Student | Admitting: Student

## 2022-05-08 ENCOUNTER — Encounter: Payer: Self-pay | Admitting: Student

## 2022-05-08 VITALS — BP 126/70 | HR 69 | Ht 66.0 in | Wt 126.6 lb

## 2022-05-08 DIAGNOSIS — J439 Emphysema, unspecified: Secondary | ICD-10-CM

## 2022-05-08 DIAGNOSIS — I350 Nonrheumatic aortic (valve) stenosis: Secondary | ICD-10-CM

## 2022-05-08 DIAGNOSIS — I2510 Atherosclerotic heart disease of native coronary artery without angina pectoris: Secondary | ICD-10-CM

## 2022-05-08 DIAGNOSIS — I7121 Aneurysm of the ascending aorta, without rupture: Secondary | ICD-10-CM

## 2022-05-08 DIAGNOSIS — I10 Essential (primary) hypertension: Secondary | ICD-10-CM | POA: Diagnosis not present

## 2022-05-08 NOTE — Patient Instructions (Signed)
Medication Instructions:  Your physician recommends that you continue on your current medications as directed. Please refer to the Current Medication list given to you today.  *If you need a refill on your cardiac medications before your next appointment, please call your pharmacy*   Lab Work: Your physician recommends that you return for lab work in: December just before CTA scan  If you have labs (blood work) drawn today and your tests are completely normal, you will receive your results only by: [employer] (if you have MyChart) OR A paper copy in the mail If you have any lab test that is abnormal or we need to change your treatment, we will call you to review the results.   Testing/Procedures: Non-Cardiac CT Angiography (CTA), is a special type of CT scan that uses a computer to produce multi-dimensional views of major blood vessels throughout the body. In CT angiography, a contrast material is injected through an IV to help visualize the blood vessels   Your physician has requested that you have an echocardiogram. Echocardiography is a painless test that uses sound waves to create images of your heart. It provides your doctor with information about the size and shape of your heart and how well your heart's chambers and valves are working. This procedure takes approximately one hour. There are no restrictions for this procedure.    Follow-Up: At [facility], you and your health needs are our priority.  As part of our continuing mission to provide you with exceptional heart care, we have created designated Provider Care Teams.  These Care Teams include your primary Cardiologist (physician) and Advanced Practice Providers (APPs -  Physician Assistants and Nurse Practitioners) who all work together to provide you with the care you need, when you need it.  We recommend signing up for the patient portal called "MyChart".  Sign up information is provided on this After Visit  Summary.  MyChart is used to connect with patients for Virtual Visits (Telemedicine).  Patients are able to view lab/test results, encounter notes, upcoming appointments, etc.  Non-urgent messages can be sent to your provider as well.   To learn more about what you can do with MyChart, go to [email or web address].    Your next appointment:   6 month(s)  The format for your next appointment:   In Person  Provider:   You may see Rozann Lesches, MD or one of the following Advanced Practice Providers on your designated Care Team:   Bernerd Pho, PA-C  Ermalinda Barrios, PA-C     Other Instructions Thank you for choosing [facility]!    Important Information About Sugar

## 2022-05-14 DIAGNOSIS — Z23 Encounter for immunization: Secondary | ICD-10-CM | POA: Diagnosis not present

## 2022-05-25 DIAGNOSIS — E1129 Type 2 diabetes mellitus with other diabetic kidney complication: Secondary | ICD-10-CM | POA: Diagnosis not present

## 2022-05-25 DIAGNOSIS — I10 Essential (primary) hypertension: Secondary | ICD-10-CM | POA: Diagnosis not present

## 2022-05-25 DIAGNOSIS — E785 Hyperlipidemia, unspecified: Secondary | ICD-10-CM | POA: Diagnosis not present

## 2022-05-27 ENCOUNTER — Other Ambulatory Visit: Payer: Self-pay | Admitting: Internal Medicine

## 2022-06-10 IMAGING — CT CT ANGIO CHEST
2 of 6 series · 16 of 36 positions shown · IV contrast (Omnipaque or Isovue)
Comparison: 08/07/2020

CLINICAL DATA: Thoracic aortic aneurysm, status post previous TAVR

EXAM:
CT ANGIOGRAPHY CHEST WITH CONTRAST
TECHNIQUE: Multidetector CT imaging of the chest was performed using the
standard protocol during bolus administration of intravenous
contrast. Multiplanar CT image reconstructions and MIPs were
obtained to evaluate the vascular anatomy.
CONTRAST:  75mL OMNIPAQUE IOHEXOL 350 MG/ML SOLN

[Series 7: lungs · axial · 0.68mm/px · z∈[-174,+96]mm · 15 of 155 slices shown]
[im 10/155  lung]
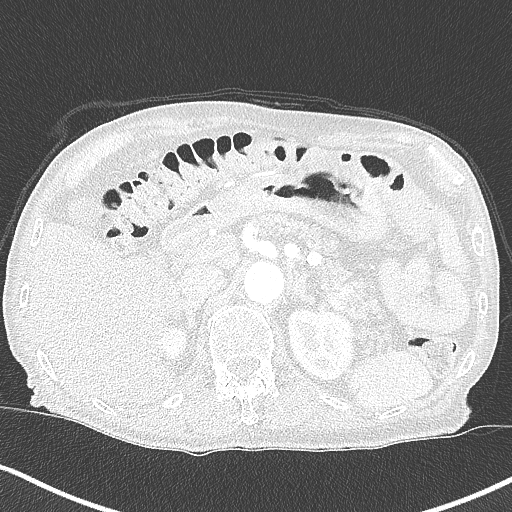
[im 20/155  mediastinal]
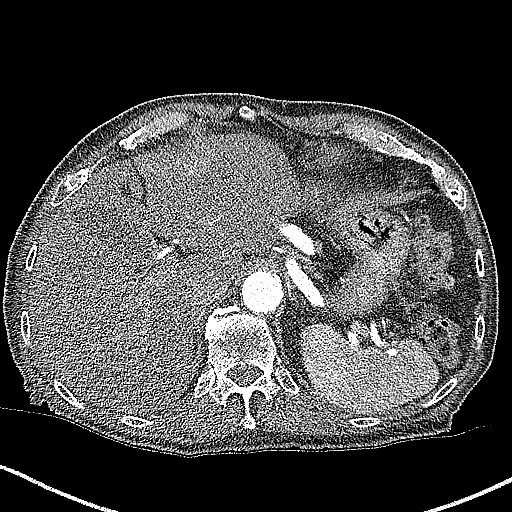
[im 29/155  lung]
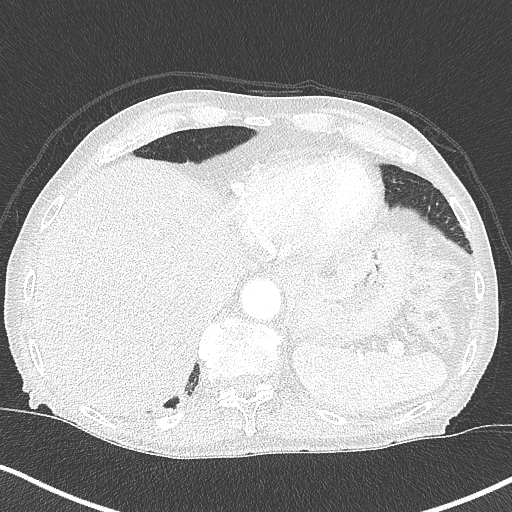
[im 39/155  mediastinal]
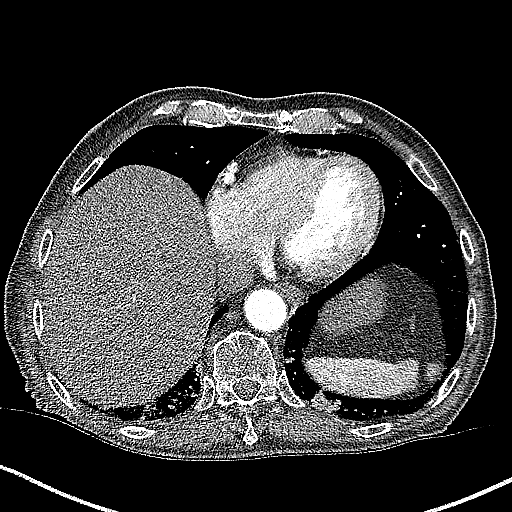
[im 49/155  lung]
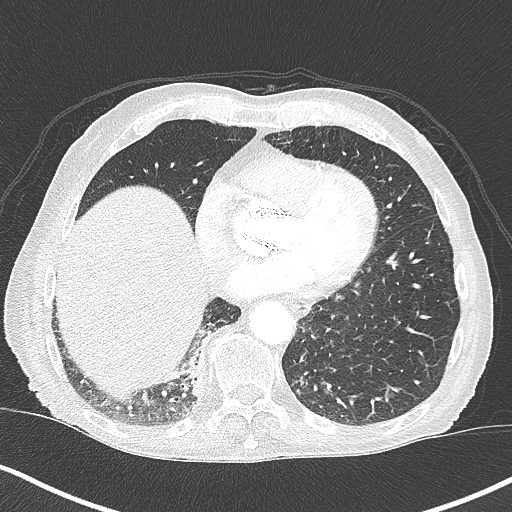
[im 58/155  mediastinal]
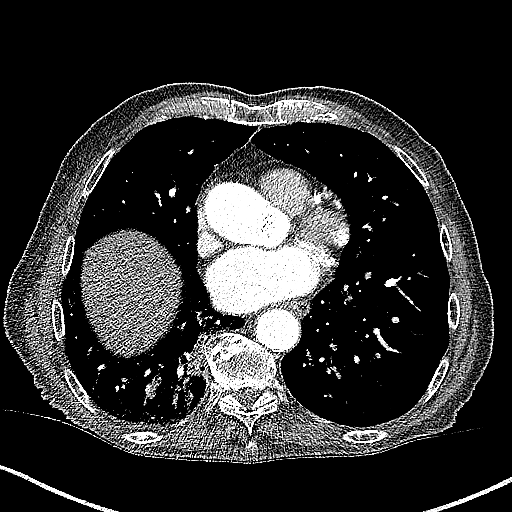
[im 68/155  lung]
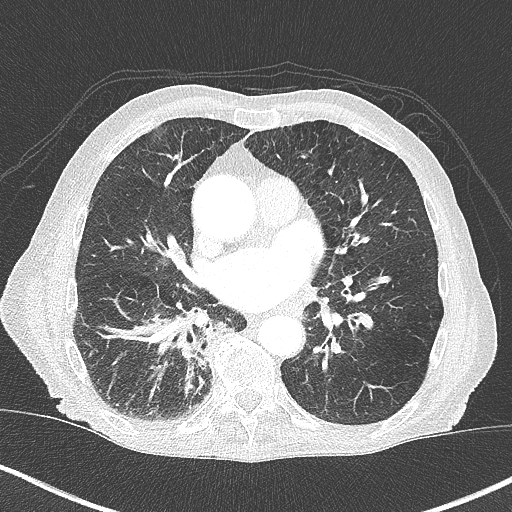
[im 78/155  mediastinal]
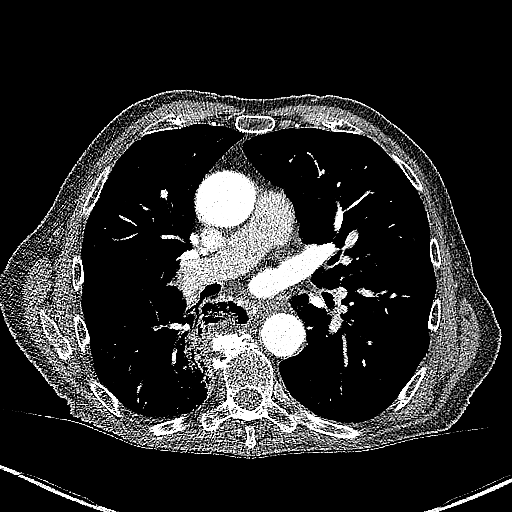
[im 87/155  lung]
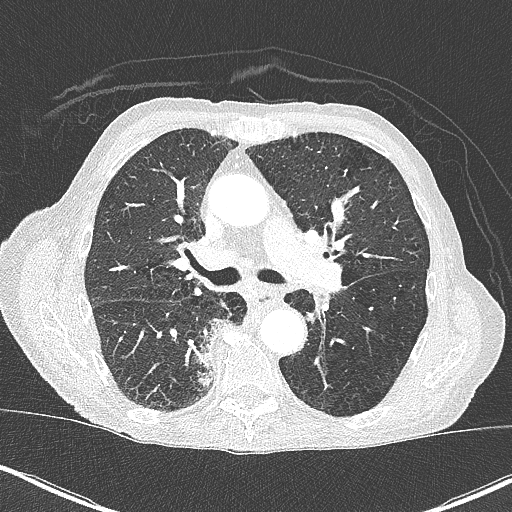
[im 97/155  mediastinal]
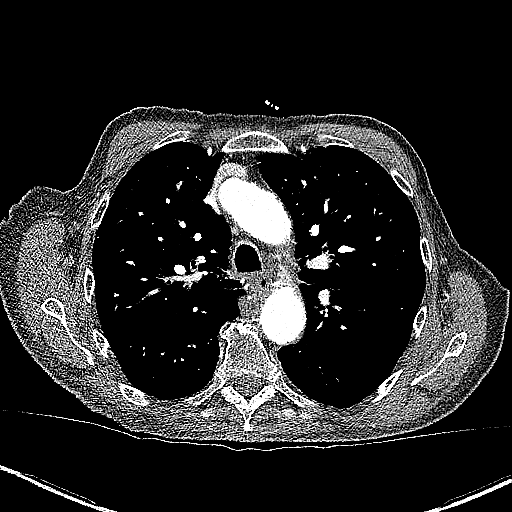
[im 106/155  lung]
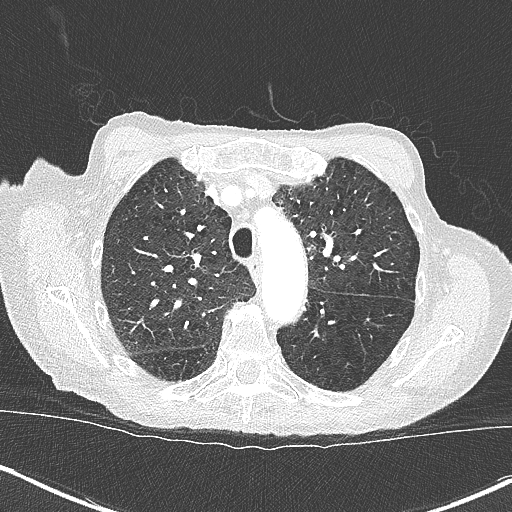
[im 116/155  mediastinal]
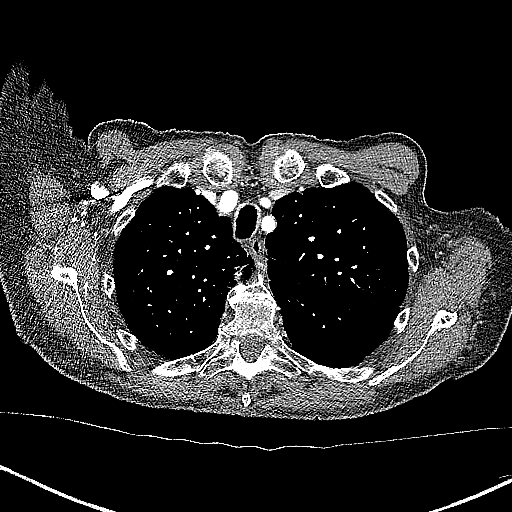
[im 126/155  lung]
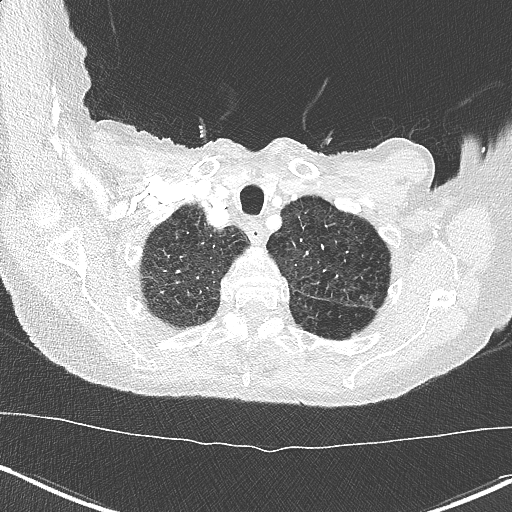
[im 135/155  mediastinal]
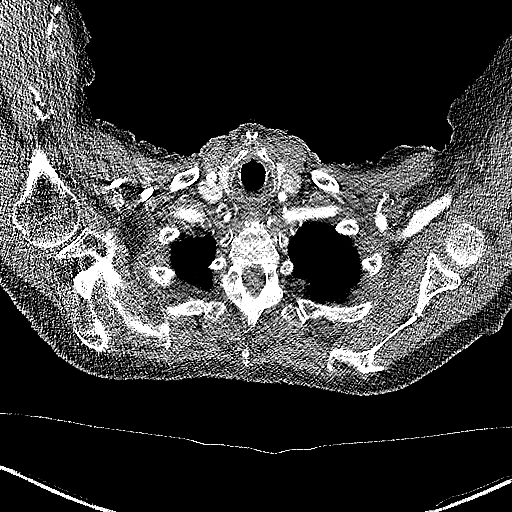
[im 145/155  lung]
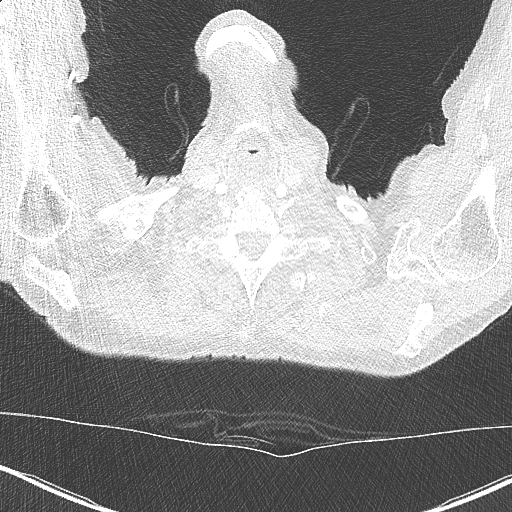

[Series 8: cor soft · coronal · 0.63mm/px · 1 of 143 slices shown]
[im 72/143  mediastinal]
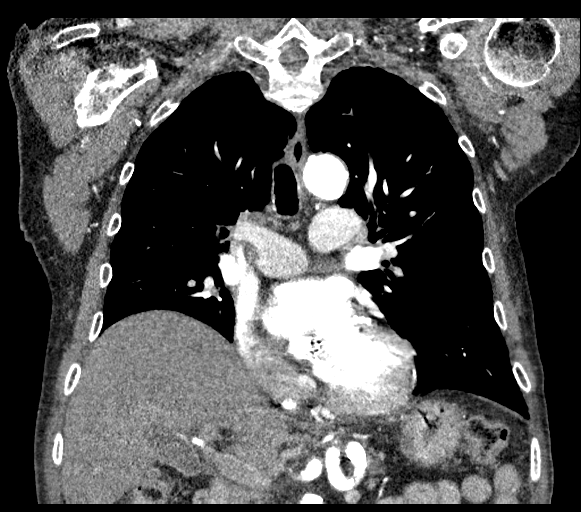

[16 of 36 positions shown; findings below may reference images not displayed]

FINDINGS: Cardiovascular: Stable mild fusiform aneurysmal dilatation of the
ascending thoracic aorta, maximal diameter remains 42 mm. Similar
atherosclerotic change. Patent 3 vessel arch anatomy. Remainder of
the thoracic aorta is normal in caliber. No acute aortic process,
dissection, or intramural hematoma. No mediastinal hemorrhage or
hematoma.

Limited assessment of the pulmonary arteries. Normal heart size.
Native coronary atherosclerosis. Previous TAVR noted.

Central venous structures are patent.  No Desordi process.

Mediastinum/Nodes: No enlarged mediastinal, hilar, or axillary lymph
nodes. Thyroid gland, trachea, and esophagus demonstrate no
significant findings.

Lungs/Pleura: Stable moderately severe centrilobular emphysema
pattern throughout both lungs. Similar scattered areas of subpleural
parenchymal scarring in the lower lobes. Minor dependent basilar
atelectasis. No acute airspace process, significant collapse or
consolidation. Negative for edema or interstitial process.

No pleural abnormality, effusion, or pneumothorax.

Upper Abdomen: Abdominal atherosclerosis noted. No acute upper
abdominal finding. Colonic diverticulosis present.

Musculoskeletal: Degenerative changes of the spine with large
osteophytes on the right throughout the thoracic spine. Slight
exaggerated thoracic kyphosis. No severe compression fracture.
Sternum intact.

Review of the MIP images confirms the above findings.
IMPRESSION: Stable 42 mm aneurysm of the ascending thoracic aorta.

Recommend annual imaging followup by CTA or MRA. This recommendation
follows 6555 ACCF/AHA/AATS/ACR/ASA/SCA/TICA/OSCAR GUADALUPE/[place]/PAC Guidelines
for the Diagnosis and Management of Patients with Thoracic Aortic
Disease. Circulation. 6555; 121: E266-e369. Aortic aneurysm NOS
(K9X9A-YCU.E)

No other acute intrathoracic finding.

Native coronary atherosclerosis

Aortic Atherosclerosis (K9X9A-6KG.G) and Emphysema (K9X9A-K1I.N).

## 2022-06-26 DIAGNOSIS — J449 Chronic obstructive pulmonary disease, unspecified: Secondary | ICD-10-CM | POA: Diagnosis not present

## 2022-06-26 DIAGNOSIS — F01A0 Vascular dementia, mild, without behavioral disturbance, psychotic disturbance, mood disturbance, and anxiety: Secondary | ICD-10-CM | POA: Diagnosis not present

## 2022-07-09 ENCOUNTER — Telehealth: Payer: Self-pay | Admitting: Cardiology

## 2022-07-09 NOTE — Telephone Encounter (Signed)
Pt c/o medication issue:  1. Name of Medication: potassium chloride SA (KLOR-CON) 20 MEQ tablet   2. How are you currently taking this medication (dosage and times per day)?   3. Are you having a reaction (difficulty breathing--STAT)?   4. What is your medication issue? Pt's daughter would like a call back to see if this medication is something the patient should still be taking. Requesting call back.

## 2022-07-09 NOTE — Telephone Encounter (Signed)
Patient was prescribed medication while in the hospital. Medication was for short term use (8 days). Pt's daughter notified and verbalized understanding and will not request a refill of this medication at this time.

## 2022-07-26 ENCOUNTER — Ambulatory Visit: Payer: Self-pay | Admitting: *Deleted

## 2022-07-26 DIAGNOSIS — F01A0 Vascular dementia, mild, without behavioral disturbance, psychotic disturbance, mood disturbance, and anxiety: Secondary | ICD-10-CM | POA: Diagnosis not present

## 2022-07-26 DIAGNOSIS — J449 Chronic obstructive pulmonary disease, unspecified: Secondary | ICD-10-CM | POA: Diagnosis not present

## 2022-07-27 ENCOUNTER — Encounter: Payer: Self-pay | Admitting: *Deleted

## 2022-07-27 NOTE — Patient Instructions (Signed)
Visit Information  Thank you for taking time to visit with me today. Please don't hesitate to contact me if I can be of assistance to you.   Please call the care guide team at [phone] if you need to cancel or reschedule your appointment.   If you are experiencing a Mental Health or Behavioral Health Crisis or need someone to talk to, please call the Suicide and Crisis Lifeline: 988 call the [place] National Suicide Prevention Lifeline: 1-800-273-8255 or TTY: 1-800-799-4 TTY (1-800-799-4889) to talk to a trained counselor call 1-800-273-TALK (toll free, 24 hour hotline) go to [facility] [address], [place] ([phone]) call the [place] Crisis Line: 800-939-9988 call 911  Patient verbalizes understanding of instructions and care plan provided today and agrees to view in MyChart. Active MyChart status and patient understanding of how to access instructions and care plan via MyChart confirmed with patient.     No further follow up required.  Jamyla Ard, BSW, MSW, LCSW  Licensed Clinical Social Worker  [facility] [place] [facility]  Mailing Address-[address], [place], [place] [address] Physical Address-[address], [place], [place] [address] Toll Free Main # [phone] Fax # [phone] Cell # [phone] [email or web address]            

## 2022-07-27 NOTE — Patient Outreach (Signed)
  Care Coordination   Initial Visit Note   07/27/2022  Name: Travis Murphy MRN: [id number] DOB: 09-Aug-1935  Travis Murphy is a 87 y.o. year old male who sees Asencion Noble, MD for primary care. I spoke with Brand Males and daughter, [name] by phone today.  What matters to the patients health and wellness today?  No Interventions Identified.   SDOH assessments and interventions completed:  Yes.  SDOH Interventions Today    Flowsheet Row Most Recent Value  SDOH Interventions   Food Insecurity Interventions Intervention Not Indicated  [Verified by Daughter - [place] Interventions Intervention Not Indicated  [Verified by Daughter - Helene Kelp Binkley]  Transportation Interventions Intervention Not Indicated, Patient Resources (Friends/Family)  [Verified by Daughter - Helene Kelp Binkley]  Utilities Interventions Intervention Not Indicated  [Verified by Daughter - Helene Kelp Binkley]  Alcohol Usage Interventions Intervention Not Indicated (Score <7)  [Verified by Daughter - Helene Kelp Binkley]  Financial Strain Interventions Intervention Not Indicated  [Verified by Daughter - Helene Kelp Binkley]  Physical Activity Interventions Intervention Not Indicated  [Verified by Daughter - Helene Kelp Binkley]  Stress Interventions Intervention Not Indicated  [Verified by Daughter - Helene Kelp Binkley]  Social Connections Interventions Intervention Not Indicated  [Verified by Daughter - [place] Coordination Interventions:  Yes, provided.   Follow up plan: No further intervention required.   Encounter Outcome:  Pt. Visit Completed.   Nat Christen, BSW, MSW, LCSW  Licensed Education officer, [facility]  Mailing [place] [address] [place], [place] [address] Physical Address-[address] [place], [place] [address] Toll Free Main # [phone] Fax # [phone] Cell #  [phone] [email or web address]

## 2022-08-07 DIAGNOSIS — I10 Essential (primary) hypertension: Secondary | ICD-10-CM | POA: Diagnosis not present

## 2022-08-07 DIAGNOSIS — Z79899 Other long term (current) drug therapy: Secondary | ICD-10-CM | POA: Diagnosis not present

## 2022-08-14 ENCOUNTER — Ambulatory Visit (HOSPITAL_BASED_OUTPATIENT_CLINIC_OR_DEPARTMENT_OTHER)
Admission: RE | Admit: 2022-08-14 | Discharge: 2022-08-14 | Disposition: A | Discharge status: Home / Self Care | Payer: Medicare Other | Source: Ambulatory Visit | Attending: Student | Admitting: Student

## 2022-08-14 ENCOUNTER — Ambulatory Visit (HOSPITAL_COMMUNITY)
Admission: RE | Admit: 2022-08-14 | Discharge: 2022-08-14 | Disposition: A | Discharge status: Home / Self Care | Payer: Medicare Other | Source: Ambulatory Visit | Attending: Student | Admitting: Student

## 2022-08-14 DIAGNOSIS — I350 Nonrheumatic aortic (valve) stenosis: Secondary | ICD-10-CM | POA: Diagnosis not present

## 2022-08-14 DIAGNOSIS — I7121 Aneurysm of the ascending aorta, without rupture: Secondary | ICD-10-CM | POA: Diagnosis not present

## 2022-08-14 DIAGNOSIS — J439 Emphysema, unspecified: Secondary | ICD-10-CM | POA: Diagnosis not present

## 2022-08-14 DIAGNOSIS — Z952 Presence of prosthetic heart valve: Secondary | ICD-10-CM | POA: Diagnosis not present

## 2022-08-14 DIAGNOSIS — E1122 Type 2 diabetes mellitus with diabetic chronic kidney disease: Secondary | ICD-10-CM | POA: Diagnosis not present

## 2022-08-14 DIAGNOSIS — J9811 Atelectasis: Secondary | ICD-10-CM | POA: Diagnosis not present

## 2022-08-14 LAB — ECHOCARDIOGRAM COMPLETE
AR max vel: 1.13 cm2
AV Area VTI: 1.2 cm2
AV Area mean vel: 1.26 cm2
AV Mean grad: 9.5 mmHg
AV Peak grad: 21.2 mmHg
Ao pk vel: 2.3 m/s
Area-P 1/2: 2.09 cm2
P 1/2 time: 500 msec
S' Lateral: 2.4 cm

## 2022-08-14 MED ORDER — IOHEXOL 350 MG/ML SOLN
80.0000 mL | Freq: Once | INTRAVENOUS | Status: AC | PRN
  Administered 2022-08-14: 80 mL via INTRAVENOUS

## 2022-08-14 NOTE — Progress Notes (Signed)
*  PRELIMINARY RESULTS* Echocardiogram 2D Echocardiogram has been performed.  Travis Murphy, Travis Murphy 08/14/2022, 12:38 PM

## 2022-08-15 ENCOUNTER — Telehealth: Payer: Self-pay

## 2022-08-15 NOTE — Telephone Encounter (Signed)
Patients daughter, okay per DPR, notified and verbalized understanding of results. Patients daughter had no questions or concerns at this time. PCP copied.

## 2022-08-15 NOTE — Telephone Encounter (Signed)
-----   Message from Theora Gianotti, NP sent at 08/14/2022  4:06 PM EST ----- Unchanged dilated aorta at 4.2 cm.  Evidence of emphysema and nonspecific enlarged lymph nodes throughout the chest.  He will need follow-up CT angio of the chest and aorta with and without contrast in 1 year.

## 2022-08-15 NOTE — Telephone Encounter (Signed)
-----   Message from Theora Gianotti, NP sent at 08/14/2022  4:08 PM EST ----- Normal heart squeezing function with moderately stiffened heart muscle.  We managed the heart muscle through good heart rate and blood pressure control.  Both of which looked good at his last visit in September.  The mitral valve is mildly leaky.  The prosthetic aortic valve is mildly to moderately leaky, which is similar to last year.  Overall reassuring echo.

## 2022-08-24 ENCOUNTER — Inpatient Hospital Stay (HOSPITAL_COMMUNITY)
Admission: EM | Admit: 2022-08-24 | Discharge: 2022-08-27 | DRG: 193 | Disposition: A | Discharge status: Home Health | Payer: Medicare Other | Attending: Family Medicine | Admitting: Family Medicine

## 2022-08-24 ENCOUNTER — Other Ambulatory Visit: Payer: Self-pay

## 2022-08-24 ENCOUNTER — Emergency Department (HOSPITAL_COMMUNITY): Payer: Medicare Other

## 2022-08-24 ENCOUNTER — Encounter (HOSPITAL_COMMUNITY): Payer: Self-pay | Admitting: Emergency Medicine

## 2022-08-24 DIAGNOSIS — J205 Acute bronchitis due to respiratory syncytial virus: Secondary | ICD-10-CM | POA: Diagnosis present

## 2022-08-24 DIAGNOSIS — M1990 Unspecified osteoarthritis, unspecified site: Secondary | ICD-10-CM | POA: Diagnosis not present

## 2022-08-24 DIAGNOSIS — J449 Chronic obstructive pulmonary disease, unspecified: Secondary | ICD-10-CM | POA: Diagnosis present

## 2022-08-24 DIAGNOSIS — J9601 Acute respiratory failure with hypoxia: Secondary | ICD-10-CM | POA: Diagnosis not present

## 2022-08-24 DIAGNOSIS — K219 Gastro-esophageal reflux disease without esophagitis: Secondary | ICD-10-CM | POA: Diagnosis not present

## 2022-08-24 DIAGNOSIS — I5032 Chronic diastolic (congestive) heart failure: Secondary | ICD-10-CM | POA: Diagnosis not present

## 2022-08-24 DIAGNOSIS — Z823 Family history of stroke: Secondary | ICD-10-CM

## 2022-08-24 DIAGNOSIS — Z1152 Encounter for screening for COVID-19: Secondary | ICD-10-CM

## 2022-08-24 DIAGNOSIS — R0602 Shortness of breath: Secondary | ICD-10-CM | POA: Diagnosis not present

## 2022-08-24 DIAGNOSIS — J441 Chronic obstructive pulmonary disease with (acute) exacerbation: Secondary | ICD-10-CM | POA: Diagnosis not present

## 2022-08-24 DIAGNOSIS — Z952 Presence of prosthetic heart valve: Secondary | ICD-10-CM

## 2022-08-24 DIAGNOSIS — I4891 Unspecified atrial fibrillation: Secondary | ICD-10-CM | POA: Diagnosis not present

## 2022-08-24 DIAGNOSIS — J121 Respiratory syncytial virus pneumonia: Secondary | ICD-10-CM | POA: Diagnosis present

## 2022-08-24 DIAGNOSIS — E785 Hyperlipidemia, unspecified: Secondary | ICD-10-CM | POA: Diagnosis not present

## 2022-08-24 DIAGNOSIS — F17210 Nicotine dependence, cigarettes, uncomplicated: Secondary | ICD-10-CM | POA: Diagnosis present

## 2022-08-24 DIAGNOSIS — Z79899 Other long term (current) drug therapy: Secondary | ICD-10-CM

## 2022-08-24 DIAGNOSIS — Z7982 Long term (current) use of aspirin: Secondary | ICD-10-CM

## 2022-08-24 DIAGNOSIS — D696 Thrombocytopenia, unspecified: Secondary | ICD-10-CM | POA: Diagnosis present

## 2022-08-24 DIAGNOSIS — I110 Hypertensive heart disease with heart failure: Secondary | ICD-10-CM | POA: Diagnosis not present

## 2022-08-24 DIAGNOSIS — R7989 Other specified abnormal findings of blood chemistry: Secondary | ICD-10-CM | POA: Clinically undetermined

## 2022-08-24 DIAGNOSIS — I10 Essential (primary) hypertension: Secondary | ICD-10-CM | POA: Diagnosis present

## 2022-08-24 DIAGNOSIS — Z841 Family history of disorders of kidney and ureter: Secondary | ICD-10-CM

## 2022-08-24 DIAGNOSIS — B338 Other specified viral diseases: Secondary | ICD-10-CM | POA: Diagnosis present

## 2022-08-24 DIAGNOSIS — F0150 Vascular dementia without behavioral disturbance: Secondary | ICD-10-CM | POA: Diagnosis present

## 2022-08-24 DIAGNOSIS — Z8249 Family history of ischemic heart disease and other diseases of the circulatory system: Secondary | ICD-10-CM | POA: Diagnosis not present

## 2022-08-24 DIAGNOSIS — J440 Chronic obstructive pulmonary disease with acute lower respiratory infection: Secondary | ICD-10-CM | POA: Diagnosis not present

## 2022-08-24 DIAGNOSIS — Z953 Presence of xenogenic heart valve: Secondary | ICD-10-CM

## 2022-08-24 DIAGNOSIS — Z66 Do not resuscitate: Secondary | ICD-10-CM | POA: Diagnosis present

## 2022-08-24 LAB — BASIC METABOLIC PANEL
Anion gap: 5 (ref 5–15)
BUN: 17 mg/dL (ref 8–23)
CO2: 27 mmol/L (ref 22–32)
Calcium: 7.8 mg/dL — ABNORMAL LOW (ref 8.9–10.3)
Chloride: 103 mmol/L (ref 98–111)
Creatinine, Ser: 0.93 mg/dL (ref 0.61–1.24)
GFR, Estimated: >60 mL/min (ref >60)
Glucose, Bld: 83 mg/dL (ref 70–99)
Potassium: 3.5 mmol/L (ref 3.5–5.1)
Sodium: 135 mmol/L (ref 135–145)

## 2022-08-24 LAB — RESP PANEL BY RT-PCR (RSV, FLU A&B, COVID)  RVPGX2
Influenza A by PCR: NEGATIVE
Influenza B by PCR: NEGATIVE
Resp Syncytial Virus by PCR: POSITIVE — AB
SARS Coronavirus 2 by RT PCR: NEGATIVE

## 2022-08-24 LAB — CBC WITH DIFFERENTIAL/PLATELET
Abs Immature Granulocytes: 0.05 10*3/uL (ref 0.00–0.07)
Basophils Absolute: 0 10*3/uL (ref 0.0–0.1)
Basophils Relative: 0 %
Eosinophils Absolute: 0.2 10*3/uL (ref 0.0–0.5)
Eosinophils Relative: 3 %
HCT: 39.4 % (ref 39.0–52.0)
Hemoglobin: 13.2 g/dL (ref 13.0–17.0)
Immature Granulocytes: 1 %
Lymphocytes Relative: 18 %
Lymphs Abs: 1.3 10*3/uL (ref 0.7–4.0)
MCH: 31.9 pg (ref 26.0–34.0)
MCHC: 33.5 g/dL (ref 30.0–36.0)
MCV: 95.2 fL (ref 80.0–100.0)
Monocytes Absolute: 0.8 10*3/uL (ref 0.1–1.0)
Monocytes Relative: 11 %
Neutro Abs: 4.8 10*3/uL (ref 1.7–7.7)
Neutrophils Relative %: 67 %
Platelets: 123 10*3/uL — ABNORMAL LOW (ref 150–400)
RBC: 4.14 MIL/uL — ABNORMAL LOW (ref 4.22–5.81)
RDW: 13.9 % (ref 11.5–15.5)
WBC: 7.1 10*3/uL (ref 4.0–10.5)
nRBC: 0 % (ref 0.0–0.2)

## 2022-08-24 LAB — PROCALCITONIN: Procalcitonin: <0.1 ng/mL

## 2022-08-24 MED ORDER — UMECLIDINIUM BROMIDE 62.5 MCG/ACT IN AEPB
1.0000 | INHALATION_SPRAY | Freq: Every day | RESPIRATORY_TRACT | Status: DC
  Administered 2022-08-25 – 2022-08-27 (×3): 1 via RESPIRATORY_TRACT
  Filled 2022-08-24: qty 7

## 2022-08-24 MED ORDER — ENOXAPARIN SODIUM 40 MG/0.4ML IJ SOSY
40.0000 mg | PREFILLED_SYRINGE | INTRAMUSCULAR | Status: DC
  Administered 2022-08-25 – 2022-08-27 (×3): 40 mg via SUBCUTANEOUS
  Filled 2022-08-24 (×3): qty 0.4

## 2022-08-24 MED ORDER — MOMETASONE FURO-FORMOTEROL FUM 200-5 MCG/ACT IN AERO
2.0000 | INHALATION_SPRAY | Freq: Two times a day (BID) | RESPIRATORY_TRACT | Status: DC
  Administered 2022-08-25 – 2022-08-27 (×5): 2 via RESPIRATORY_TRACT
  Filled 2022-08-24: qty 8.8

## 2022-08-24 MED ORDER — ALBUTEROL SULFATE (2.5 MG/3ML) 0.083% IN NEBU
2.5000 mg | INHALATION_SOLUTION | RESPIRATORY_TRACT | Status: DC | PRN
  Administered 2022-08-25 – 2022-08-26 (×3): 2.5 mg via RESPIRATORY_TRACT
  Filled 2022-08-24 (×3): qty 3

## 2022-08-24 NOTE — ED Provider Notes (Signed)
[facility] EMERGENCY DEPARTMENT Provider Note   CSN: [id number] Arrival date & time: 08/24/22  1709     History  Chief Complaint  Patient presents with   Shortness of Breath    Travis Murphy is a 87 y.o. male with vascular dementia, tobacco use, COPD, severe AS s/p TAVR, HTN, who presents with SOB.   Patient lives alone. States he "feels bad," like he "has the flu or something." For one week patient has been coughing, nonproductive. Per daughters at bedside, patient has a history of vascular dementia and has been less active. He urinated on himself today and per the daughters/girlfriend has seemed weaker than normal. Had some increased confusion, forgetting what he was doing, etc. Patient denies f/c, CP, SOB, abd pain, N/V/D/C, urinary symptoms. Denies recent falls.   Patient denies SOB but is tachypneic on exam into the upper 20s satting 89% on RA. Does not wear oxygen at home.    Shortness of Breath      Home Medications Prior to Admission medications   Medication Sig Start Date End Date Taking? Authorizing Provider  albuterol (VENTOLIN HFA) 108 (90 Base) MCG/ACT inhaler Inhale 1-2 puffs into the lungs every 6 (six) hours as needed for wheezing or shortness of breath. 05/24/21   Mardella Layman, MD  aspirin EC 81 MG tablet Take 81 mg by mouth daily.    [provider]  atorvastatin (LIPITOR) 10 MG tablet Take 10 mg by mouth daily. 11/15/15   [provider]  donepezil (ARICEPT) 10 MG tablet Take 10 mg by mouth every morning. 02/11/22   [provider]  famotidine (PEPCID) 20 MG tablet One after supper Patient taking differently: Take 20 mg by mouth daily. 03/06/21   Nyoka Cowden, MD  irbesartan (AVAPRO) 150 MG tablet Take 1 tablet (150 mg total) by mouth daily. 07/31/21   Strader, Lennart Pall, PA-C  pantoprazole (PROTONIX) 40 MG tablet TAKE 1 TABLET (40 MG TOTAL) BY MOUTH DAILY. TAKE 30-60 MIN BEFORE FIRST MEAL OF THE DAY 08/30/21   Nyoka Cowden, MD  potassium chloride SA (KLOR-CON) 20 MEQ tablet Take 1 tablet (20 mEq total) by mouth 2 (two) times daily. Patient not taking: Reported on 01/03/2022 07/11/21   Benjiman Core, MD  tamsulosin (FLOMAX) 0.4 MG CAPS capsule Take 0.4 mg by mouth daily. 02/11/22   [provider]  vitamin B-12 (CYANOCOBALAMIN) 1000 MCG tablet Take 1,000 mcg by mouth daily.    [provider]      Allergies    Patient has no known allergies.    Review of Systems   Review of Systems  Respiratory:  Positive for shortness of breath.    Review of systems Negative for f/c.  A 10 point review of systems was performed and is negative unless otherwise reported in HPI.  Physical Exam Updated Vital Signs BP 129/87   Pulse 72   Temp 98.8 F (37.1 C) (Oral)   Resp (!) 22   Wt 55.3 kg   SpO2 95%   BMI 19.69 kg/m  Physical Exam General: Normal appearing elderly male, lying in bed.  HEENT: Sclera anicteric, MMM, trachea midline.  Cardiology: RRR, no murmurs/rubs/gallops. BL radial and DP pulses equal bilaterally.  Resp: Mildly increased respiratory effort with tachypnea in high 20s up to 30 breaths per minute. Rales bilaterally with no wheezes.  Abd: Soft, non-tender, non-distended. No rebound tenderness or guarding.  GU: Deferred. MSK: No peripheral edema or signs of trauma. Extremities without deformity  or TTP. No cyanosis or clubbing. Skin: warm, dry.  Neuro: A&Ox3, CNs II-XII grossly intact. MAEs. Sensation grossly intact.  Psych: Normal mood and affect.   ED Results / Procedures / Treatments   Labs (all labs ordered are listed, but only abnormal results are displayed) Labs Reviewed  RESP PANEL BY RT-PCR (RSV, FLU A&B, COVID)  RVPGX2 - Abnormal; Notable for the following components:      Result Value   Resp Syncytial Virus by PCR POSITIVE (*)    All other components within normal limits    EKG EKG Interpretation  Date/Time:  Saturday August 24 2022 17:24:34  EST Ventricular Rate:  78 PR Interval:  178 QRS Duration: 78 QT Interval:  392 QTC Calculation: 446 R Axis:   -29 Text Interpretation: Normal sinus rhythm Left axis deviation Similar to prior When compared with ECG of 11-Jul-2021 18:56, PREVIOUS ECG IS PRESENT Confirmed by Vivi Barrack [phone]) on 08/24/2022 6:41:04 PM  Radiology DG Chest 2 View  Result Date: 08/24/2022 CLINICAL DATA:  Shortness of breath. EXAM: CHEST - 2 VIEW COMPARISON:  One-view chest x-ray 07/11/2021. CTA chest 07/15/2022. Two-view chest x-ray 03/07/2021 FINDINGS: Heart size is normal. Changes of COPD are again noted. Chronic interstitial coarsening and scarring is present. The overall interstitial pattern is slightly increased, particularly in the right lower lobe. No significant airspace consolidation is present. Exaggerated thoracic kyphosis is present. IMPRESSION: 1. Chronic interstitial coarsening and scarring. 2. Slight increase in interstitial pattern, particularly in the right lower lobe. This most likely represents mild edema. Electronically Signed   By: Marin Roberts M.D.   On: 08/24/2022 18:02    Procedures Procedures    Medications Ordered in ED None  ED Course/ Medical Decision Making/ A&P                          Medical Decision Making Amount and/or Complexity of Data Reviewed Labs: ordered. Decision-making details documented in ED Course. Radiology: ordered. Decision-making details documented in ED Course.  Risk Decision regarding hospitalization.    This patient presents to the ED for concern of SOB, this involves an extensive number of treatment options, and is a complaint that carries with it a high risk of complications and morbidity.  I considered the following differential and admission for this acute, potentially life threatening condition.   MDM:    Patient accompanied by 2 daughters who provide additional history.  Daughters believe that patient is generally weak, enough so  that he did not even get up to use the restroom today.  He is tachypneic on exam though he reports no shortness of breath and he desats to 84% on room air so was placed on 2 L nasal cannula.  For patient's generalized weakness and dyspnea, consider:  Infectious processes such as pneumonia or upper respiratory infection like COVID/flu/RSV; severe metabolic derangements or electrolyte abnormalities, ischemia/ACS though he has no chest pain, anemia.  Patient has no lower extremity edema on exam to suggest a heart failure exacerbation.  He has no wheezing on exam to suggest COPD/RAD exacerbation.  He is focally neuro intact without any trauma reported I have a lower suspicion for intracranial/central processes.  Viral panel and chest x-ray ordered from triage.  EKG is reassuring without any arrhythmia or signs of ischemia.  Clinical Course as of 08/25/22 0224  Sat Aug 24, 2022  1839 Respiratory Syncytial Virus by PCR(!): POSITIVE [HN]  1839 DG Chest 2 View FINDINGS: Heart size is  normal. Changes of COPD are again noted. Chronic interstitial coarsening and scarring is present. The overall interstitial pattern is slightly increased, particularly in the right lower lobe. No significant airspace consolidation is present. Exaggerated thoracic kyphosis is present.  IMPRESSION: 1. Chronic interstitial coarsening and scarring. 2. Slight increase in interstitial pattern, particularly in the right lower lobe. This most likely represents mild edema.   [HN]  2202 Desatted to 84% on RA at rest.  [HN]  2202 Will consult to hospitalist for admission [HN]  2236 WBC: 7.1 [HN]  2236 Hemoglobin: 13.2 [HN]  2236 Basic metabolic panelBevelyn Buckles [HN]    Clinical Course User Index [HN] Loetta Rough, MD    Labs: I Ordered, and personally interpreted labs.  The pertinent results include:  those listed above  Imaging Studies ordered: I ordered imaging studies including chest x-ray I independently  visualized and interpreted imaging. I agree with the radiologist interpretation  Additional history obtained from patient and daughters at bedside as well as chart review.    Cardiac Monitoring: The patient was maintained on a cardiac monitor.  I personally viewed and interpreted the cardiac monitored which showed an underlying rhythm of: Normal sinus rhythm  Social Determinants of Health:  patient lives alone  Disposition: Admit to medicine for further workup and management of acute hypoxic respiratory failure in the setting of RSV  Co morbidities that complicate the patient evaluation  Past Medical History:  Diagnosis Date   Arthritis    Essential hypertension    Hyperlipemia    Hyperplastic colon polyp 2002   Dr. Lovell Sheehan   Pulmonary nodule    a. right lower lobe. 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 severe AS by echo in 2017 b. 01/2018: repeat echo showing EF of 55-60%, Grade 2 DD, and severe AS with mean gradient of 58 mm Hg   Vertigo      Medicines No orders of the defined types were placed in this encounter.   I have reviewed the patients home medicines and have made adjustments as needed  Problem List / ED Course: Problem List Items Addressed This Visit       Respiratory   Acute respiratory failure with hypoxia (HCC)    New O2 requirement secondary to RSV pneumonia. Superimposed on COPD.      Other Visit Diagnoses     RSV infection    -  Primary                   This note was created using dictation software, which may contain spelling or grammatical errors.    Loetta Rough, MD 08/25/22 [id number]

## 2022-08-24 NOTE — ED Notes (Signed)
Went into patient's room to get him out of the bed and walk with him to monitor oxygen saturation. Patient had been off of oxygen.  Patient's oxygen saturation is 84% on room air with good pleth.  Provider made aware and patient placed back on 2 lpm.

## 2022-08-24 NOTE — ED Triage Notes (Signed)
  BIB daughter for SOB and decreased O2 sats. Pt is not on oxygen at home. Pt sats were 87% on room air from home pulse ox. Pt has hx of dementia but daugther states pt has not been acting himself for the past couple of days. Pt denies any pain or SOB. Pt 87% on room air in Triage and placed on 3l o2.

## 2022-08-25 DIAGNOSIS — J9601 Acute respiratory failure with hypoxia: Secondary | ICD-10-CM

## 2022-08-25 DIAGNOSIS — Z952 Presence of prosthetic heart valve: Secondary | ICD-10-CM

## 2022-08-25 DIAGNOSIS — I10 Essential (primary) hypertension: Secondary | ICD-10-CM

## 2022-08-25 DIAGNOSIS — J121 Respiratory syncytial virus pneumonia: Secondary | ICD-10-CM

## 2022-08-25 LAB — BASIC METABOLIC PANEL
Anion gap: 6 (ref 5–15)
BUN: 17 mg/dL (ref 8–23)
CO2: 27 mmol/L (ref 22–32)
Calcium: 7.9 mg/dL — ABNORMAL LOW (ref 8.9–10.3)
Chloride: 104 mmol/L (ref 98–111)
Creatinine, Ser: 0.83 mg/dL (ref 0.61–1.24)
GFR, Estimated: >60 mL/min (ref >60)
Glucose, Bld: 86 mg/dL (ref 70–99)
Potassium: 3.7 mmol/L (ref 3.5–5.1)
Sodium: 137 mmol/L (ref 135–145)

## 2022-08-25 LAB — CBC
HCT: 39.7 % (ref 39.0–52.0)
Hemoglobin: 13.2 g/dL (ref 13.0–17.0)
MCH: 31.7 pg (ref 26.0–34.0)
MCHC: 33.2 g/dL (ref 30.0–36.0)
MCV: 95.4 fL (ref 80.0–100.0)
Platelets: 124 10*3/uL — ABNORMAL LOW (ref 150–400)
RBC: 4.16 MIL/uL — ABNORMAL LOW (ref 4.22–5.81)
RDW: 13.7 % (ref 11.5–15.5)
WBC: 6.9 10*3/uL (ref 4.0–10.5)
nRBC: 0 % (ref 0.0–0.2)

## 2022-08-25 LAB — BRAIN NATRIURETIC PEPTIDE: B Natriuretic Peptide: 294 pg/mL — ABNORMAL HIGH (ref 0.0–100.0)

## 2022-08-25 LAB — HIV ANTIBODY (ROUTINE TESTING W REFLEX): HIV Screen 4th Generation wRfx: NONREACTIVE

## 2022-08-25 MED ORDER — PANTOPRAZOLE SODIUM 40 MG PO TBEC
40.0000 mg | DELAYED_RELEASE_TABLET | Freq: Every day | ORAL | Status: DC
  Administered 2022-08-25 – 2022-08-27 (×3): 40 mg via ORAL
  Filled 2022-08-25 (×3): qty 1

## 2022-08-25 MED ORDER — ACETAMINOPHEN 325 MG PO TABS
650.0000 mg | ORAL_TABLET | Freq: Four times a day (QID) | ORAL | Status: DC | PRN
  Administered 2022-08-25 (×2): 650 mg via ORAL
  Filled 2022-08-25 (×2): qty 2

## 2022-08-25 MED ORDER — DM-GUAIFENESIN ER 30-600 MG PO TB12
1.0000 | ORAL_TABLET | Freq: Two times a day (BID) | ORAL | Status: DC
  Administered 2022-08-25 – 2022-08-27 (×5): 1 via ORAL
  Filled 2022-08-25 (×5): qty 1

## 2022-08-25 MED ORDER — TRAZODONE HCL 50 MG PO TABS
100.0000 mg | ORAL_TABLET | Freq: Every day | ORAL | Status: DC
  Administered 2022-08-25 – 2022-08-26 (×2): 100 mg via ORAL
  Filled 2022-08-25 (×2): qty 2

## 2022-08-25 MED ORDER — METHYLPREDNISOLONE SODIUM SUCC 40 MG IJ SOLR
40.0000 mg | Freq: Two times a day (BID) | INTRAMUSCULAR | Status: DC
  Administered 2022-08-25 – 2022-08-27 (×4): 40 mg via INTRAVENOUS
  Filled 2022-08-25 (×4): qty 1

## 2022-08-25 NOTE — TOC Transition Note (Signed)
Transition of Care [facility]) - CM/SW Discharge Note   Patient Details  Name: Travis Murphy MRN: [id number] Date of Birth: 10/07/1934  [facility]) CM/SW Contact:  Boneta Lucks, RN Phone Number: 08/25/2022, 2:51 PM   Clinical Narrative:  Patient need rolling walker, does not have one at home. Referral sent to [facility] with [employer], order placed. Daughter ask to drop ship to the home.   Final next level of care: Home w Home Health Services Barriers to Discharge: Continued Medical Work up   Patient Goals and CMS Choice CMS Medicare.gov Compare Post Acute Care list provided to:: Patient Represenative (must comment) Choice offered to / list presented to : Adult Children  Discharge Placement     Home with [facility]   Discharge Plan and Services Additional resources added to the After Visit Summary for                  DME Arranged: Walker rolling DME Agency: [employer] Date DME Agency Contacted: 08/25/22 Time DME Agency Contacted: [phone] Representative spoke with at DME Agency: [place]: PT [place]: [place] Date [place]: 08/25/22 Time [place]: 1216 Representative spoke with at [place]: [place] ([place]) Interventions [place]: No Food Insecurity (08/25/2022)  Housing: Low Risk  (08/25/2022)  Transportation Needs: No Transportation Needs (08/25/2022)  Utilities: Not At Risk (08/25/2022)  Alcohol Screen: Low Risk  (07/27/2022)  Depression (PHQ2-9): Low Risk  (07/27/2022)  Financial Resource Strain: Low Risk  (07/27/2022)  Physical Activity: Inactive (07/27/2022)  Social Connections: Moderately Isolated (07/27/2022)  Stress: No Stress Concern Present (07/27/2022)  Tobacco Use: High Risk (08/24/2022)     Readmission Risk Interventions     No data to display

## 2022-08-25 NOTE — TOC Initial Note (Signed)
Transition of Care [facility]) - Initial/Assessment Note    Patient Details  Name: Travis Murphy MRN: [id number] Date of Birth: 06/02/1935  [facility]) CM/SW Contact:    Boneta Lucks, RN Phone Number: 08/25/2022, 12:17 PM  Clinical Narrative:    Patient admitted in OBS for RSV. PT recommending HHPT. CM spoke with daughter, Katharine Look, she states he is agreeable. No preferences. Georgina Snell with [facility] accepted the referral. MD aware to order.    Katharine Look will check to see if patient needs a rolling walker, They will check the old one that have at home. TOC to follow.               Expected Discharge Plan: [place] Barriers to Discharge: Continued Medical Work up   Patient Goals and CMS Choice Patient states their goals for this hospitalization and ongoing recovery are:: to go home. CMS Medicare.gov Compare Post Acute Care list provided to:: Patient Represenative (must comment) Choice offered to / list presented to : Adult Children      Expected Discharge Plan and Services        HH Arranged: PT [place] Agency: [place] Date [facility] Agency Contacted: 08/25/22 Time HH Agency Contacted: 1216 Representative spoke with at [place]: [place] Arrangements/Services     Patient language and need for interpreter reviewed:: Yes        Need for Family Participation in Patient Care: Yes (Comment) Care giver support system in place?: Yes (comment)   Criminal Activity/Legal Involvement Pertinent to Current Situation/Hospitalization: No - Comment as needed  Activities of Daily Living Home Assistive Devices/Equipment: Cane (specify quad or straight), Walker (specify type) ADL Screening (condition at time of admission) Patient's cognitive ability adequate to safely complete daily activities?: Yes Is the patient deaf or have difficulty hearing?: No Does the patient have difficulty seeing, even when wearing glasses/contacts?: No Does the  patient have difficulty concentrating, remembering, or making decisions?: No Patient able to express need for assistance with ADLs?: Yes Does the patient have difficulty dressing or bathing?: No Independently performs ADLs?: No Communication: Independent Dressing (OT): Needs assistance Is this a change from baseline?: Pre-admission baseline Grooming: Needs assistance Is this a change from baseline?: Pre-admission baseline Feeding: Needs assistance Is this a change from baseline?: Pre-admission baseline Bathing: Needs assistance Is this a change from baseline?: Pre-admission baseline Toileting: Needs assistance Is this a change from baseline?: Pre-admission baseline In/Out Bed: Needs assistance Is this a change from baseline?: Pre-admission baseline Walks in Home: Needs assistance Is this a change from baseline?: Pre-admission baseline Does the patient have difficulty walking or climbing stairs?: Yes Weakness of Legs: Both Weakness of Arms/Hands: None  Permission Sought/Granted            Permission granted to share info w Relationship: Daughter  Permission granted to share info w Contact Information: Katharine Look  Emotional Assessment         Alcohol / Substance Use: Not Applicable Psych Involvement: No (comment)  Admission diagnosis:  RSV (respiratory syncytial virus pneumonia) [J12.1] Acute respiratory failure with hypoxia ([place]) [J96.01] RSV infection [B33.8] Patient Active Problem List   Diagnosis Date Noted   Acute respiratory failure with hypoxia ([place]) 08/25/2022   RSV (respiratory syncytial virus pneumonia) 08/24/2022   COPD GOLD 2 / still smoking 03/06/2021   Upper airway cough syndrome 03/06/2021   Acute hypoxemic respiratory failure due to COVID-19 [facility]) 02/05/2021   Pulmonary nodule    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   Hyperlipemia    Cigarette smoker    Incidental pulmonary nodule, greater than or equal to 15m 04/06/2018   Severe  aortic valve stenosis    Essential hypertension    History of colon polyps 07/15/2011   PCP:  FAsencion Noble MD Pharmacy:   [id number] [place]: [phone]: [phone]   Social Determinants of Health (SDOH) Social History: SDFerrumNo Food Insecurity (08/25/2022)  Housing: Low Risk  (08/25/2022)  Transportation Needs: No Transportation Needs (08/25/2022)  Utilities: Not At Risk (08/25/2022)  Alcohol Screen: Low Risk  (07/27/2022)  Depression (PHQ2-9): Low Risk  (07/27/2022)  Financial Resource Strain: Low Risk  (07/27/2022)  Physical Activity: Inactive (07/27/2022)  Social Connections: Moderately Isolated (07/27/2022)  Stress: No Stress Concern Present (07/27/2022)  Tobacco Use: High Risk (08/24/2022)   SDOH Interventions:

## 2022-08-25 NOTE — H&P (Signed)
History and Physical    Patient: Travis Murphy FTD:[id number] DOB: 1935-08-19 DOA: 08/24/2022 DOS: the patient was seen and examined on 08/25/2022 PCP: Asencion Noble, MD  Patient coming from: Home  Chief Complaint:  Chief Complaint  Patient presents with   Shortness of Breath   HPI: Travis Murphy is a 87 y.o. male with medical history significant of COPD, severe AS s/p TAVR, HTN.  Pt presents to ED with c/o SOB and decreased O2 sats.  Pt not on O2 at baseline.  Pt with h/o dementia.  Feels bad "like he has the flu or something" for past 1 week.  Non-productive cough.  Per daughters at bedside, patient has a history of vascular dementia and has been less active. He urinated on himself today and per the daughters/girlfriend has seemed weaker than normal. Had some increased confusion, forgetting what he was doing, etc. Patient denies f/c, CP, SOB, abd pain, N/V/D/C, urinary symptoms. Denies recent falls.  Patient denies SOB but is tachypneic on exam into the upper 20s satting 89% on RA. Does not wear oxygen at home.       Review of Systems: As mentioned in the history of present illness. All other systems reviewed and are negative. Past Medical History:  Diagnosis Date   Arthritis    Essential hypertension    Hyperlipemia    Hyperplastic colon polyp 2002   Dr. Arnoldo Morale   Pulmonary nodule    a. right lower lobe. 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 severe AS by echo in 2017 b. 01/2018: repeat echo showing EF of 55-60%, Grade 2 DD, and severe AS with mean gradient of 58 mm Hg   Vertigo    Past Surgical History:  Procedure Laterality Date   CATARACT EXTRACTION Right    COLONOSCOPY  08/17/2001   Small polyp measured 3 mm removed/multiple middle scattered  diverticula in the ascending colon-hyperplastic   COLONOSCOPY  08/14/2011   Procedure: COLONOSCOPY;  Surgeon: Daneil Dolin, MD;  Location: [facility];  Service: Endoscopy;  Laterality: N/A;  10:00   EXCISIONAL HEMORRHOIDECTOMY     INTRAOPERATIVE TRANSTHORACIC ECHOCARDIOGRAM N/A 04/21/2018   Procedure: INTRAOPERATIVE TRANSTHORACIC ECHOCARDIOGRAM;  Surgeon: Burnell Blanks, MD;  Location: [facility];  Service: Open Heart Surgery;  Laterality: N/A;   RIGHT/LEFT HEART CATH AND CORONARY ANGIOGRAPHY N/A 03/25/2018   Procedure: RIGHT/LEFT HEART CATH AND CORONARY ANGIOGRAPHY;  Surgeon: Burnell Blanks, MD;  Location: [facility];  Service: Cardiovascular;  Laterality: N/A;   TRANSCATHETER AORTIC VALVE REPLACEMENT, TRANSFEMORAL  04/21/2018   TRANSCATHETER AORTIC VALVE REPLACEMENT, TRANSFEMORAL N/A 04/21/2018   Procedure: TRANSCATHETER AORTIC VALVE REPLACEMENT, TRANSFEMORAL;  Surgeon: Burnell Blanks, MD;  Location: [facility];  Service: Open Heart Surgery;  Laterality: N/A;   Social History:  reports that he has been smoking cigarettes. He has a 65.00 pack-year smoking history. He has been exposed to tobacco smoke. He has never used smokeless tobacco. He reports that he does not currently use alcohol. He reports that he does not use drugs.  No Known Allergies  Family History  Problem Relation Age of Onset   Stroke Mother    Heart attack Father    Liver disease Sister    Renal Disease Brother     Prior  to Admission medications   Medication Sig Start Date End Date Taking? Authorizing Provider  albuterol (VENTOLIN HFA) 108 (90 Base) MCG/ACT inhaler Inhale 1-2 puffs into the lungs every 6 (six) hours as needed for wheezing or shortness of breath. 05/24/21   Vanessa Kick, MD  aspirin EC 81 MG tablet Take 81 mg by mouth daily.    [provider]  atorvastatin (LIPITOR) 10 MG tablet Take 10 mg by mouth daily. 11/15/15   [provider]  donepezil (ARICEPT) 10 MG  tablet Take 10 mg by mouth every morning. 02/11/22   [provider]  famotidine (PEPCID) 20 MG tablet One after supper Patient taking differently: Take 20 mg by mouth daily. 03/06/21   Tanda Rockers, MD  irbesartan (AVAPRO) 150 MG tablet Take 1 tablet (150 mg total) by mouth daily. 07/31/21   Strader, Fransisco Hertz, PA-C  pantoprazole (PROTONIX) 40 MG tablet TAKE 1 TABLET (40 MG TOTAL) BY MOUTH DAILY. TAKE 30-60 MIN BEFORE FIRST MEAL OF THE DAY 08/30/21   Tanda Rockers, MD  potassium chloride SA (KLOR-CON) 20 MEQ tablet Take 1 tablet (20 mEq total) by mouth 2 (two) times daily. Patient not taking: Reported on 01/03/2022 07/11/21   Davonna Belling, MD  tamsulosin (FLOMAX) 0.4 MG CAPS capsule Take 0.4 mg by mouth daily. 02/11/22   [provider]  vitamin B-12 (CYANOCOBALAMIN) 1000 MCG tablet Take 1,000 mcg by mouth daily.    [provider]    Physical Exam: Vitals:   08/24/22 2240 08/24/22 2300 08/24/22 2330 08/25/22 0000  BP:  (!) 144/82 130/82 104/61  Pulse:  88 72 74  Resp:  (!) 22 (!) 24 (!) 23  Temp:      TempSrc:      SpO2: 95% 95% 92% 90%  Weight:       Constitutional: NAD, calm, comfortable Eyes: PERRL, lids and conjunctivae normal ENMT: Mucous membranes are moist. Posterior pharynx clear of any exudate or lesions.Normal dentition.  Neck: normal, supple, no masses, no thyromegaly Respiratory: No wheezes, Initially worried that him moving shoulder represented accessory muscle use, but daughter at bedside reassures me that he always does that chronically. Cardiovascular: Regular rate and rhythm, no murmurs / rubs / gallops. No extremity edema. 2+ pedal pulses. No carotid bruits.  Abdomen: no tenderness, no masses palpated. No hepatosplenomegaly. Bowel sounds positive.  Musculoskeletal: no clubbing / cyanosis. No joint deformity upper and lower extremities. Good ROM, no contractures. Normal muscle tone.  Skin: no rashes, lesions, ulcers. No  induration Neurologic: CN 2-12 grossly intact. Sensation intact, DTR normal. Strength 5/5 in all 4.  Psychiatric: Pleasantly confused, denies any complaints currently.  Data Reviewed:    RSV positive Covid and Flu neg  CXR = IMPRESSION: 1. Chronic interstitial coarsening and scarring. 2. Slight increase in interstitial pattern, particularly in the right lower lobe. This most likely represents mild edema.     Latest Ref Rng & Units 08/24/2022    9:36 PM 07/16/2021    2:19 PM 07/11/2021    7:10 PM  CBC  WBC 4.0 - 10.5 K/uL 7.1  8.1  11.8   Hemoglobin 13.0 - 17.0 g/dL 13.2  13.9  13.7   Hematocrit 39.0 - 52.0 % 39.4  41.5  40.2   Platelets 150 - 400 K/uL 123  170  135       Latest Ref Rng & Units 08/24/2022    9:36 PM 07/16/2021    2:19 PM 07/11/2021    7:10 PM  CMP  Glucose 70 - 99 mg/dL 83  95  179   BUN 8 - 23 mg/dL '17  12  19   '$ Creatinine 0.61 - 1.24 mg/dL 0.93  0.84  1.11   Sodium 135 - 145 mmol/L 135  140  140   Potassium 3.5 - 5.1 mmol/L 3.5  3.7  3.0   Chloride 98 - 111 mmol/L 103  107  106   CO2 22 - 32 mmol/L '27  29  25   '$ Calcium 8.9 - 10.3 mg/dL 7.8  8.5  8.1   Total Protein 6.5 - 8.1 g/dL   5.9   Total Bilirubin 0.3 - 1.2 mg/dL   1.7   Alkaline Phos 38 - 126 U/L   98   AST 15 - 41 U/L   90   ALT 0 - 44 U/L   50       Assessment and Plan: * RSV (respiratory syncytial virus pneumonia) Neg procalcitonin - doubt superimposed bacterial infection. Not really wheezing. COPD pathway for neb treatments and inhalers PRN SABA Scheduled LABA,LAMA, and INH steroid. But with no wheezing, not clear that steroids will help much so ill hold off on ordering systemic steroids for the moment. Will defer decision wether starting antiviral treatment to day team (havent done this in an adult for RSV before). Satting low 90s at the moment on 2L via [place] May just want to let RSV run its course. Check BNP, but suspect the CXR findings are more likely RSV than new onset CHF  given the ILI symptoms for past week.  RSV positive on PCR today. Does have grade 2 DD on echo 11 days ago though, so is possible.  Acute respiratory failure with hypoxia (HCC) New O2 requirement secondary to RSV pneumonia. Superimposed on COPD.  S/P TAVR (transcatheter aortic valve replacement) Valve looked okay on echo just 11 days ago. Grade 2 DD, normal EF, mild MR.      Advance Care Planning:   Code Status: DNR Confirmed with daughter at bedside: DNR/DNI  Consults: None  Family Communication: Daughter at bedside  Severity of Illness: The appropriate patient status for this patient is OBSERVATION. Observation status is judged to be reasonable and necessary in order to provide the required intensity of service to ensure the patient's safety. The patient's presenting symptoms, physical exam findings, and initial radiographic and laboratory data in the context of their medical condition is felt to place them at decreased risk for further clinical deterioration. Furthermore, it is anticipated that the patient will be medically stable for discharge from the hospital within 2 midnights of admission.   Author: Etta Quill., DO 08/25/2022 12:40 AM  For on call review [email or web address].

## 2022-08-25 NOTE — Care Management Obs Status (Signed)
[place] NOTIFICATION   Patient Details  Name: Travis Murphy MRN: [id number] Date of Birth: [birth year]   Medicare Observation Status Notification Given:  Yes    Boneta Lucks, RN 08/25/2022, 12:22 PM

## 2022-08-25 NOTE — Assessment & Plan Note (Addendum)
New O2 requirement secondary to RSV pneumonia. Superimposed on COPD.

## 2022-08-25 NOTE — Assessment & Plan Note (Addendum)
Neg procalcitonin - doubt superimposed bacterial infection. Not really wheezing. COPD pathway for neb treatments and inhalers PRN SABA Scheduled LABA,LAMA, and INH steroid. But with no wheezing, not clear that steroids will help much so ill hold off on ordering systemic steroids for the moment. Will defer decision wether starting antiviral treatment to day team (havent done this in an adult for RSV before). Satting low 90s at the moment on 2L via [place] May just want to let RSV run its course. Check BNP, but suspect the CXR findings are more likely RSV than new onset CHF given the ILI symptoms for past week.  RSV positive on PCR today. Does have grade 2 DD on echo 11 days ago though, so is possible.

## 2022-08-25 NOTE — Evaluation (Signed)
PPhysical Therapy Evaluation Patient Details Name: Travis Murphy MRN: [id number] DOB: [birth year] Today's Date: 08/25/2022  History of Present Illness  PT to ER witn SOB, DX witn RSV.  Clinical Impression  Pt is slightly unsteady on his feet, however, per daughters this is his prior level of function.  Pt functions better with a rolling walker vs.cane, therefore, therapist recommends pt to be D/C with RW.  Pt daughter states that they can supply 24 hr supervision for the first couple of days while their father regains strength.  Pt states he feels good and wants to go home.        Recommendations for follow up therapy are one component of a multi-disciplinary discharge planning process, led by the attending physician.  Recommendations may be updated based on patient status, additional functional criteria and insurance authorization.  Follow Up Recommendations Home health PT      Assistance Recommended at Discharge Frequent or constant Supervision/Assistance (for 1-2 days discussed with daughters who have no problem with this.)  Patient can return home with the following  A little help with walking and/or transfers;A little help with bathing/dressing/bathroom;Assistance with cooking/housework (Pt states he does not cook he eats out)    Nurse, mental health (2 wheels)  Recommendations for Other Services       Functional Status Assessment Patient has had a recent decline in their functional status and demonstrates the ability to make significant improvements in function in a reasonable and predictable amount of time.     Precautions / Restrictions Precautions Precautions: None;Fall Restrictions Weight Bearing Restrictions: No      Mobility  Bed Mobility Overal bed mobility: Modified Independent                  Transfers Overall transfer level: Modified independent Equipment used: None                     Ambulation/Gait Ambulation/Gait assistance: Supervision Gait Distance (Feet): 15 Feet Assistive device: Rolling walker (2 wheels) Gait Pattern/deviations: Decreased step length - right, Decreased step length - left Gait velocity: slower due to O2, IV              Pertinent Vitals/Pain Pain Assessment Pain Assessment: No/denies pain    Home Living Family/patient expects to be discharged to:: Private residence Living Arrangements: Alone Available Help at Discharge: Family;Available 24 hours/day Type of Home: House Home Access: Stairs to enter Entrance Stairs-Rails: Right Entrance Stairs-Number of Steps: 2   Home Layout: One level Home Equipment: Cane - single point      Prior Function Prior Level of Function : Independent/Modified Independent                        Extremity/Trunk Assessment        Lower Extremity Assessment Lower Extremity Assessment: Overall WFL for tasks assessed       Communication      Cognition Arousal/Alertness: Awake/alert   Overall Cognitive Status: Within Functional Limits for tasks assessed                                          General Comments  Discussed benefit of diaphragm breathing vs. Acessory breathing     Exercises General Exercises - Lower Extremity Hip Flexion/Marching: Both, 10 reps Heel Raises: Both, 5 reps Mini-Sqauts:  (side stepping x 2  RT)   Assessment/Plan    PT Assessment Patient needs continued PT services  PT Problem List Decreased strength;Decreased activity tolerance       PT Treatment Interventions Therapeutic activities;Therapeutic exercise;Balance training    PT Goals (Current goals can be found in the Care Plan section)       Frequency Min 2X/week        AM-PAC PT "6 Clicks" Mobility  Outcome Measure Help needed turning from your back to your side while in a flat bed without using bedrails?: None Help needed moving from lying on your back to sitting on  the side of a flat bed without using bedrails?: None Help needed moving to and from a bed to a chair (including a wheelchair)?: None Help needed standing up from a chair using your arms (e.g., wheelchair or bedside chair)?: None Help needed to walk in hospital room?: A Little Help needed climbing 3-5 steps with a railing? : A Lot 6 Click Score: 21    End of Session Equipment Utilized During Treatment: Gait belt Activity Tolerance: Patient tolerated treatment well Patient left: with family/visitor present (sittng at bedside)   PT Visit Diagnosis: Unsteadiness on feet (R26.81)    Time: 8469-6295 PT Time Calculation (min) (ACUTE ONLY): 28 min   Charges:   PT Evaluation $PT Eval Low Complexity: [place], PT CLT [phone]  08/25/2022, 11:28 AM

## 2022-08-25 NOTE — Progress Notes (Signed)
Patient seen and evaluated, chart reviewed, please see EMR for updated orders. Please see full H&P dictated by admitting physician Dr Alcario Drought for same date of service.   Brief Summary:-  87 y.o. male with medical history significant of COPD, severe AS s/p TAVR, HTN and ongoing tobacco abuse admitted on 01/23/2022 with acute hypoxic respiratory failure in the setting of RSV infection and underlying COPD  A/p 1) RSV bronchitis----with significant respiratory symptoms and hypoxia -Bronchodilators as ordered -Supportive and symptomatic treatment  2) acute COPD exacerbation--- secondary to #1 above -IV Solu-Medrol mucolytics and bronchodilators as ordered  3)acute hypoxic respiratory failure--- -Secondary to #1 #2 above -attempted to wean patient off oxygen O2 sats dropped to 85 to 86% on room air -Placed back on 2 L of oxygen via nasal cannula O2 saturation of 92 to 93% on room air  4) generalized weakness and deconditioning--- therapy eval appreciated recommends home health physical therapy  5)GERD--Protonix especially while on steroids  -Total care time 53 minutes - Plan of care discussed with patient's 2 daughters at bedside, questions answered  Roxan Hockey, MD

## 2022-08-25 NOTE — Assessment & Plan Note (Signed)
Valve looked okay on echo just 11 days ago. Grade 2 DD, normal EF, mild MR.

## 2022-08-26 DIAGNOSIS — J205 Acute bronchitis due to respiratory syncytial virus: Secondary | ICD-10-CM | POA: Diagnosis present

## 2022-08-26 DIAGNOSIS — I5032 Chronic diastolic (congestive) heart failure: Secondary | ICD-10-CM | POA: Diagnosis present

## 2022-08-26 DIAGNOSIS — E785 Hyperlipidemia, unspecified: Secondary | ICD-10-CM | POA: Diagnosis present

## 2022-08-26 DIAGNOSIS — Z66 Do not resuscitate: Secondary | ICD-10-CM | POA: Diagnosis present

## 2022-08-26 DIAGNOSIS — F0150 Vascular dementia without behavioral disturbance: Secondary | ICD-10-CM | POA: Diagnosis present

## 2022-08-26 DIAGNOSIS — M1990 Unspecified osteoarthritis, unspecified site: Secondary | ICD-10-CM | POA: Diagnosis present

## 2022-08-26 DIAGNOSIS — Z953 Presence of xenogenic heart valve: Secondary | ICD-10-CM | POA: Diagnosis not present

## 2022-08-26 DIAGNOSIS — J441 Chronic obstructive pulmonary disease with (acute) exacerbation: Secondary | ICD-10-CM | POA: Diagnosis present

## 2022-08-26 DIAGNOSIS — Z823 Family history of stroke: Secondary | ICD-10-CM | POA: Diagnosis not present

## 2022-08-26 DIAGNOSIS — Z7982 Long term (current) use of aspirin: Secondary | ICD-10-CM | POA: Diagnosis not present

## 2022-08-26 DIAGNOSIS — R0602 Shortness of breath: Secondary | ICD-10-CM | POA: Diagnosis present

## 2022-08-26 DIAGNOSIS — B338 Other specified viral diseases: Secondary | ICD-10-CM | POA: Diagnosis present

## 2022-08-26 DIAGNOSIS — Z8249 Family history of ischemic heart disease and other diseases of the circulatory system: Secondary | ICD-10-CM | POA: Diagnosis not present

## 2022-08-26 DIAGNOSIS — Z841 Family history of disorders of kidney and ureter: Secondary | ICD-10-CM | POA: Diagnosis not present

## 2022-08-26 DIAGNOSIS — Z79899 Other long term (current) drug therapy: Secondary | ICD-10-CM | POA: Diagnosis not present

## 2022-08-26 DIAGNOSIS — J121 Respiratory syncytial virus pneumonia: Secondary | ICD-10-CM | POA: Diagnosis not present

## 2022-08-26 DIAGNOSIS — J9601 Acute respiratory failure with hypoxia: Secondary | ICD-10-CM | POA: Diagnosis not present

## 2022-08-26 DIAGNOSIS — D696 Thrombocytopenia, unspecified: Secondary | ICD-10-CM | POA: Diagnosis present

## 2022-08-26 DIAGNOSIS — Z1152 Encounter for screening for COVID-19: Secondary | ICD-10-CM | POA: Diagnosis not present

## 2022-08-26 DIAGNOSIS — I110 Hypertensive heart disease with heart failure: Secondary | ICD-10-CM | POA: Diagnosis present

## 2022-08-26 DIAGNOSIS — F17210 Nicotine dependence, cigarettes, uncomplicated: Secondary | ICD-10-CM | POA: Diagnosis present

## 2022-08-26 DIAGNOSIS — K219 Gastro-esophageal reflux disease without esophagitis: Secondary | ICD-10-CM | POA: Diagnosis present

## 2022-08-26 DIAGNOSIS — I4891 Unspecified atrial fibrillation: Secondary | ICD-10-CM | POA: Diagnosis not present

## 2022-08-26 DIAGNOSIS — J440 Chronic obstructive pulmonary disease with acute lower respiratory infection: Secondary | ICD-10-CM | POA: Diagnosis present

## 2022-08-26 MED ORDER — MIDODRINE HCL 5 MG PO TABS
10.0000 mg | ORAL_TABLET | Freq: Three times a day (TID) | ORAL | Status: DC
  Administered 2022-08-26 – 2022-08-27 (×5): 10 mg via ORAL
  Filled 2022-08-26 (×5): qty 2

## 2022-08-26 MED ORDER — DILTIAZEM HCL 30 MG PO TABS
30.0000 mg | ORAL_TABLET | Freq: Three times a day (TID) | ORAL | Status: DC
  Administered 2022-08-27: 30 mg via ORAL
  Filled 2022-08-26 (×3): qty 1

## 2022-08-26 NOTE — Progress Notes (Signed)
Physical Therapy Treatment Patient Details Name: Travis Murphy MRN: [id number] DOB: 1935-08-07 Today's Date: 08/26/2022   History of Present Illness PT to ER witn SOB, DX witn RSV.    PT Comments    PT has no complaint, however, HR is irregular during treatment therefore therapist deferred ambulation.   Recommendations for follow up therapy are one component of a multi-disciplinary discharge planning process, led by the attending physician.  Recommendations may be updated based on patient status, additional functional criteria and insurance authorization.  Follow Up Recommendations  Home health PT     Assistance Recommended at Discharge    Patient can return home with the following A little help with walking and/or transfers;A little help with bathing/dressing/bathroom;Assistance with cooking/housework   Equipment Recommendations  Rolling walker (2 wheels)    Recommendations for Other Services       Precautions / Restrictions Precautions Precautions: None Restrictions Weight Bearing Restrictions: No     Mobility  Bed Mobility Overal bed mobility: Modified Independent                  Transfers Overall transfer level: Modified independent Equipment used: Rollator (4 wheels)               General transfer comment: sit to stand x 3; standing between 30-60 seconds each when PT HR goes above 105    Ambulation/Gait               General Gait Details: due to HR did not ambulate            Cognition Arousal/Alertness: Awake/alert Behavior During Therapy: WFL for tasks assessed/performed Overall Cognitive Status: Within Functional Limits for tasks assessed                                             General Comments General comments (skin integrity, edema, etc.): VSS on 2L      Pertinent Vitals/Pain Pain Assessment Pain Assessment: No/denies pain    Home Living Family/patient expects to be discharged to:: Private  residence Living Arrangements: Alone Available Help at Discharge: Family;Available 24 hours/day Type of Home: House Home Access: Stairs to enter Entrance Stairs-Rails: Right Entrance Stairs-Number of Steps: 2   Home Layout: One level Home Equipment: Cane - single point;Shower seat      Prior Function   PT household ambulation with cane         PT Goals (current goals can now be found in the care plan section) Progress towards PT goals: Progressing toward goals    Frequency    Min 2X/week      PT Plan Current plan remains appropriate    Co-evaluation                 End of Session Equipment Utilized During Treatment: Gait belt Activity Tolerance: Patient tolerated treatment well Patient left: with family/visitor present (sitting at edge of bed) Nurse Communication: Mobility status PT Visit Diagnosis: Unsteadiness on feet (R26.81)     Time: 1400-1415 PT Time Calculation (min) (ACUTE ONLY): 15 min  Charges:  $Therapeutic Activity: 8-22 mins                     Rayetta Humphrey, PT CLT [phone]  08/26/2022, 2:51 PM

## 2022-08-26 NOTE — Progress Notes (Signed)
PT Cancellation Note  Patient Details Name: Travis Murphy MRN: [id number] DOB: 03-14-1935   Cancelled Treatment:      Therapist went to see pt, nurse requested to hold at this time due to A-fib. Resting HR 117.  Rayetta Humphrey, PT CLT [phone] 08/26/2022, 9:28 AM

## 2022-08-26 NOTE — Evaluation (Signed)
Occupational Therapy Evaluation Patient Details Name: Abdinasir Spadafore MRN: [id number] DOB: 1935/01/30 Today's Date: 08/26/2022   History of Present Illness PT to ER witn SOB, DX witn RSV.   Clinical Impression   Pt admitted for concerns listed above. PTA pt reports that he is independent with all ADL's and IADL's, including driving. At this time, pt presenting with increased weakness. Assessment was limited due to pt's soft BP, along with active A-fib with HR up to 120's. Pt remained bed level this session. Recommending HHOT at this time, as long as pt continues to be able to get up and ambulate with min guard assist and 24/7 assist at home initially. OT will continue to follow acutely.       Recommendations for follow up therapy are one component of a multi-disciplinary discharge planning process, led by the attending physician.  Recommendations may be updated based on patient status, additional functional criteria and insurance authorization.   Follow Up Recommendations  Home health OT     Assistance Recommended at Discharge Frequent or constant Supervision/Assistance  Patient can return home with the following A little help with walking and/or transfers;A little help with bathing/dressing/bathroom;Assistance with cooking/housework;Direct supervision/assist for medications management;Help with stairs or ramp for entrance    Functional Status Assessment  Patient has had a recent decline in their functional status and demonstrates the ability to make significant improvements in function in a reasonable and predictable amount of time.  Equipment Recommendations  None recommended by OT    Recommendations for Other Services       Precautions / Restrictions Precautions Precautions: None;Fall Restrictions Weight Bearing Restrictions: No      Mobility Bed Mobility Overal bed mobility: Modified Independent             General bed mobility comments: Pt only able to roll and  long sit in bed at this time due to medical issues.    Transfers                   General transfer comment: Deferred due to BP and A-fib      Balance                                           ADL either performed or assessed with clinical judgement   ADL Overall ADL's : Needs assistance/impaired Eating/Feeding: Set up;Sitting   Grooming: Set up;Sitting   Upper Body Bathing: Set up;Sitting   Lower Body Bathing: Minimal assistance;Sitting/lateral leans;Sit to/from stand   Upper Body Dressing : Set up;Sitting   Lower Body Dressing: Minimal assistance;Sit to/from stand;Sitting/lateral leans   Toilet Transfer: Moderate assistance;Stand-pivot   Toileting- Clothing Manipulation and Hygiene: Minimal assistance;Sitting/lateral lean;Sit to/from stand         General ADL Comments: Unable to get full assessment due to medical issues     Vision Baseline Vision/History: 1 Wears glasses Ability to See in Adequate Light: 0 Adequate Patient Visual Report: No change from baseline Vision Assessment?: No apparent visual deficits     Perception Perception Perception Tested?: No   Praxis Praxis Praxis tested?: Not tested    Pertinent Vitals/Pain Pain Assessment Pain Assessment: No/denies pain     Hand Dominance Right   Extremity/Trunk Assessment Upper Extremity Assessment Upper Extremity Assessment: Generalized weakness   Lower Extremity Assessment Lower Extremity Assessment: Defer to PT evaluation   Cervical / Trunk Assessment Cervical /  Trunk Assessment: Normal   Communication Communication Communication: No difficulties   Cognition Arousal/Alertness: Lethargic Behavior During Therapy: WFL for tasks assessed/performed Overall Cognitive Status: Within Functional Limits for tasks assessed                                       General Comments  VSS on 2L    Exercises     Shoulder Instructions      Home Living  Family/patient expects to be discharged to:: Private residence Living Arrangements: Alone Available Help at Discharge: Family;Available 24 hours/day Type of Home: House Home Access: Stairs to enter [employer] of Steps: 2 Entrance Stairs-Rails: Right Home Layout: One level     Bathroom Shower/Tub: Teacher, early years/pre: Standard Bathroom Accessibility: Yes How Accessible: Accessible via walker Home Equipment: [place] - single point;Shower seat          Prior Functioning/Environment Prior Level of Function : Independent/Modified Independent             Mobility Comments: uses no AD ADLs Comments: Reports indep        OT Problem List: Decreased strength;Decreased activity tolerance;Impaired balance (sitting and/or standing);Cardiopulmonary status limiting activity;Decreased safety awareness      OT Treatment/Interventions: Self-care/ADL training;Energy conservation;DME and/or AE instruction;Therapeutic exercise;Therapeutic activities;Patient/family education;Balance training    OT Goals(Current goals can be found in the care plan section) Acute Rehab OT Goals Patient Stated Goal: To go home OT Goal Formulation: With patient/family Time For Goal Achievement: 09/09/22 Potential to Achieve Goals: Good ADL Goals Pt Will Perform Grooming: with modified independence;standing Pt Will Perform Lower Body Bathing: with modified independence;sitting/lateral leans;sit to/from stand Pt Will Perform Lower Body Dressing: with modified independence;sitting/lateral leans;sit to/from stand Pt Will Transfer to Toilet: with modified independence;stand pivot transfer Pt Will Perform Toileting - Clothing Manipulation and hygiene: with modified independence;sitting/lateral leans;sit to/from stand  OT Frequency: Min 2X/week    Co-evaluation              AM-PAC OT "6 Clicks" Daily Activity     Outcome Measure Help from another person eating meals?: A Little Help  from another person taking care of personal grooming?: A Little Help from another person toileting, which includes using toliet, bedpan, or urinal?: A Lot Help from another person bathing (including washing, rinsing, drying)?: A Lot Help from another person to put on and taking off regular upper body clothing?: A Little Help from another person to put on and taking off regular lower body clothing?: A Little 6 Click Score: 16   End of Session Equipment Utilized During Treatment: Oxygen Nurse Communication: Mobility status  Activity Tolerance: Treatment limited secondary to medical complications (Comment) (Pt having active A-Fib up to 120's and soft BP 80's/40's) Patient left: in bed;with call bell/phone within reach;with bed alarm set;with family/visitor present  OT Visit Diagnosis: Unsteadiness on feet (R26.81);Other abnormalities of gait and mobility (R26.89);Muscle weakness (generalized) (M62.81)                Time: 9509-3267 OT Time Calculation (min): 27 min Charges:  OT General Charges $OT Visit: 1 Visit OT Evaluation $OT Eval Moderate Complexity: 1 Mod OT Treatments $Self Care/Home Management : 8-22 mins  Paulita Fujita, OTR/L [place] 08/26/2022, 1:47 PM

## 2022-08-26 NOTE — Progress Notes (Signed)
Patient ambulated to the [facility] with 1 assist. While patient was sitting on the [facility] he went into Afib per the telemetry monitor. Vitals checked. I asked patient if he felt okay and he said yes. I helped patient get back into bed. Patient dropped on 2L to 87%. Bumped patient up to 3L, o2 at 88%. Patient then bumped up to 3.5L, o2 at 91%. Encouraged patient to take some deep breaths. Patient not showing signs of SOB, lungs sounds diminished. After several minutes went by, I was able to drop patient back down to 2L where he now has an o2 of 92% at this time. Patient resting comfortably in bed. Call light within reach. Daughter at bedside.

## 2022-08-26 NOTE — Progress Notes (Signed)
PROGRESS NOTE   Travis Murphy, is a 88 y.o. male, DOB - 12/17/1934, CZY:[id number]  Admit date - 08/24/2022   Admitting Physician Keymani Glynn Denton Brick, MD  Outpatient Primary MD for the patient is Asencion Noble, MD  LOS - 0  Chief Complaint  Patient presents with   Shortness of Breath      Brief Summary:-  88 y.o. male with medical history significant of COPD, severe AS s/p TAVR, HTN and ongoing tobacco abuse admitted on 08/25/2022 with acute hypoxic respiratory failure in the setting of RSV infection and underlying COPD   A/p 1) RSV bronchitis----with significant respiratory symptoms and hypoxia -Bronchodilators and anti-tussives as ordered -Supportive and symptomatic treatment   2) acute COPD exacerbation--- secondary to #1 above -IV Solu-Medrol , mucolytics and bronchodilators as ordered   3)acute hypoxic respiratory failure--- -Secondary to #1 #2 above -attempted to wean patient off oxygen O2 sats dropped to 85 to 86% on room air -Oxygen dropped to 87% on 2 L with ambulation -Placed back on 2 L of oxygen via nasal cannula O2 saturation of 92 to 93%    4)New Onset Afib--on 08/26/21 went into Afib with RVR in the setting of acute illness/acute hypoxic respiratory failure due to RSV and COPD - CHA2DS2- VASc score   is = 4 (HTN, age x 2, CHF) Which is  equal to =4.8  % annual risk of stroke   This patients CHA2DS2-VASc Score and unadjusted Ischemic Stroke Rate (% per year) is equal to 4.8 % stroke rate/year from a score of 4 HASBLED score with 3.4% risk of bleeding -Discussed with patient and daughter at bedside hold off on anticoagulation at this time -Okay to use Cardizem for rate control with BP parameters, May use midodrine for soft BP   5)Generalized weakness and deconditioning--- therapy eval appreciated recommends home health physical therapy   6)GERD--Protonix especially while on steroids  7)HFpEF--echo from 08/14/2021 with EF of 55 to 60% and grade 2 diastolic  dysfunction -Chronic diastolic dysfunction CHF without obvious acute exacerbation at this time -Appears euvolemic  8)History of severe aortic stenosis--- status post prior TAVR -Echo from 08/14/2022 shows No aortic stenosis is present. There is a 26 mm Sapien prosthetic (TAVR) valve present in the aortic position. Aortic regurgitation PHT measures 500 msec. Aortic valve mean gradient measures 9.5 mmHg.   9) chronic thrombocytopenia--- viral infection may be contributing somewhat, monitor closely no bleeding concerns at this time -Patient had episode of thrombocytopenia dating back to 2019  Status is: Inpatient   Disposition: The patient is from: Home              Anticipated d/c is to: Home with [facility]              Anticipated d/c date is: 2 days              Patient currently is not medically stable to d/c. Barriers: Not Clinically Stable-   Code Status :  -  Code Status: DNR   Family Communication:   (patient is alert, awake and coherent)  Discussed with daughter at bedside  DVT Prophylaxis  :   - SCDs  enoxaparin (LOVENOX) injection 40 mg Start: 08/25/22 1000   Lab Results  Component Value Date   PLT 124 (L) 08/25/2022   Inpatient Medications  Scheduled Meds:  dextromethorphan-guaiFENesin  1 tablet Oral BID   diltiazem  30 mg Oral TID   enoxaparin (LOVENOX) injection  40 mg Subcutaneous Q24H   methylPREDNISolone (SOLU-MEDROL) injection  40 mg Intravenous Q12H   midodrine  10 mg Oral TID WC   mometasone-formoterol  2 puff Inhalation BID   pantoprazole  40 mg Oral Daily   traZODone  100 mg Oral QHS   umeclidinium bromide  1 puff Inhalation Daily   Continuous Infusions: PRN Meds:.acetaminophen, albuterol   Anti-infectives (From admission, onward)    None         Subjective: Travis Murphy today has no fevers, no emesis,  No chest pain,   -Daughter at bedside -Went into A-fib overnight -Unable to wean off O2 -- cough and dyspnea persist No fever  Or chills    Objective: Vitals:   08/26/22 0946 08/26/22 0948 08/26/22 1359 08/26/22 1422  BP: (!) 78/46 (!) 85/57 (!) 91/55 (!) 88/63  Pulse: 98 100 81 77  Resp: '20 20 20 20  '$ Temp: 97.9 F (36.6 C) 97.9 F (36.6 C) (!) 97.3 F (36.3 C)   TempSrc:   Oral   SpO2: 90% 97% (!) 89% 94%  Weight:        Intake/Output Summary (Last 24 hours) at 08/26/2022 1553 Last data filed at 08/26/2022 0949 Gross per 24 hour  Intake 720 ml  Output 450 ml  Net 270 ml   Filed Weights   08/24/22 1721  Weight: 55.3 kg    Physical Exam  Gen:- Awake Alert, dyspnea on exertion but no conversational dyspnea HEENT:- [place].AT, No sclera icterus Nose- [place] 2L/min Neck-Supple Neck,No JVD,.  Lungs-diminished breath sounds, few scattered wheezes bilaterally  CV- S1, S2 normal, irregularly irregular abd-  +ve B.Sounds, Abd Soft, No tenderness,    Extremity/Skin:- No  edema, pedal pulses present  Psych-affect is appropriate, oriented x3 Neuro-generalized weakness, no new focal deficits, no tremors  Data Reviewed: I have personally reviewed following labs and imaging studies  CBC: Recent Labs  Lab 08/24/22 2136 08/25/22 0626  WBC 7.1 6.9  NEUTROABS 4.8  --   HGB 13.2 13.2  HCT 39.4 39.7  MCV 95.2 95.4  PLT 123* 341*   Basic Metabolic Panel: Recent Labs  Lab 08/24/22 2136 08/25/22 0626  NA 135 137  K 3.5 3.7  CL 103 104  CO2 27 27  GLUCOSE 83 86  BUN 17 17  CREATININE 0.93 0.83  CALCIUM 7.8* 7.9*   GFR: Estimated Creatinine Clearance: 50.9 mL/min (by C-G formula based on SCr of 0.83 mg/dL).  Recent Results (from the past 240 hour(s))  Resp panel by RT-PCR (RSV, Flu A&B, Covid) Anterior Nasal Swab     Status: Abnormal   Collection Time: 08/24/22  5:27 PM   Specimen: Anterior Nasal Swab  Result Value Ref Range Status   SARS Coronavirus 2 by RT PCR NEGATIVE NEGATIVE Final    Comment: (NOTE) SARS-CoV-2 target nucleic acids are NOT DETECTED.  The SARS-CoV-2 RNA is generally detectable in upper  respiratory specimens during the acute phase of infection. The lowest concentration of SARS-CoV-2 viral copies this assay can detect is 138 copies/mL. A negative result does not preclude SARS-Cov-2 infection and should not be used as the sole basis for treatment or other patient management decisions. A negative result may occur with  improper specimen collection/handling, submission of specimen other than nasopharyngeal swab, presence of viral mutation(s) within the areas targeted by this assay, and inadequate number of viral copies(<138 copies/mL). A negative result must be combined with clinical observations, patient history, and epidemiological information. The expected result is Negative.  Fact Sheet for Patients:  EntrepreneurPulse.com.au  Fact Sheet for Healthcare  Providers:  IncredibleEmployment.be  This test is no t yet approved or cleared by the [place] and  has been authorized for detection and/or diagnosis of SARS-CoV-2 by FDA under an Emergency Use Authorization (EUA). This EUA will remain  in effect (meaning this test can be used) for the duration of the COVID-19 declaration under Section 564(b)(1) of the Act, 21 U.S.C.section 360bbb-3(b)(1), unless the authorization is terminated  or revoked sooner.       Influenza A by PCR NEGATIVE NEGATIVE Final   Influenza B by PCR NEGATIVE NEGATIVE Final    Comment: (NOTE) The Xpert Xpress SARS-CoV-2/FLU/RSV plus assay is intended as an aid in the diagnosis of influenza from Nasopharyngeal swab specimens and should not be used as a sole basis for treatment. Nasal washings and aspirates are unacceptable for Xpert Xpress SARS-CoV-2/FLU/RSV testing.  Fact Sheet for Patients: EntrepreneurPulse.com.au  Fact Sheet for Healthcare Providers: IncredibleEmployment.be  This test is not yet approved or cleared by the [place] FDA and has been  authorized for detection and/or diagnosis of SARS-CoV-2 by FDA under an Emergency Use Authorization (EUA). This EUA will remain in effect (meaning this test can be used) for the duration of the COVID-19 declaration under Section 564(b)(1) of the Act, 21 U.S.C. section 360bbb-3(b)(1), unless the authorization is terminated or revoked.     Resp Syncytial Virus by PCR POSITIVE (A) NEGATIVE Final    Comment: (NOTE) Fact Sheet for Patients: EntrepreneurPulse.com.au  Fact Sheet for Healthcare Providers: IncredibleEmployment.be  This test is not yet approved or cleared by the [place] FDA and has been authorized for detection and/or diagnosis of SARS-CoV-2 by FDA under an Emergency Use Authorization (EUA). This EUA will remain in effect (meaning this test can be used) for the duration of the COVID-19 declaration under Section 564(b)(1) of the Act, 21 U.S.C. section 360bbb-3(b)(1), unless the authorization is terminated or revoked.  Performed at [facility], [address] [place], [place] [address]       Radiology Studies: DG Chest 2 View  Result Date: 08/24/2022 CLINICAL DATA:  Shortness of breath. EXAM: CHEST - 2 VIEW COMPARISON:  One-view chest x-ray 07/11/2021. CTA chest 07/15/2022. Two-view chest x-ray 03/07/2021 FINDINGS: Heart size is normal. Changes of COPD are again noted. Chronic interstitial coarsening and scarring is present. The overall interstitial pattern is slightly increased, particularly in the right lower lobe. No significant airspace consolidation is present. Exaggerated thoracic kyphosis is present. IMPRESSION: 1. Chronic interstitial coarsening and scarring. 2. Slight increase in interstitial pattern, particularly in the right lower lobe. This most likely represents mild edema. Electronically Signed   By: San Morelle M.D.   On: 08/24/2022 18:02     Scheduled Meds:  dextromethorphan-guaiFENesin  1 tablet Oral BID    diltiazem  30 mg Oral TID   enoxaparin (LOVENOX) injection  40 mg Subcutaneous Q24H   methylPREDNISolone (SOLU-MEDROL) injection  40 mg Intravenous Q12H   midodrine  10 mg Oral TID WC   mometasone-formoterol  2 puff Inhalation BID   pantoprazole  40 mg Oral Daily   traZODone  100 mg Oral QHS   umeclidinium bromide  1 puff Inhalation Daily   Continuous Infusions:   LOS: 0 days    Roxan Hockey M.D on 08/26/2022 at 3:53 PM  Go to [email or web address] - for contact info  [facility] - Office  [phone]  If 7PM-7AM, please contact night-coverage [email or web address] 08/26/2022, 3:53 PM

## 2022-08-27 DIAGNOSIS — R7989 Other specified abnormal findings of blood chemistry: Secondary | ICD-10-CM | POA: Clinically undetermined

## 2022-08-27 LAB — RENAL FUNCTION PANEL
Albumin: 2.5 g/dL — ABNORMAL LOW (ref 3.5–5.0)
Anion gap: 7 (ref 5–15)
BUN: 34 mg/dL — ABNORMAL HIGH (ref 8–23)
CO2: 29 mmol/L (ref 22–32)
Calcium: 8.2 mg/dL — ABNORMAL LOW (ref 8.9–10.3)
Chloride: 103 mmol/L (ref 98–111)
Creatinine, Ser: 1.07 mg/dL (ref 0.61–1.24)
GFR, Estimated: >60 mL/min (ref >60)
Glucose, Bld: 128 mg/dL — ABNORMAL HIGH (ref 70–99)
Phosphorus: 3.6 mg/dL (ref 2.5–4.6)
Potassium: 4.2 mmol/L (ref 3.5–5.1)
Sodium: 139 mmol/L (ref 135–145)

## 2022-08-27 LAB — CORTISOL-AM, BLOOD: Cortisol - AM: 2.9 ug/dL — ABNORMAL LOW (ref 6.7–22.6)

## 2022-08-27 MED ORDER — ASPIRIN EC 81 MG PO TBEC: 81.0000 mg | DELAYED_RELEASE_TABLET | Freq: Every day | ORAL | 11 refills | Status: DC

## 2022-08-27 MED ORDER — TAMSULOSIN HCL 0.4 MG PO CAPS: 0.4000 mg | ORAL_CAPSULE | Freq: Every day | ORAL | 0 refills | Status: DC

## 2022-08-27 MED ORDER — ACETAMINOPHEN 325 MG PO TABS: 650.0000 mg | ORAL_TABLET | Freq: Four times a day (QID) | ORAL | 0 refills | Status: DC | PRN

## 2022-08-27 MED ORDER — ENSURE ENLIVE PO LIQD: 237.0000 mL | Freq: Two times a day (BID) | ORAL | Status: DC

## 2022-08-27 MED ORDER — DM-GUAIFENESIN ER 30-600 MG PO TB12: 1.0000 | ORAL_TABLET | Freq: Two times a day (BID) | ORAL | 0 refills | Status: AC

## 2022-08-27 MED ORDER — MOMETASONE FURO-FORMOTEROL FUM 200-5 MCG/ACT IN AERO: 2.0000 | INHALATION_SPRAY | Freq: Two times a day (BID) | RESPIRATORY_TRACT | 2 refills | Status: DC

## 2022-08-27 MED ORDER — ADULT MULTIVITAMIN W/MINERALS CH
1.0000 | ORAL_TABLET | Freq: Every day | ORAL | Status: DC
  Administered 2022-08-27: 1 via ORAL
  Filled 2022-08-27: qty 1

## 2022-08-27 MED ORDER — MIDODRINE HCL 10 MG PO TABS: 10.0000 mg | ORAL_TABLET | Freq: Three times a day (TID) | ORAL | 1 refills | Status: DC

## 2022-08-27 MED ORDER — ALBUTEROL SULFATE (2.5 MG/3ML) 0.083% IN NEBU: 2.5000 mg | INHALATION_SOLUTION | RESPIRATORY_TRACT | 2 refills | Status: DC | PRN

## 2022-08-27 MED ORDER — COMPRESSOR/NEBULIZER MISC: 1.0000 [IU] | Freq: Every day | 0 refills | Status: DC | PRN

## 2022-08-27 MED ORDER — NICOTINE 14 MG/24HR TD PT24: 14.0000 mg | MEDICATED_PATCH | Freq: Every day | TRANSDERMAL | 0 refills | Status: DC

## 2022-08-27 MED ORDER — PREDNISONE 20 MG PO TABS: 40.0000 mg | ORAL_TABLET | Freq: Every day | ORAL | 0 refills | Status: AC

## 2022-08-27 MED ORDER — DILTIAZEM HCL 30 MG PO TABS: 30.0000 mg | ORAL_TABLET | Freq: Two times a day (BID) | ORAL | 0 refills | Status: DC | PRN

## 2022-08-27 NOTE — Progress Notes (Signed)
Pt pleasant and cooperative this shift. No c/o pain or discomfort. Call bell within reach. All needs met. Plan of care ongoing.

## 2022-08-27 NOTE — Progress Notes (Signed)
Physical Therapy Treatment Patient Details Name: Travis Murphy MRN: [id number] DOB: Jan 08, 1935 Today's Date: 08/27/2022   History of Present Illness PT to ER witn SOB, DX witn RSV.    PT Comments    Patient seated in chair at beginning of session. O2 sat monitored throughout ambulation on room air with decrease into 83-85% range with minimal SOB but the range may not be accurate as there was poor wave form intermittently with afib present. Patient able to ambulate increased distance with RW without loss of balance. Patient returned to room at [place]. Patient will benefit from continued skilled physical therapy in hospital and recommended venue below to increase strength, balance, endurance for safe ADLs and gait.   Recommendations for follow up therapy are one component of a multi-disciplinary discharge planning process, led by the attending physician.  Recommendations may be updated based on patient status, additional functional criteria and insurance authorization.  Follow Up Recommendations  Home health PT     Assistance Recommended at Discharge    Patient can return home with the following A little help with walking and/or transfers;A little help with bathing/dressing/bathroom;Assistance with cooking/housework   Equipment Recommendations  Rolling walker (2 wheels)    Recommendations for Other Services       Precautions / Restrictions Precautions Precautions: Fall Restrictions Weight Bearing Restrictions: No     Mobility  Bed Mobility               General bed mobility comments: seated in chair at beginning of beginning of session    Transfers Overall transfer level: Modified independent Equipment used: Rolling walker (2 wheels)               General transfer comment: STS with RW    Ambulation/Gait Ambulation/Gait assistance: Supervision, Min guard Gait Distance (Feet): 80 Feet Assistive device: Rolling walker (2 wheels) Gait Pattern/deviations:  Decreased step length - right, Decreased step length - left       General Gait Details: ambulates with RW, no loss of balance   Stairs             Wheelchair Mobility    Modified Rankin (Stroke Patients Only)       Balance Overall balance assessment: Mild deficits observed, not formally tested                                          Cognition Arousal/Alertness: Awake/alert Behavior During Therapy: WFL for tasks assessed/performed Overall Cognitive Status: Within Functional Limits for tasks assessed                                          Exercises      General Comments        Pertinent Vitals/Pain Pain Assessment Pain Assessment: No/denies pain    Home Living                          Prior Function            PT Goals (current goals can now be found in the care plan section) Progress towards PT goals: Progressing toward goals    Frequency    Min 2X/week      PT Plan Current plan remains appropriate    Co-evaluation  AM-PAC PT "6 Clicks" Mobility   Outcome Measure  Help needed turning from your back to your side while in a flat bed without using bedrails?: None Help needed moving from lying on your back to sitting on the side of a flat bed without using bedrails?: None Help needed moving to and from a bed to a chair (including a wheelchair)?: None Help needed standing up from a chair using your arms (e.g., wheelchair or bedside chair)?: None Help needed to walk in hospital room?: A Little Help needed climbing 3-5 steps with a railing? : A Lot 6 Click Score: 21    End of Session Equipment Utilized During Treatment: Gait belt Activity Tolerance: Patient tolerated treatment well Patient left: with family/visitor present;in chair;Other (comment) (with MD) Nurse Communication: Mobility status PT Visit Diagnosis: Unsteadiness on feet (R26.81)     Time: 9150-5697 PT Time  Calculation (min) (ACUTE ONLY): 17 min  Charges:  $Therapeutic Activity: 8-22 mins                     3:01 PM, 08/27/22 Mearl Latin PT, DPT Physical Therapist at [facility]

## 2022-08-27 NOTE — Progress Notes (Addendum)
SATURATION QUALIFICATIONS: (This note is used to comply with regulatory documentation for home oxygen)  Patient Saturations on Room Air at Rest = 93%  Patient Saturations on Room Air while Ambulating = 89 to 90 %  - -Due to fluctuating and inconsistent Pulsoxymeter data previously--I evaluated patient with patient's daughter Travis Murphy present at bedside-- Reevaluated patient's oxygen saturations at rest and with ambulation----patient afebrile and when he ambulates heart rate and O2 sats fluctuates as the Pulsoxymeter does not pick up well - After adjusting Pulsoxymeter--- O2 sats monitoring with ambulation at rest as noted above - Patient does not meet criteria for home oxygen based on above - Patient's daughter Travis Murphy was present at bedside during evaluation  Roxan Hockey, MD

## 2022-08-27 NOTE — Discharge Summary (Signed)
Travis Murphy, is a 88 y.o. male  DOB Apr 16, 1935  MRN [id number].  Admission date:  08/24/2022  Admitting Physician  Roxan Hockey, MD  Discharge Date:  08/27/2022   Primary MD  Asencion Noble, MD  Recommendations for primary care physician for things to follow:  1)Stop Avapro/Irbesartan due to low Blood Pressure 2)follow up cardiologist to discuss possible management including anticoagulation/blood thinners for your atrial fibrillation.  You may need Zio patch/heart monitor for up to 30 days if atrial fibrillation becomes intermittent 3) Cardizem 30 mg up to every 12 hours as needed if your heart rate becomes very fast and goes above 120 beats per minute 4)Please avoid strenuous activity no driving until you talk to your cardiologist in about 2 to 3 weeks or so 5)Your serum cortisol levels are low --- please follow-up with endocrinologist Dr. Loni Beckwith, MD,-in 2 to 3 weeks for reevaluation  -Endocrinology, Diabetes & Metabolism-- [facility], [address] [place], [place] [address], Phone Number- [phone] 6) avoid excessive caffeine intake as this may make your heart rate go up 7) abstinence from tobacco advised okay to use over-the-counter nicotine patch-  Admission Diagnosis  RSV (respiratory syncytial virus pneumonia) [J12.1] Acute respiratory failure with hypoxia ([place]) [J96.01] RSV infection [B33.8]   Discharge Diagnosis  RSV (respiratory syncytial virus pneumonia) [J12.1] Acute respiratory failure with hypoxia ([place]) [J96.01] RSV infection [B33.8]    Principal Problem:   RSV (respiratory syncytial virus pneumonia) Active Problems:   Acute respiratory failure with hypoxia ([place])   New onset a-fib ([place])   RSV (respiratory syncytial virus infection)   Low serum cortisol level/AM Cortisol Level is 2.9-   Essential hypertension   S/P TAVR (transcatheter aortic valve replacement)    COPD GOLD 2 / still smoking      Past Medical History:  Diagnosis Date   Arthritis    Essential hypertension    Hyperlipemia    Hyperplastic colon polyp 2002   Dr. Arnoldo Morale   Pulmonary nodule    a. right lower lobe. 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 severe AS by echo in 2017 b. 01/2018: repeat echo showing EF of 55-60%, Grade 2 DD, and severe AS with mean gradient of 58 mm Hg   Vertigo     Past Surgical History:  Procedure Laterality Date   CATARACT EXTRACTION Right    COLONOSCOPY  08/17/2001   Small polyp measured 3 mm removed/multiple middle scattered diverticula in the ascending colon-hyperplastic   COLONOSCOPY  08/14/2011   Procedure: COLONOSCOPY;  Surgeon: Daneil Dolin, MD;  [facility];  Service: Endoscopy;  Laterality: N/A;  10:00   EXCISIONAL HEMORRHOIDECTOMY     INTRAOPERATIVE TRANSTHORACIC ECHOCARDIOGRAM N/A 04/21/2018   Procedure: INTRAOPERATIVE TRANSTHORACIC ECHOCARDIOGRAM;  Surgeon: Burnell Blanks, MD;  Location: [place];  Service: Open Heart Surgery;  Laterality: N/A;   RIGHT/LEFT HEART CATH AND CORONARY ANGIOGRAPHY N/A 03/25/2018   Procedure: RIGHT/LEFT HEART CATH AND CORONARY ANGIOGRAPHY;  Surgeon: Burnell Blanks, MD;  Location: [place] [facility];  Service: Cardiovascular;  Laterality: N/A;   TRANSCATHETER AORTIC VALVE REPLACEMENT, TRANSFEMORAL  04/21/2018   TRANSCATHETER AORTIC VALVE REPLACEMENT, TRANSFEMORAL N/A 04/21/2018   Procedure: TRANSCATHETER AORTIC VALVE REPLACEMENT, TRANSFEMORAL;  Surgeon: Burnell Blanks, MD;  Location: [place];  Service: Open Heart Surgery;  Laterality: N/A;     HPI  from the history and physical done on the day of admission:   HPI: Travis Murphy is a 88 y.o. male with medical history  significant of COPD, severe AS s/p TAVR, HTN.   Pt presents to ED with c/o SOB and decreased O2 sats.  Pt not on O2 at baseline.   Pt with h/o dementia.   Feels bad "like he has the flu or something" for past 1 week.   Non-productive cough.   Per daughters at bedside, patient has a history of vascular dementia and has been less active. He urinated on himself today and per the daughters/girlfriend has seemed weaker than normal. Had some increased confusion, forgetting what he was doing, etc. Patient denies f/c, CP, SOB, abd pain, N/V/D/C, urinary symptoms. Denies recent falls.   Patient denies SOB but is tachypneic on exam into the upper 20s satting 89% on RA. Does not wear oxygen at home.        Review of Systems: As mentioned in the history of present illness. All other systems reviewed and are negative.     Hospital Course:     Brief Summary:-  88 y.o. male with medical history significant of COPD, severe AS s/p TAVR, HTN and ongoing tobacco abuse admitted on 08/25/2022 with acute hypoxic respiratory failure in the setting of RSV infection and underlying COPD   A/p 1) RSV bronchitis----with significant respiratory symptoms and hypoxia -Treated with bronchodilators and anti-tussives -Hypoxia resolved -Weaned off oxygen -Respiratory symptoms/respiratory status improved significantly -Continue bronchodilators, antitussives and supportive and symptomatic treatment   2) acute COPD exacerbation--- secondary to #1 above -Treated with IV Solu-Medrol , mucolytics and bronchodilators -Much improved as outlined above in #1 -Smoking cessation strongly advised   3)acute hypoxic respiratory failure--- -Secondary to #1 #2 above -Hypoxia resolved with improvement in respiratory status as outlined in #1 -  4)New Onset Afib--on 08/26/21 went into Afib with RVR in the setting of acute illness/acute hypoxic respiratory failure due to RSV and COPD - CHA2DS2- VASc score   is = 4 (HTN, age x 2,  CHF) Which is  equal to =4.8  % annual risk of stroke    This patients CHA2DS2-VASc Score and unadjusted Ischemic Stroke Rate (% per year) is equal to 4.8 % stroke rate/year from a score of 4 HASBLED score with 3.4% risk of bleeding -Discussed with patient and daughter at bedside hold off on anticoagulation at this time -Okay to use Cardizem for rate control with BP parameters,  May use midodrine for soft BP -Stop Avapro due to soft blood pressure -Plan of care and risk versus benefits of anticoagulation discussed with patient and patient's daughters at bedside -Follow-up with cardiology to discuss risk versus benefit of anticoagulation and further management of A-fib -Patient and family would like to hold off on DOAC at this time until further conversations with the cardiology team   5)Generalized weakness and deconditioning--- therapy eval appreciated recommends home health physical therapy  6)GERD--PPI especially while on steroids   7)HFpEF--echo from 08/14/2021 with EF of 55 to 60% and grade 2 diastolic dysfunction -Chronic diastolic dysfunction CHF without obvious acute exacerbation at this time -Appears euvolemic   8)History of severe aortic stenosis--- status post prior TAVR -Echo from 08/14/2022 shows No aortic stenosis is present. There is a 26 mm Sapien prosthetic (TAVR) valve present in the aortic position. Aortic regurgitation PHT measures 500 msec. Aortic valve mean gradient measures 9.5 mmHg.    9) chronic thrombocytopenia--- viral infection may be contributing somewhat, monitor closely no bleeding concerns at this time -Patient had episode of thrombocytopenia dating back to 2019   Disposition: The patient is from: Home              Anticipated d/c is to: Home with [facility]  Discharge Condition: Stable without further hypoxia  Follow UP--cardiology   Follow-up Information     [facility] Follow up.   Specialty: Home Health Services Why: PT will call to  schedule your first home visit. Contact information: [address] [place] [address] [phone]         [facility] Follow up.   Why: [place] information: [address] [address] [phone]         Cassandria Anger, MD. Schedule an appointment as soon as possible for a visit in 3 week(s).   Specialty: Endocrinology Why: Low Cortisol Level Contact information: [place] [address] [phone]         Satira Sark, MD. Schedule an appointment as soon as possible for a visit in 2 week(s).   Specialty: Cardiology Why: -To discuss new onset atrial fibrillation Contact information: [place] [address] [phone]                 Diet and Activity recommendation:  As advised  Discharge Instructions    Discharge Instructions     Call MD for:  difficulty breathing, headache or visual disturbances   Complete by: As directed    Call MD for:  persistant dizziness or light-headedness   Complete by: As directed    Call MD for:  persistant nausea and vomiting   Complete by: As directed    Call MD for:  temperature >100.4   Complete by: As directed    Diet - low sodium heart healthy   Complete by: As directed    Discharge instructions   Complete by: As directed    1)Stop Avapro/Irbesartan due to low Blood Pressure 2)follow up cardiologist to discuss possible management including anticoagulation/blood thinners for your atrial fibrillation.  You may need Zio patch/heart monitor for up to 30 days if atrial fibrillation becomes intermittent 3) Cardizem 30 mg up to every 12 hours as needed if your heart rate becomes very fast and goes above 120 beats per minute 4)Please avoid strenuous activity no driving until you talk to your cardiologist in about 2 to 3 weeks or so 5)Your serum cortisol levels are low --- please follow-up with endocrinologist  Dr. Loni Beckwith, MD,-in 2 to 3 weeks for reevaluation  -Endocrinology, Diabetes & Metabolism-- [facility], [address] [place], [place] [address], Phone Number- [phone] 6) avoid excessive caffeine intake as this may make your heart rate go up 7) abstinence from tobacco advised okay to use over-the-counter nicotine patch-   For home use only DME Nebulizer machine   Complete by: As directed  Patient needs a nebulizer to treat with the following condition: COPD with acute exacerbation ([place])   Length of Need: Lifetime   Increase activity slowly   Complete by: As directed        Discharge Medications     Allergies as of 08/27/2022   No Known Allergies      Medication List     STOP taking these medications    irbesartan 150 MG tablet Commonly known as: Avapro       TAKE these medications    acetaminophen 325 MG tablet Commonly known as: TYLENOL Take 2 tablets (650 mg total) by mouth every 6 (six) hours as needed for mild pain, fever or headache.   albuterol (2.5 MG/3ML) 0.083% nebulizer solution Commonly known as: PROVENTIL Take 3 mLs (2.5 mg total) by nebulization every 4 (four) hours as needed for wheezing or shortness of breath.   aspirin EC 81 MG tablet Take 1 tablet (81 mg total) by mouth daily with breakfast. What changed: when to take this   atorvastatin 10 MG tablet Commonly known as: LIPITOR Take 10 mg by mouth daily.   Compressor/Nebulizer Misc 1 Units by Does not apply route daily as needed.   cyanocobalamin 1000 MCG tablet Commonly known as: VITAMIN B12 Take 1,000 mcg by mouth daily.   dextromethorphan-guaiFENesin 30-600 MG 12hr tablet Commonly known as: MUCINEX DM Take 1 tablet by mouth 2 (two) times daily for 10 days.   diltiazem 30 MG tablet Commonly known as: Cardizem Take 1 tablet (30 mg total) by mouth every 12 (twelve) hours as needed (palpitations and heart rate over 120 bpm).   donepezil 10 MG tablet Commonly known as:  ARICEPT Take 10 mg by mouth every morning.   famotidine 20 MG tablet Commonly known as: Pepcid One after supper What changed:  how much to take how to take this when to take this additional instructions   meclizine 25 MG tablet Commonly known as: ANTIVERT Take 25 mg by mouth 3 (three) times daily as needed for dizziness.   midodrine 10 MG tablet Commonly known as: PROAMATINE Take 1 tablet (10 mg total) by mouth 3 (three) times daily with meals.   mometasone-formoterol 200-5 MCG/ACT Aero Commonly known as: DULERA Inhale 2 puffs into the lungs 2 (two) times daily. Shake well Rinse mouth with water and spit following inhalation.   nicotine 14 mg/24hr patch Commonly known as: NICODERM CQ - dosed in mg/24 hours Place 1 patch (14 mg total) onto the skin daily for 28 days.   pantoprazole 40 MG tablet Commonly known as: PROTONIX TAKE 1 TABLET (40 MG TOTAL) BY MOUTH DAILY. TAKE 30-60 MIN BEFORE FIRST MEAL OF THE DAY   predniSONE 20 MG tablet Commonly known as: DELTASONE Take 2 tablets (40 mg total) by mouth daily with breakfast for 5 days.   tamsulosin 0.4 MG Caps capsule Commonly known as: FLOMAX Take 1 capsule (0.4 mg total) by mouth daily after supper. What changed: when to take this   Wixela Inhub 250-50 MCG/ACT Aepb Generic drug: fluticasone-salmeterol Inhale 1 puff into the lungs 2 (two) times daily.               Durable Medical Equipment  (From admission, onward)           Start     Ordered   08/27/22 0000  For home use only DME Nebulizer machine       Question Answer Comment  Patient needs a nebulizer to treat with the following  condition COPD with acute exacerbation ([place])   Length of Need Lifetime      08/27/22 1629            Major procedures and Radiology Reports - PLEASE review detailed and final reports for all details, in brief -   DG Chest 2 View  Result Date: 08/24/2022 CLINICAL DATA:  Shortness of breath. EXAM: CHEST - 2 VIEW  COMPARISON:  One-view chest x-ray 07/11/2021. CTA chest 07/15/2022. Two-view chest x-ray 03/07/2021 FINDINGS: Heart size is normal. Changes of COPD are again noted. Chronic interstitial coarsening and scarring is present. The overall interstitial pattern is slightly increased, particularly in the right lower lobe. No significant airspace consolidation is present. Exaggerated thoracic kyphosis is present. IMPRESSION: 1. Chronic interstitial coarsening and scarring. 2. Slight increase in interstitial pattern, particularly in the right lower lobe. This most likely represents mild edema. Electronically Signed   By: San Morelle M.D.   On: 08/24/2022 18:02   ECHOCARDIOGRAM COMPLETE  Result Date: 08/14/2022    ECHOCARDIOGRAM REPORT   Patient Name:   Travis Murphy [facility] Date of Exam: 08/14/2022 Medical Rec #:  [id number]             Height:       66.0 in Accession #:    [id number]            Weight:       126.6 lb Date of Birth:  Aug 22, 1935             BSA:          1.647 m Patient Age:    75 years              BP:           128/66 mmHg Patient Gender: M                     HR:           70 bpm. Exam Location:  [facility] Procedure: 2D Echo, Cardiac Doppler and Color Doppler Indications:    I35.0 (ICD-10-CM) - Aortic valve stenosis, etiology of cardiac                 valve disease unspecified  History:        Patient has prior history of Echocardiogram examinations, most                 recent 05/21/2021. Risk Factors:Hypertension, Dyslipidemia and                 Former Smoker. TAVR (transcatheter aortic valve replacement), Hx                 of Acute hypoxemic respiratory failure due to COVID-19.                 Aortic Valve: 26 mm Sapien prosthetic, stented (TAVR) valve is                 present in the aortic position.                 Aortic Valve: 26 mm Sapien prosthetic, stented (TAVR) valve is                 present in the aortic position.  Sonographer:    Alvino Chapel RCS Referring Phys: [id number]  Oval  1. Left ventricular ejection fraction, by estimation, is 55 to 60%. The left ventricle has normal function.  The left ventricle has no regional wall motion abnormalities. Left ventricular diastolic parameters are consistent with Grade II diastolic dysfunction (pseudonormalization).  2. Right ventricular systolic function is normal. The right ventricular size is normal.  3. Left atrial size was moderately dilated.  4. The mitral valve is degenerative. Mild mitral valve regurgitation. No evidence of mitral stenosis.  5. The aortic valve has been repaired/replaced. Aortic valve regurgitation is moderate visually but mild by PHT of 500 msec likely from suboptimal alignment of the CW probe. No aortic stenosis is present. There is a 26 mm Sapien prosthetic (TAVR) valve present in the aortic position. Aortic regurgitation PHT measures 500 msec. Aortic valve mean gradient measures 9.5 mmHg. Comparison(s): No significant change from prior study. FINDINGS  Left Ventricle: Left ventricular ejection fraction, by estimation, is 55 to 60%. The left ventricle has normal function. The left ventricle has no regional wall motion abnormalities. The left ventricular internal cavity size was normal in size. There is  borderline left ventricular hypertrophy. Left ventricular diastolic parameters are consistent with Grade II diastolic dysfunction (pseudonormalization). Right Ventricle: The right ventricular size is normal. No increase in right ventricular wall thickness. Right ventricular systolic function is normal. Left Atrium: Left atrial size was moderately dilated. Right Atrium: Right atrial size was normal in size. Pericardium: There is no evidence of pericardial effusion. Mitral Valve: The mitral valve is degenerative in appearance. Mild mitral valve regurgitation. No evidence of mitral valve stenosis. Tricuspid Valve: The tricuspid valve is grossly normal. Tricuspid valve regurgitation is not  demonstrated. No evidence of tricuspid stenosis. Aortic Valve: The aortic valve has been repaired/replaced. Aortic valve regurgitation is moderate. Aortic regurgitation PHT measures 500 msec. No aortic stenosis is present. Aortic valve mean gradient measures 9.5 mmHg. Aortic valve peak gradient measures 21.2 mmHg. Aortic valve area, by VTI measures 1.20 cm. There is a 26 mm Sapien prosthetic, stented (TAVR) valve present in the aortic position. Pulmonic Valve: The pulmonic valve was not well visualized. Pulmonic valve regurgitation is not visualized. No evidence of pulmonic stenosis. Aorta: The aortic root is normal in size and structure. Venous: The inferior vena cava was not well visualized. IAS/Shunts: No atrial level shunt detected by color flow Doppler.  LEFT VENTRICLE PLAX 2D LVIDd:         4.00 cm   Diastology LVIDs:         2.40 cm   LV e' medial:    6.53 cm/s LV PW:         1.10 cm   LV E/e' medial:  19.4 LV IVS:        1.20 cm   LV e' lateral:   6.09 cm/s LVOT diam:     1.70 cm   LV E/e' lateral: 20.9 LV SV:         67 LV SV Index:   40 LVOT Area:     2.27 cm  RIGHT VENTRICLE RV S prime:     13.50 cm/s TAPSE (M-mode): 2.3 cm LEFT ATRIUM             Index        RIGHT ATRIUM           Index LA diam:        4.10 cm 2.49 cm/m   RA Area:     16.20 cm LA Vol (A2C):   91.8 ml 55.75 ml/m  RA Volume:   41.20 ml  25.02 ml/m LA Vol (A4C):   56.0 ml 34.01 ml/m  LA Biplane Vol: 77.9 ml 47.31 ml/m  AORTIC VALVE AV Area (Vmax):    1.13 cm AV Area (Vmean):   1.26 cm AV Area (VTI):     1.20 cm AV Vmax:           230.00 cm/s AV Vmean:          140.500 cm/s AV VTI:            0.554 m AV Peak Grad:      21.2 mmHg AV Mean Grad:      9.5 mmHg LVOT Vmax:         114.00 cm/s LVOT Vmean:        78.300 cm/s LVOT VTI:          0.293 m LVOT/AV VTI ratio: 0.53 AI PHT:            500 msec  AORTA Ao Root diam: 3.10 cm MITRAL VALVE MV Area (PHT): 2.09 cm     SHUNTS MV Decel Time: 363 msec     Systemic VTI:  0.29 m MV E  velocity: 127.00 cm/s  Systemic Diam: 1.70 cm MV A velocity: 105.00 cm/s MV E/A ratio:  1.21 Vishnu Priya Mallipeddi Electronically signed by Lorelee Cover Mallipeddi Signature Date/Time: 08/14/2022/2:00:13 PM    Final    CT ANGIO CHEST AORTA W &/OR WO CONTRAST  Result Date: 08/14/2022 CLINICAL DATA:  Aortic aneurysm EXAM: CT ANGIOGRAPHY CHEST WITH CONTRAST TECHNIQUE: Multidetector CT imaging of the chest was performed using the standard protocol during bolus administration of intravenous contrast. Multiplanar CT image reconstructions and MIPs were obtained to evaluate the vascular anatomy. RADIATION DOSE REDUCTION: This exam was performed according to the departmental dose-optimization program which includes automated exposure control, adjustment of the mA and/or kV according to patient size and/or use of iterative reconstruction technique. CONTRAST:  80 mL OMNIPAQUE IOHEXOL 350 MG/ML SOLN COMPARISON:  08/09/2021 FINDINGS: Cardiovascular: Preferential opacification of the thoracic aorta. Normal heart size. No pericardial effusion. Ascending thoracic aortic aneurysm maximal outer diameter 4.2 cm represents no change. No dissection identified. Mediastinum/Nodes: Stable right paratracheal adenopathy identified with a node measuring 1.3 cm. Lungs/Pleura: Extensive emphysematous scarring. Dependent basilar subsegmental atelectasis. No pleural effusion or pneumothorax. Upper Abdomen: No acute abnormality. Musculoskeletal: No chest wall abnormality. No acute or significant osseous findings. There is osteopenia. There are thoracic degenerative changes. Review of the MIP images confirms the above findings. IMPRESSION: 1. Ectatic ascending aorta with a diameter 4.2 cm which is unchanged. 2. Extensive emphysematous scarring. 3. Stable nonspecific mediastinal adenopathy. Electronically Signed   By: Sammie Bench M.D.   On: 08/14/2022 12:22    Micro Results   Recent Results (from the past 240 hour(s))  Resp panel by  RT-PCR (RSV, Flu A&B, Covid) Anterior Nasal Swab     Status: Abnormal   Collection Time: 08/24/22  5:27 PM   Specimen: Anterior Nasal Swab  Result Value Ref Range Status   SARS Coronavirus 2 by RT PCR NEGATIVE NEGATIVE Final    Comment: (NOTE) SARS-CoV-2 target nucleic acids are NOT DETECTED.  The SARS-CoV-2 RNA is generally detectable in upper respiratory specimens during the acute phase of infection. The lowest concentration of SARS-CoV-2 viral copies this assay can detect is 138 copies/mL. A negative result does not preclude SARS-Cov-2 infection and should not be used as the sole basis for treatment or other patient management decisions. A negative result may occur with  improper specimen collection/handling, submission of specimen other than nasopharyngeal swab, presence of viral  mutation(s) within the areas targeted by this assay, and inadequate number of viral copies(<138 copies/mL). A negative result must be combined with clinical observations, patient history, and epidemiological information. The expected result is Negative.  Fact Sheet for Patients:  EntrepreneurPulse.com.au  Fact Sheet for Healthcare Providers:  IncredibleEmployment.be  This test is no t yet approved or cleared by the [place] FDA and  has been authorized for detection and/or diagnosis of SARS-CoV-2 by FDA under an Emergency Use Authorization (EUA). This EUA will remain  in effect (meaning this test can be used) for the duration of the COVID-19 declaration under Section 564(b)(1) of the Act, 21 U.S.C.section 360bbb-3(b)(1), unless the authorization is terminated  or revoked sooner.       Influenza A by PCR NEGATIVE NEGATIVE Final   Influenza B by PCR NEGATIVE NEGATIVE Final    Comment: (NOTE) The Xpert Xpress SARS-CoV-2/FLU/RSV plus assay is intended as an aid in the diagnosis of influenza from Nasopharyngeal swab specimens and should not be used as a sole  basis for treatment. Nasal washings and aspirates are unacceptable for Xpert Xpress SARS-CoV-2/FLU/RSV testing.  Fact Sheet for Patients: EntrepreneurPulse.com.au  Fact Sheet for Healthcare Providers: IncredibleEmployment.be  This test is not yet approved or cleared by the [place] FDA and has been authorized for detection and/or diagnosis of SARS-CoV-2 by FDA under an Emergency Use Authorization (EUA). This EUA will remain in effect (meaning this test can be used) for the duration of the COVID-19 declaration under Section 564(b)(1) of the Act, 21 U.S.C. section 360bbb-3(b)(1), unless the authorization is terminated or revoked.     Resp Syncytial Virus by PCR POSITIVE (A) NEGATIVE Final    Comment: (NOTE) Fact Sheet for Patients: EntrepreneurPulse.com.au  Fact Sheet for Healthcare Providers: IncredibleEmployment.be  This test is not yet approved or cleared by the [place] FDA and has been authorized for detection and/or diagnosis of SARS-CoV-2 by FDA under an Emergency Use Authorization (EUA). This EUA will remain in effect (meaning this test can be used) for the duration of the COVID-19 declaration under Section 564(b)(1) of the Act, 21 U.S.C. section 360bbb-3(b)(1), unless the authorization is terminated or revoked.  Performed at [facility], [address] [place], [place] [address]    Today   Subjective    Travis Murphy today has no further hypoxia -Cough wheezing and dyspnea mostly resolved at rest -Ambulated with physical therapist no hypoxia post ambulation -Patient daughter at bedside, second daughter on the phone on speaker -Plan of care reviewed and discussed, questions answered      Patient has been seen and examined prior to discharge   Objective   Blood pressure 102/65, pulse 68, temperature 98.1 F (36.7 C), temperature source Axillary, resp. rate 18, weight 55.3 kg,  SpO2 94 %.   Intake/Output Summary (Last 24 hours) at 08/27/2022 1635 Last data filed at 08/27/2022 0500 Gross per 24 hour  Intake --  Output 300 ml  Net -300 ml    Exam Gen:- Awake Alert, no acute distress , speaking in complete sentences HEENT:- [place].AT, No sclera icterus Ears-HOH Neck-Supple Neck,No JVD,.  Lungs-  -improved air movement, no wheezing CV- S1, S2 normal, regular Abd-  +ve B.Sounds, Abd Soft, No tenderness,    Extremity/Skin:- No  edema,   good pulses Psych-affect is appropriate, oriented x3 Neuro-no new focal deficits, no tremors    Data Review   CBC w Diff:  Lab Results  Component Value Date   WBC 6.9 08/25/2022   HGB 13.2  08/25/2022   HGB 14.9 03/05/2018   HCT 39.7 08/25/2022   HCT 43.7 03/05/2018   PLT 124 (L) 08/25/2022   PLT 162 03/05/2018   LYMPHOPCT 18 08/24/2022   MONOPCT 11 08/24/2022   EOSPCT 3 08/24/2022   BASOPCT 0 08/24/2022   CMP:  Lab Results  Component Value Date   NA 139 08/27/2022   NA 143 03/05/2018   K 4.2 08/27/2022   CL 103 08/27/2022   CO2 29 08/27/2022   BUN 34 (H) 08/27/2022   BUN 12 03/05/2018   CREATININE 1.07 08/27/2022   PROT 5.9 (L) 07/11/2021   ALBUMIN 2.5 (L) 08/27/2022   BILITOT 1.7 (H) 07/11/2021   ALKPHOS 98 07/11/2021   AST 90 (H) 07/11/2021   ALT 50 (H) 07/11/2021  .  Total Discharge time is about 33 minutes  Roxan Hockey M.D on 08/27/2022 at 4:35 PM  Go to [email or web address] -  for contact info  [facility] - Office  [phone]

## 2022-08-27 NOTE — Discharge Instructions (Signed)
1)Stop Avapro/Irbesartan due to low Blood Pressure 2)follow up cardiologist to discuss possible management including anticoagulation/blood thinners for your atrial fibrillation.  You may need Zio patch/heart monitor for up to 30 days if atrial fibrillation becomes intermittent 3) Cardizem 30 mg up to every 12 hours as needed if your heart rate becomes very fast and goes above 120 beats per minute 4)Please avoid strenuous activity no driving until you talk to your cardiologist in about 2 to 3 weeks or so 5)Your serum cortisol levels are low --- please follow-up with endocrinologist Dr. Loni Beckwith, MD,-in 2 to 3 weeks for reevaluation  -Endocrinology, Diabetes & Metabolism-- [facility], [address] [place], [place] [address], Phone Number- [phone] 6) avoid excessive caffeine intake as this may make your heart rate go up 7) abstinence from tobacco advised okay to use over-the-counter nicotine patch-

## 2022-08-27 NOTE — Progress Notes (Signed)
Initial Nutrition Assessment  DOCUMENTATION CODES:   Not applicable  INTERVENTION:   -Liberalize diet to regular for wider variety of meal selections -MVI with minerals daily -Ensure Enlive po BID, each supplement provides 350 kcal and 20 grams of protein  NUTRITION DIAGNOSIS:   Increased nutrient needs related to chronic illness (COPD) as evidenced by estimated needs.  GOAL:   Patient will meet greater than or equal to 90% of their needs  MONITOR:   PO intake, Supplement acceptance  REASON FOR ASSESSMENT:   Consult Assessment of nutrition requirement/status  ASSESSMENT:   Pt with medical history significant of COPD, severe AS s/p TAVR, HTN and ongoing tobacco abuse admitted on 08/25/2022 with acute hypoxic respiratory failure in the setting of RSV infection and underlying COPD  Pt admitted with RSV bronchitis and acute COPD exacerbation.   Reviewed I/O's: -60 ml x 24 hours and +50 ml since admission  UOP: 300 ml x 24 hours   Pt unavailable at time of visit. Attempted to speak with pt via call to hospital room phone, however, unable to reach. RD unable to obtain further nutrition-related history or complete nutrition-focused physical exam at this time.    Pt currently on a heart healthy diet. Noted meal completions 25-75%.  Reviewed wt hx; pt has experienced a 3.7% wt loss over the past 3 months. Noted distant history of weight loss.   Pt at risk for malnutrition given multiple comorbidities and advanced age. He would benefit from addition of oral nutrition supplements.   Medications reviewed and include cardizem and solu-medrol.    Lab Results  Component Value Date   HGBA1C 6.1 (H) 04/17/2018   PTA DM medications are none.   Labs reviewed: CBGS: 118 (inpatient orders for glycemic control are none).    Diet Order:   Diet Order             Diet Heart Room service appropriate? Yes; Fluid consistency: Thin  Diet effective now                    EDUCATION NEEDS:   No education needs have been identified at this time  Skin:  Skin Assessment: Reviewed RN Assessment  Last BM:  08/26/22  Height:   Ht Readings from Last 1 Encounters:  05/08/22 '5\' 6"'$  (1.676 m)    Weight:   Wt Readings from Last 1 Encounters:  08/24/22 55.3 kg    Ideal Body Weight:  64.5 kg  BMI:  Body mass index is 19.69 kg/m.  Estimated Nutritional Needs:   Kcal:  1650-1850  Protein:  75-90 grams  Fluid:  > 1.6 L    Loistine Chance, RD, LDN, [place] Registered Dietitian II Certified Diabetes Care and Education Specialist Please refer to [facility] for RD and/or RD on-call/weekend/after hours pager

## 2022-08-28 DIAGNOSIS — J9601 Acute respiratory failure with hypoxia: Secondary | ICD-10-CM | POA: Diagnosis not present

## 2022-08-28 NOTE — TOC Progression Note (Signed)
Transition of Care [facility]) - Progression Note    Patient Details  Name: Travis Murphy MRN: [id number] Date of Birth: [birth year]  Transition of [facility]) CM/SW Contact  Boneta Lucks, RN Phone Number: 08/28/2022, 11:47 AM  Clinical Narrative:   Daughter called TOC, patient has not received DME or neb machine. TOC checked with [employer], DME will be delivered soon, Neb machine will be 2 day. Patient needing Neb treatment. CM took neb machine to main lobby, Helene Kelp his daughter signed and took machine. TOC updated Kristin with [employer], Ticket in the office for pick up.    Expected Discharge Plan: [place] Barriers to Discharge: Continued Medical Work up  Expected Discharge Plan and Services         Expected Discharge Date: 08/27/22               DME Arranged: Gilford Rile rolling DME Agency: [employer] Date DME Agency Contacted: 08/25/22 Time DME Agency Contacted: [id number] Representative spoke with at DME Agency: [place]: PT [place]: [place] Date [place]: 08/25/22 Time [place]: 1216 Representative spoke with at [place]: [place] ([place]) Interventions [place]: No Food Insecurity (08/25/2022)  Housing: Low Risk  (08/25/2022)  Transportation Needs: No Transportation Needs (08/25/2022)  Utilities: Not At Risk (08/25/2022)  Alcohol Screen: Low Risk  (07/27/2022)  Depression (PHQ2-9): Low Risk  (07/27/2022)  Financial Resource Strain: Low Risk  (07/27/2022)  Physical Activity: Inactive (07/27/2022)  Social Connections: Moderately Isolated (07/27/2022)  Stress: No Stress Concern Present (07/27/2022)  Tobacco Use: High Risk (08/24/2022)    Readmission Risk Interventions     No data to display

## 2022-08-28 NOTE — TOC Progression Note (Addendum)
Transition of Care [facility]) - Progression Note    Patient Details  Name: Travis Murphy MRN: [id number] Date of Birth: [birth year]  [facility]) CM/SW Contact  Salome Arnt, [place] Phone Number: 08/28/2022, 7:37 AM  Clinical Narrative:  LCSW noted this morning that nebulizer was ordered. Referred to [employer] as pt's walker was referred to [employer] during hospital stay. Cory with [facility] aware of d/c. Pt updated by voicemail.       Expected Discharge Plan: [place] Barriers to Discharge: Continued Medical Work up  Expected Discharge Plan and Services         Expected Discharge Date: 08/27/22               DME Arranged: Gilford Rile rolling DME Agency: [employer] Date DME Agency Contacted: 08/25/22 Time DME Agency Contacted: [phone] Representative spoke with at DME Agency: [place]: PT [place]: [place] Date [place]: 08/25/22 Time [place]: 1216 Representative spoke with at [place]: [place] ([place]) Interventions [place]: No Food Insecurity (08/25/2022)  Housing: Low Risk  (08/25/2022)  Transportation Needs: No Transportation Needs (08/25/2022)  Utilities: Not At Risk (08/25/2022)  Alcohol Screen: Low Risk  (07/27/2022)  Depression (PHQ2-9): Low Risk  (07/27/2022)  Financial Resource Strain: Low Risk  (07/27/2022)  Physical Activity: Inactive (07/27/2022)  Social Connections: Moderately Isolated (07/27/2022)  Stress: No Stress Concern Present (07/27/2022)  Tobacco Use: High Risk (08/24/2022)    Readmission Risk Interventions     No data to display

## 2022-08-29 ENCOUNTER — Ambulatory Visit: Payer: Self-pay | Admitting: "Endocrinology

## 2022-08-31 DIAGNOSIS — I5032 Chronic diastolic (congestive) heart failure: Secondary | ICD-10-CM | POA: Diagnosis not present

## 2022-08-31 DIAGNOSIS — Z48812 Encounter for surgical aftercare following surgery on the circulatory system: Secondary | ICD-10-CM | POA: Diagnosis not present

## 2022-08-31 DIAGNOSIS — J441 Chronic obstructive pulmonary disease with (acute) exacerbation: Secondary | ICD-10-CM | POA: Diagnosis not present

## 2022-08-31 DIAGNOSIS — J9601 Acute respiratory failure with hypoxia: Secondary | ICD-10-CM | POA: Diagnosis not present

## 2022-08-31 DIAGNOSIS — I110 Hypertensive heart disease with heart failure: Secondary | ICD-10-CM | POA: Diagnosis not present

## 2022-09-02 DIAGNOSIS — I4811 Longstanding persistent atrial fibrillation: Secondary | ICD-10-CM | POA: Diagnosis not present

## 2022-09-02 DIAGNOSIS — I10 Essential (primary) hypertension: Secondary | ICD-10-CM | POA: Diagnosis not present

## 2022-09-03 DIAGNOSIS — J441 Chronic obstructive pulmonary disease with (acute) exacerbation: Secondary | ICD-10-CM | POA: Diagnosis not present

## 2022-09-03 DIAGNOSIS — J449 Chronic obstructive pulmonary disease, unspecified: Secondary | ICD-10-CM | POA: Diagnosis not present

## 2022-09-03 DIAGNOSIS — I110 Hypertensive heart disease with heart failure: Secondary | ICD-10-CM | POA: Diagnosis not present

## 2022-09-03 DIAGNOSIS — F01A0 Vascular dementia, mild, without behavioral disturbance, psychotic disturbance, mood disturbance, and anxiety: Secondary | ICD-10-CM | POA: Diagnosis not present

## 2022-09-03 DIAGNOSIS — I5032 Chronic diastolic (congestive) heart failure: Secondary | ICD-10-CM | POA: Diagnosis not present

## 2022-09-03 DIAGNOSIS — J9601 Acute respiratory failure with hypoxia: Secondary | ICD-10-CM | POA: Diagnosis not present

## 2022-09-03 DIAGNOSIS — Z48812 Encounter for surgical aftercare following surgery on the circulatory system: Secondary | ICD-10-CM | POA: Diagnosis not present

## 2022-09-05 DIAGNOSIS — J9601 Acute respiratory failure with hypoxia: Secondary | ICD-10-CM | POA: Diagnosis not present

## 2022-09-05 DIAGNOSIS — J441 Chronic obstructive pulmonary disease with (acute) exacerbation: Secondary | ICD-10-CM | POA: Diagnosis not present

## 2022-09-05 DIAGNOSIS — I110 Hypertensive heart disease with heart failure: Secondary | ICD-10-CM | POA: Diagnosis not present

## 2022-09-05 DIAGNOSIS — I5032 Chronic diastolic (congestive) heart failure: Secondary | ICD-10-CM | POA: Diagnosis not present

## 2022-09-05 DIAGNOSIS — Z48812 Encounter for surgical aftercare following surgery on the circulatory system: Secondary | ICD-10-CM | POA: Diagnosis not present

## 2022-09-06 ENCOUNTER — Telehealth: Payer: Self-pay | Admitting: Nurse Practitioner

## 2022-09-06 ENCOUNTER — Encounter: Payer: Self-pay | Admitting: Nurse Practitioner

## 2022-09-06 ENCOUNTER — Ambulatory Visit (INDEPENDENT_AMBULATORY_CARE_PROVIDER_SITE_OTHER): Payer: Medicare Other

## 2022-09-06 ENCOUNTER — Ambulatory Visit: Payer: Medicare Other | Attending: Nurse Practitioner | Admitting: Nurse Practitioner

## 2022-09-06 VITALS — BP 110/60 | HR 74 | Ht 66.0 in | Wt 127.4 lb

## 2022-09-06 DIAGNOSIS — I2510 Atherosclerotic heart disease of native coronary artery without angina pectoris: Secondary | ICD-10-CM

## 2022-09-06 DIAGNOSIS — Z8679 Personal history of other diseases of the circulatory system: Secondary | ICD-10-CM

## 2022-09-06 DIAGNOSIS — I350 Nonrheumatic aortic (valve) stenosis: Secondary | ICD-10-CM

## 2022-09-06 DIAGNOSIS — Z952 Presence of prosthetic heart valve: Secondary | ICD-10-CM

## 2022-09-06 DIAGNOSIS — I10 Essential (primary) hypertension: Secondary | ICD-10-CM | POA: Diagnosis not present

## 2022-09-06 DIAGNOSIS — J449 Chronic obstructive pulmonary disease, unspecified: Secondary | ICD-10-CM

## 2022-09-06 DIAGNOSIS — I5032 Chronic diastolic (congestive) heart failure: Secondary | ICD-10-CM | POA: Diagnosis not present

## 2022-09-06 DIAGNOSIS — I351 Nonrheumatic aortic (valve) insufficiency: Secondary | ICD-10-CM

## 2022-09-06 DIAGNOSIS — I7121 Aneurysm of the ascending aorta, without rupture: Secondary | ICD-10-CM

## 2022-09-06 NOTE — Patient Instructions (Signed)
Medication Instructions:  Your physician recommends that you continue on your current medications as directed. Please refer to the Current Medication list given to you today.   Labwork: None  Testing/Procedures: None  Follow-Up: Keep scheduled follow up with Dr. Domenic Polite in March.   Any Other Special Instructions Will Be Listed Below (If Applicable).  You have been referred to Pulmonology. They will call you with your first appointment.    If you need a refill on your cardiac medications before your next appointment, please call your pharmacy.   ZIO XT- Long Term Monitor Instructions   Your physician has requested you wear your ZIO patch monitor 14 days.   This is a single patch monitor.  [employer] supplies one patch monitor per enrollment.  Additional stickers are not available.   Please do not apply patch if you will be having a Nuclear Stress Test, Echocardiogram, Cardiac CT, MRI, or Chest Xray during the time frame you would be wearing the monitor. The patch cannot be worn during these tests.  You cannot remove and re-apply the ZIO XT patch monitor.   Your ZIO patch monitor will be sent USPS Priority mail from [facility] directly to your home address. The monitor may also be mailed to a PO BOX if home delivery is not available.   It may take 3-5 days to receive your monitor after you have been enrolled.   Once you have received you monitor, please review enclosed instructions.  Your monitor has already been registered assigning a specific monitor serial # to you.   Applying the monitor   Shave hair from upper left chest.   Hold abrader disc by orange tab.  Rub abrader in 40 strokes over left upper chest as indicated in your monitor instructions.   Clean area with 4 enclosed alcohol pads .  Use all pads to assure are is cleaned thoroughly.  Let dry.   Apply patch as indicated in monitor instructions.  Patch will be place under collarbone on left side of chest with  arrow pointing upward.   Rub patch adhesive wings for 2 minutes.Remove white label marked "1".  Remove white label marked "2".  Rub patch adhesive wings for 2 additional minutes.   While looking in a mirror, press and release button in center of patch.  A small green light will flash 3-4 times .  This will be your only indicator the monitor has been turned on.     Do not shower for the first 24 hours.  You may shower after the first 24 hours.   Press button if you feel a symptom. You will hear a small click.  Record Date, Time and Symptom in the Patient Log Book.   When you are ready to remove patch, follow instructions on last 2 pages of Patient Log Book.  Stick patch monitor onto last page of Patient Log Book.   Place Patient Log Book in [place] box.  Use locking tab on box and tape box closed securely.  The [employer] has [employer] on it.  Please place in mailbox as soon as possible.  Your physician should have your test results approximately 7 days after the monitor has been mailed back to [facility].   Call [place] at [phone] if you have questions regarding your ZIO XT patch monitor.  Call them immediately if you see an orange light blinking on your monitor.   If your monitor falls off in less than 4 days  contact our Monitor department at [phone].  If your monitor becomes loose or falls off after 4 days call [employer] at [phone] for suggestions on securing your monitor.

## 2022-09-06 NOTE — Progress Notes (Unsigned)
Cardiology Office Note:    Date:  09/06/2022  ID:  Travis Murphy, Travis Murphy [birth year], MRN [id number]  PCP:  Asencion Noble, MD   [facility] Cardiologist:  Rozann Lesches, MD     Referring MD: Asencion Noble, MD   CC: Here for hospital follow-up  History of Present Illness:    [name] is a 88 y.o. male with a hx of the following:  Severe aortic stenosis, s/p TAVR in 2019 Mild nonobstructive CAD Ascending thoracic aortic aneurysm Hypertension Hyperlipidemia Pulmonary emphysema History of dementia HFpEF  Underwent cardiac catheterization in 2019 that showed mild nonobstructive CAD.  Had CT scan done in 2022 that showed stable aneurysm with follow-up imaging in 1 year.  Patient is a delightful 88 year old male with past medical history as mentioned above.  Last seen by Bernerd Pho, PA-C on May 08, 2022.  He was reported as doing well from a cardiac perspective.  He reported having a dry cough was unaware of any specific triggers.  Was smoking around 1 pack/day.  Prior CTA in 2022 showed emphysema and subpleural parenchymal scarring in lower lobes. Repeat echocardiogram revealed normal EF, grade 2 DD, mild MR, prosthetic aortic valve revealed to have some regurgitation, similar to prior imaging.  Was found to be overall reassuring echo.  Repeat CT scan for aneurysm revealed unchanged dilated aorta at 4.2 cm, evidence of emphysema and nonspecific enlarged lymph nodes throughout chest.  It was recommended to follow-up CT angio of chest and aorta with and without contrast in 1 year.  Presented to [facility] ED with chief complaint of shortness of breath in December 2023 with flulike symptoms for past week.  Was admitted for RSV bronchitis and acute COPD exacerbation with acute hypoxic respiratory failure.  Was treated with bronchodilators and was weaned off oxygen.  Was found to be in new onset A-fib on August 26, 2022 and went into A-fib with RVR  in setting of acute illness/acute hypoxic respiratory failure due to RSV and COPD exacerbation.  CHA2DS2-VASc score 4.  Patient and daughter decided to hold off on anticoagulation at the time.  Was started on Cardizem p.o.  and was recommended to use midodrine as needed for soft blood pressures, Avapro was discontinued for the same reason.  Was recommended to follow-up with cardiology discussed risk versus benefit of anticoagulation and further management of A-fib.  Today he presents for hospital follow-up.  He states he is doing well since leaving the hospital.  Denies any acute cardiac complaints or issues.  Denies any chest pain, palpitations, syncope, presyncope, dizziness, orthopnea, PND, swelling or significant weight changes, acute bleeding, or claudication. Breathing is improving after RSV, but does note DOE. Patient is compliant with taking his medications.  Denies any other questions or concerns today.  Past Medical History:  Diagnosis Date   Arthritis    Essential hypertension    Hyperlipemia    Hyperplastic colon polyp 2002   Dr. Arnoldo Morale   Pulmonary nodule    a. right lower lobe. 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 severe AS by echo in 2017 b. 01/2018: repeat echo showing EF of 55-60%, Grade 2 DD, and severe AS with mean gradient of 58 mm Hg   Vertigo     Past  Surgical History:  Procedure Laterality Date   CATARACT EXTRACTION Right    COLONOSCOPY  08/17/2001   Small polyp measured 3 mm removed/multiple middle scattered diverticula in the ascending colon-hyperplastic   COLONOSCOPY  08/14/2011   Procedure: COLONOSCOPY;  Surgeon: Daneil Dolin, MD;  [facility];  Service: Endoscopy;  Laterality: N/A;  10:00   EXCISIONAL HEMORRHOIDECTOMY     INTRAOPERATIVE  TRANSTHORACIC ECHOCARDIOGRAM N/A 04/21/2018   Procedure: INTRAOPERATIVE TRANSTHORACIC ECHOCARDIOGRAM;  Surgeon: Burnell Blanks, MD;  Location: [facility];  Service: Open Heart Surgery;  Laterality: N/A;   RIGHT/LEFT HEART CATH AND CORONARY ANGIOGRAPHY N/A 03/25/2018   Procedure: RIGHT/LEFT HEART CATH AND CORONARY ANGIOGRAPHY;  Surgeon: Burnell Blanks, MD;  Location: [facility];  Service: Cardiovascular;  Laterality: N/A;   TRANSCATHETER AORTIC VALVE REPLACEMENT, TRANSFEMORAL  04/21/2018   TRANSCATHETER AORTIC VALVE REPLACEMENT, TRANSFEMORAL N/A 04/21/2018   Procedure: TRANSCATHETER AORTIC VALVE REPLACEMENT, TRANSFEMORAL;  Surgeon: Burnell Blanks, MD;  Location: [facility];  Service: Open Heart Surgery;  Laterality: N/A;    Current Medications: Current Meds  Medication Sig   acetaminophen (TYLENOL) 325 MG tablet Take 2 tablets (650 mg total) by mouth every 6 (six) hours as needed for mild pain, fever or headache.   albuterol (PROVENTIL) (2.5 MG/3ML) 0.083% nebulizer solution Take 3 mLs (2.5 mg total) by nebulization every 4 (four) hours as needed for wheezing or shortness of breath.   aspirin EC 81 MG tablet Take 1 tablet (81 mg total) by mouth daily with breakfast.   atorvastatin (LIPITOR) 10 MG tablet Take 10 mg by mouth daily.   [EXPIRED] dextromethorphan-guaiFENesin (MUCINEX DM) 30-600 MG 12hr tablet Take 1 tablet by mouth 2 (two) times daily for 10 days.   diltiazem (CARDIZEM) 30 MG tablet Take 1 tablet (30 mg total) by mouth every 12 (twelve) hours as needed (palpitations and heart rate over 120 bpm).   donepezil (ARICEPT) 10 MG tablet Take 10 mg by mouth every morning.   famotidine (PEPCID) 20 MG tablet One after supper   meclizine (ANTIVERT) 25 MG tablet Take 25 mg by mouth 3 (three) times daily as needed for dizziness.   midodrine (PROAMATINE) 10 MG tablet Take 1 tablet (10 mg total) by mouth 3 (three) times daily with meals.   Nebulizers (COMPRESSOR/NEBULIZER)  MISC 1 Units by Does not apply route daily as needed.   pantoprazole (PROTONIX) 40 MG tablet TAKE 1 TABLET (40 MG TOTAL) BY MOUTH DAILY. TAKE 30-60 MIN BEFORE FIRST MEAL OF THE DAY   tamsulosin (FLOMAX) 0.4 MG CAPS capsule Take 0.8 mg by mouth daily.   vitamin B-12 (CYANOCOBALAMIN) 1000 MCG tablet Take 1,000 mcg by mouth daily.     Allergies:   Patient has no known allergies.   Social History   Socioeconomic History   Marital status: Widowed    Spouse name: Not on file   Number of children: 2   Years of education: 53   Highest education level: 12th grade  Occupational History   Occupation: retired; Print production planner  Tobacco Use   Smoking status: Every Day    Packs/day: 1.00    Years: 65.00    Total pack years: 65.00    Types: Cigarettes    Passive exposure: Current   Smokeless tobacco: Never   Tobacco comments:    Verified by Daughter - [name]    Smoking Cessation Offered.  Vaping Use   Vaping Use: Never used  Substance and Sexual Activity   Alcohol use:  Not Currently    Alcohol/week: 0.0 standard drinks of alcohol   Drug use: Never   Sexual activity: Not Currently    Partners: Female  Other Topics Concern   Not on file  Social History Narrative   Widower (married for 23yr); Remarried in 2016      Social Determinants of Health   Financial Resource Strain: Low Risk  (07/27/2022)   Overall Financial Resource Strain (CARDIA)    Difficulty of Paying Living Expenses: Not hard at all  Food Insecurity: No Food Insecurity (08/25/2022)   Hunger Vital Sign    Worried About Running Out of Food in the Last Year: Never true    Ran Out of Food in the Last Year: Never true  Transportation Needs: No Transportation Needs (08/25/2022)   PRAPARE - THydrologist(Medical): No    Lack of Transportation (Non-Medical): No  Physical Activity: Inactive (07/27/2022)   Exercise Vital Sign    Days of Exercise per Week: 0 days    Minutes of Exercise per  Session: 0 min  Stress: No Stress Concern Present (07/27/2022)   FMadras   Feeling of Stress : Not at all  Social Connections: Moderately Isolated (07/27/2022)   Social Connection and Isolation Panel [NHANES]    Frequency of Communication with Friends and Family: More than three times a week    Frequency of Social Gatherings with Friends and Family: More than three times a week    Attends Religious Services: More than 4 times per year    Active Member of [employer] Organizations: No    Attends CArchivistMeetings: Never    Marital Status: Widowed     Family History: The patient's family history includes Heart attack in his father; Liver disease in his sister; Renal Disease in his brother; Stroke in his mother.  ROS:   Review of Systems  Constitutional: Negative.   HENT: Negative.    Eyes: Negative.   Respiratory: Negative.    Cardiovascular: Negative.   Gastrointestinal: Negative.   Genitourinary: Negative.   Musculoskeletal: Negative.   Skin: Negative.   Neurological:  Positive for tremors and weakness. Negative for dizziness, tingling, sensory change, speech change, focal weakness, seizures, loss of consciousness and headaches.  Endo/Heme/Allergies: Negative.   Psychiatric/Behavioral: Negative.      Please see the history of present illness.    All other systems reviewed and are negative.  EKGs/Labs/Other Studies Reviewed:    The following studies were reviewed today:   EKG:  EKG is not ordered today.    Echocardiogram on August 14, 2022: 1. Left ventricular ejection fraction, by estimation, is 55 to 60%. The  left ventricle has normal function. The left ventricle has no regional  wall motion abnormalities. Left ventricular diastolic parameters are  consistent with Grade II diastolic  dysfunction (pseudonormalization).   2. Right ventricular systolic function is normal. The right  ventricular  size is normal.   3. Left atrial size was moderately dilated.   4. The mitral valve is degenerative. Mild mitral valve regurgitation. No  evidence of mitral stenosis.   5. The aortic valve has been repaired/replaced. Aortic valve  regurgitation is moderate visually but mild by PHT of 500 msec likely from  suboptimal alignment of the CW probe. No aortic stenosis is present. There  is a 26 mm Sapien prosthetic (TAVR) valve  present in the aortic position. Aortic regurgitation PHT measures 500  msec. Aortic valve mean gradient measures 9.5 mmHg.   Comparison(s): No significant change from prior study. Coronary CTA on April 07, 2018: IMPRESSION: 1. Trileaflet aortic valve with severely thickened and calcified leaflets and severely restricted leaflets opening. There are mild calcifications extending into the LVOT under the left coronary cusp. Annular measurements suitable for delivery of a 26 mm Edwards-SAPIEN 3 valve.   2. Sufficient coronary to annulus distance.   3. Optimum Fluoroscopic Angle for Delivery:  LAO 8 CAU 5.   4. There is mild ascending aortic aneurysm with maximum diameter 41 mm.   5. No thrombus in the left atrial appendage.  Bilateral carotid duplex on April 06, 2018: Final Interpretation:  Right Carotid: Velocities in the right ICA are consistent with a 1-39%  stenosis.   Left Carotid: Velocities in the left ICA are consistent with a 1-39%  stenosis.  Right and left heart cath on March 25, 2018: Ost RCA lesion is 40% stenosed. Prox RCA to Dist RCA lesion is 20% stenosed. 1st RPLB lesion is 20% stenosed. Prox Cx to Mid Cx lesion is 30% stenosed. 3rd Mrg lesion is 30% stenosed. Dist LAD lesion is 70% stenosed. Ost LAD to Prox LAD lesion is 30% stenosed. Dist LM to Ost LAD lesion is 20% stenosed. LV end diastolic pressure is normal.   1. Mild non-obstructive CAD 2. Severe aortic valve stenosis (peak to peak gradient 43 mmHg, mean gradient 38  mmHg, AVA 0.70cm2)   Continue planning for TAVR.   Recent Labs: 08/24/2022: B Natriuretic Peptide 294.0 08/25/2022: Hemoglobin 13.2; Platelets 124 08/27/2022: BUN 34; Creatinine, Ser 1.07; Potassium 4.2; Sodium 139  Recent Lipid Panel    Component Value Date/Time   TRIG 62 02/05/2021 1156    Physical Exam:    VS:  BP 110/60   Pulse 74   Ht '5\' 6"'$  (1.676 m)   Wt 127 lb 6.4 oz (57.8 kg)   SpO2 90%   BMI 20.56 kg/m     Wt Readings from Last 3 Encounters:  09/06/22 127 lb 6.4 oz (57.8 kg)  08/24/22 122 lb (55.3 kg)  05/08/22 126 lb 9.6 oz (57.4 kg)     POE:UMPN, frail 88 y.o. male in no acute distress HEENT: Normal NECK: No JVD; No carotid bruits CARDIAC: S1/S2, RRR, no murmurs, rubs, gallops; 2+ pulses RESPIRATORY:  Clear to auscultation without rales, wheezing or rhonchi  MUSCULOSKELETAL:  No edema; No deformity  SKIN: Thin skin, warm and dry NEUROLOGIC:  Alert and oriented x 3 PSYCHIATRIC:  Normal affect   ASSESSMENT:    1. History of atrial fibrillation   2. Coronary artery disease involving native heart without angina pectoris, unspecified vessel or lesion type   3. Severe aortic valve stenosis   4. S/P TAVR (transcatheter aortic valve replacement)   5. Nonrheumatic aortic valve insufficiency   6. Chronic heart failure with preserved ejection fraction (HCC)   7. Hypertension, unspecified type   8. Aneurysm of ascending aorta without rupture ([place])   9. Chronic obstructive pulmonary disease, unspecified COPD type ([place])    PLAN:    In order of problems listed above:  New onset- Afib Recently diagnosed and went into A-fib with RVR 08/2022 in setting of acute illness/acute hypoxic respiratory failure due to RSV and COPD exacerbation.  CHA2DS2-VASc score 4.  Patient and daughter in hospital decided to hold off on anticoagulation at the time and risks vs benefits of AC was discussed with patient and daughter were do not want to pursue  AC therapy. Have not had to take  diltiazem as patient denies any tachycardia or palpitations. SR on exam today, however will arrange a 14 day monitor to assess rhythm burden. Continue ASA as pt declines AC. Heart healthy diet and regular cardiovascular exercise as tolerated encouraged.    CAD Cardiac cath in 2019 showed mild nonobstructive CAD. Stable with no anginal symptoms. No indication for ischemic evaluation. Continue current medication regimen. Heart healthy diet and regular cardiovascular exercise encouraged.   Severe AS, s/p TAVR, AR Echo in 07/2022 showed mild to moderate AR, stable from previous study. Care precautions and ED precautions discussed. Heart healthy diet and regular cardiovascular exercise as tolerated encouraged. Plan to update TTE in 1 year or sooner if clinically indicated.   4. HFpEF Echo 07/2022 revealed normal EF, grade 2 DD. Euvolemic and well compensated on exam. Low sodium diet, fluid restriction <2L, and daily weights encouraged. Educated to contact our office for weight gain of 2 lbs overnight or 5 lbs in one week. Unable to titrate GDMT at this time d/t current BP's. Will discuss more of GDMT at next OV.    HTN BP today stable. Rx midodrine during hospitalization and Avapro stopped d/t soft BP's. Discussed to monitor BP at home at least 2 hours after medications and sitting for 5-10 minutes. Continue current medication regimen. Heart healthy diet and regular cardiovascular exercise as tolerated encouraged.   Ascending thoracic aortic aneurysm CT 07/2021 showed stable thoracic aortic aneurysm at 42 mm. Will discuss repeat CT scan at next OV.    COPD Recent hospitalization for COPD exacerbation d/t RSV infection. Breathing is improving after RSV but does note DOE. Will place referral to pulmonology. Continue current medication regimen. Heart healthy diet and regular cardiovascular exercise as tolerated encouraged.   7. Disposition: Follow-up with me or APP in 1 month or sooner if anything  changes.    Medication Adjustments/Labs and Tests Ordered: Current medicines are reviewed at length with the patient today.  Concerns regarding medicines are outlined above.  Orders Placed This Encounter  Procedures   Ambulatory referral to Pulmonology   LONG TERM MONITOR (3-14 DAYS)   No orders of the defined types were placed in this encounter.   Patient Instructions  Medication Instructions:  Your physician recommends that you continue on your current medications as directed. Please refer to the Current Medication list given to you today.   Labwork: None  Testing/Procedures: None  Follow-Up: Keep scheduled follow up with Dr. Domenic Polite in March.   Any Other Special Instructions Will Be Listed Below (If Applicable).  You have been referred to Pulmonology. They will call you with your first appointment.    If you need a refill on your cardiac medications before your next appointment, please call your pharmacy.   ZIO XT- Long Term Monitor Instructions   Your physician has requested you wear your ZIO patch monitor 14 days.   This is a single patch monitor.  [employer] supplies one patch monitor per enrollment.  Additional stickers are not available.   Please do not apply patch if you will be having a Nuclear Stress Test, Echocardiogram, Cardiac CT, MRI, or Chest Xray during the time frame you would be wearing the monitor. The patch cannot be worn during these tests.  You cannot remove and re-apply the ZIO XT patch monitor.   Your ZIO patch monitor will be sent USPS Priority mail from [facility] directly to your home address. The monitor may also be mailed to a  PO BOX if home delivery is not available.   It may take 3-5 days to receive your monitor after you have been enrolled.   Once you have received you monitor, please review enclosed instructions.  Your monitor has already been registered assigning a specific monitor serial # to you.   Applying the monitor    Shave hair from upper left chest.   Hold abrader disc by orange tab.  Rub abrader in 40 strokes over left upper chest as indicated in your monitor instructions.   Clean area with 4 enclosed alcohol pads .  Use all pads to assure are is cleaned thoroughly.  Let dry.   Apply patch as indicated in monitor instructions.  Patch will be place under collarbone on left side of chest with arrow pointing upward.   Rub patch adhesive wings for 2 minutes.Remove white label marked "1".  Remove white label marked "2".  Rub patch adhesive wings for 2 additional minutes.   While looking in a mirror, press and release button in center of patch.  A small green light will flash 3-4 times .  This will be your only indicator the monitor has been turned on.     Do not shower for the first 24 hours.  You may shower after the first 24 hours.   Press button if you feel a symptom. You will hear a small click.  Record Date, Time and Symptom in the Patient Log Book.   When you are ready to remove patch, follow instructions on last 2 pages of Patient Log Book.  Stick patch monitor onto last page of Patient Log Book.   Place Patient Log Book in [place] box.  Use locking tab on box and tape box closed securely.  The [employer] and [employer] has [employer] on it.  Please place in mailbox as soon as possible.  Your physician should have your test results approximately 7 days after the monitor has been mailed back to [facility].   Call [place] at [phone] if you have questions regarding your ZIO XT patch monitor.  Call them immediately if you see an orange light blinking on your monitor.   If your monitor falls off in less than 4 days contact our Monitor department at [phone].  If your monitor becomes loose or falls off after 4 days call [employer] at [phone] for suggestions on securing your monitor.     Signed, Finis Bud, NP  09/08/2022 8:28 PM    [place]

## 2022-09-06 NOTE — Telephone Encounter (Signed)
14 day- zio- hx a fib- EP    PERCERT

## 2022-09-09 DIAGNOSIS — I110 Hypertensive heart disease with heart failure: Secondary | ICD-10-CM | POA: Diagnosis not present

## 2022-09-09 DIAGNOSIS — J9601 Acute respiratory failure with hypoxia: Secondary | ICD-10-CM | POA: Diagnosis not present

## 2022-09-09 DIAGNOSIS — Z48812 Encounter for surgical aftercare following surgery on the circulatory system: Secondary | ICD-10-CM | POA: Diagnosis not present

## 2022-09-09 DIAGNOSIS — J441 Chronic obstructive pulmonary disease with (acute) exacerbation: Secondary | ICD-10-CM | POA: Diagnosis not present

## 2022-09-09 DIAGNOSIS — I5032 Chronic diastolic (congestive) heart failure: Secondary | ICD-10-CM | POA: Diagnosis not present

## 2022-09-12 DIAGNOSIS — Z48812 Encounter for surgical aftercare following surgery on the circulatory system: Secondary | ICD-10-CM | POA: Diagnosis not present

## 2022-09-12 DIAGNOSIS — I110 Hypertensive heart disease with heart failure: Secondary | ICD-10-CM | POA: Diagnosis not present

## 2022-09-12 DIAGNOSIS — J9601 Acute respiratory failure with hypoxia: Secondary | ICD-10-CM | POA: Diagnosis not present

## 2022-09-12 DIAGNOSIS — J441 Chronic obstructive pulmonary disease with (acute) exacerbation: Secondary | ICD-10-CM | POA: Diagnosis not present

## 2022-09-12 DIAGNOSIS — I5032 Chronic diastolic (congestive) heart failure: Secondary | ICD-10-CM | POA: Diagnosis not present

## 2022-09-16 DIAGNOSIS — I5032 Chronic diastolic (congestive) heart failure: Secondary | ICD-10-CM | POA: Diagnosis not present

## 2022-09-16 DIAGNOSIS — Z48812 Encounter for surgical aftercare following surgery on the circulatory system: Secondary | ICD-10-CM | POA: Diagnosis not present

## 2022-09-16 DIAGNOSIS — J441 Chronic obstructive pulmonary disease with (acute) exacerbation: Secondary | ICD-10-CM | POA: Diagnosis not present

## 2022-09-16 DIAGNOSIS — I110 Hypertensive heart disease with heart failure: Secondary | ICD-10-CM | POA: Diagnosis not present

## 2022-09-16 DIAGNOSIS — J9601 Acute respiratory failure with hypoxia: Secondary | ICD-10-CM | POA: Diagnosis not present

## 2022-09-19 DIAGNOSIS — J441 Chronic obstructive pulmonary disease with (acute) exacerbation: Secondary | ICD-10-CM | POA: Diagnosis not present

## 2022-09-19 DIAGNOSIS — I5032 Chronic diastolic (congestive) heart failure: Secondary | ICD-10-CM | POA: Diagnosis not present

## 2022-09-19 DIAGNOSIS — I110 Hypertensive heart disease with heart failure: Secondary | ICD-10-CM | POA: Diagnosis not present

## 2022-09-19 DIAGNOSIS — J9601 Acute respiratory failure with hypoxia: Secondary | ICD-10-CM | POA: Diagnosis not present

## 2022-09-19 DIAGNOSIS — Z48812 Encounter for surgical aftercare following surgery on the circulatory system: Secondary | ICD-10-CM | POA: Diagnosis not present

## 2022-09-23 ENCOUNTER — Ambulatory Visit: Payer: Self-pay | Admitting: "Endocrinology

## 2022-09-23 DIAGNOSIS — M79675 Pain in left toe(s): Secondary | ICD-10-CM | POA: Diagnosis not present

## 2022-09-23 DIAGNOSIS — L609 Nail disorder, unspecified: Secondary | ICD-10-CM | POA: Diagnosis not present

## 2022-09-23 DIAGNOSIS — M79674 Pain in right toe(s): Secondary | ICD-10-CM | POA: Diagnosis not present

## 2022-09-23 DIAGNOSIS — M79671 Pain in right foot: Secondary | ICD-10-CM | POA: Diagnosis not present

## 2022-09-23 DIAGNOSIS — M79672 Pain in left foot: Secondary | ICD-10-CM | POA: Diagnosis not present

## 2022-09-23 DIAGNOSIS — I739 Peripheral vascular disease, unspecified: Secondary | ICD-10-CM | POA: Diagnosis not present

## 2022-09-23 NOTE — TOC Progression Note (Signed)
Transition of Care [facility]) - Progression Note    Patient Details  Name: Landan Fedie MRN: [id number] Date of Birth: 1935/04/06  [facility]) CM/SW Contact  Boneta Lucks, RN Phone Number: 09/23/2022, 4:03 PM  Clinical Narrative:   Daughter called TOC. States that [employer] shipped them a second neb machine. They did not sent a return shipping label. Daughter explained they got a machine from hospital. TOC had text to cancel the drop ship order as soon as one was given to daughter. She is returning the un opened USPS box. CM text Kristen with [employer] to updated her that patient has returned neb machine and it is here in the office to be pick up.

## 2022-09-24 DIAGNOSIS — J9601 Acute respiratory failure with hypoxia: Secondary | ICD-10-CM | POA: Diagnosis not present

## 2022-09-24 DIAGNOSIS — J449 Chronic obstructive pulmonary disease, unspecified: Secondary | ICD-10-CM | POA: Diagnosis not present

## 2022-09-24 DIAGNOSIS — J441 Chronic obstructive pulmonary disease with (acute) exacerbation: Secondary | ICD-10-CM | POA: Diagnosis not present

## 2022-09-24 DIAGNOSIS — I5032 Chronic diastolic (congestive) heart failure: Secondary | ICD-10-CM | POA: Diagnosis not present

## 2022-09-24 DIAGNOSIS — F01A0 Vascular dementia, mild, without behavioral disturbance, psychotic disturbance, mood disturbance, and anxiety: Secondary | ICD-10-CM | POA: Diagnosis not present

## 2022-09-24 DIAGNOSIS — I110 Hypertensive heart disease with heart failure: Secondary | ICD-10-CM | POA: Diagnosis not present

## 2022-09-24 DIAGNOSIS — Z48812 Encounter for surgical aftercare following surgery on the circulatory system: Secondary | ICD-10-CM | POA: Diagnosis not present

## 2022-09-27 DIAGNOSIS — I480 Paroxysmal atrial fibrillation: Secondary | ICD-10-CM | POA: Diagnosis not present

## 2022-09-27 DIAGNOSIS — Z8679 Personal history of other diseases of the circulatory system: Secondary | ICD-10-CM | POA: Diagnosis not present

## 2022-10-20 NOTE — Progress Notes (Unsigned)
Travis Murphy, male    DOB: September 20, 1934     MRN: [id number]   Brief patient profile:  36 yowm active smoker with GOLD 2 critieria for copd 04/06/2018     Admit date: 02/05/2021 Discharge date: 02/06/2021 Recommendations for Outpatient Follow-up:  Follow up with PCP in 1-2 weeks Continue on dexamethasone as prescribed for 5 more days Use inhaler as needed for shortness of breath or wheezing Counseled on smoking cessation extensively Follow-up outpatient with pulmonology with referral sent for COPD evaluation/management   Brief/Interim Summary: Travis Murphy is a 88 y.o. male with medical history significant for chronic tobacco abuse, hypertension, dyslipidemia, and aortic aneurysm who presented to the urgent care on the day of admission due to cough and nasal congestion that has been ongoing for 1 week.  He was noted to be positive for COVID-pneumonia and was noted to have some mild hypoxemia as well.  He was sent to the ED for further evaluation and was requiring 2 L nasal cannula oxygen on account of his hypoxemia.  He had received remdesivir as well as dexamethasone with significant improvement overnight and has no further oxygen requirements noted.  He continues to smoke 1 pack/day and has been encouraged to quit.  Family members would like for him to follow-up with pulmonology in the near future for evaluation and management of COPD.  He will remain on dexamethasone as prescribed for several more days and has been given an inhaler as needed for any further shortness of breath or wheezing.  No other acute events noted during the course of this brief admission.   Discharge Diagnoses:  Active Problems:   Acute hypoxemic respiratory failure due to COVID-19 [facility])   Principal discharge diagnosis: Acute hypoxemic respiratory failure secondary to COVID-19 pneumonia.       History of Present Illness  03/06/2021  Pulmonary/ 1st office eval/ Travis Murphy / [facility] re post hosp  for covid/ resp failure  GOLD 2 criteria for copd as of 03/2018  Chief Complaint  Patient presents with   Pulmonary Consult    Referred by Travis Murphy. Pt c/o cough since June 2022. Cough is non prod with no specific trigger.   Dyspnea:  denies limiting doe  Cough: since covid non prod daytime, worse with voice use  Sleep: up 30 degrees x years  SABA use: none Covid vax:  x 2 plus likely omicron June 2022  Rec Stop ramipril and replace with ibesartan 150 one half daily to start with   Prednisone 10 mg take  4 each am x 2 days,   2 each am x 2 days,  1 each am x 2 days and stop  For cough > mucinex dm 1200 mg every 12 hours as needed  Pantoprazole (protonix) 40 mg   Take  30-60 min before first meal of the day and Pepcid (famotidine)  20 mg after supper until return to office -  The key is to stop smoking completely before smoking completely stops you! GERD diet reviewed, bed blocks rec        04/06/2021  f/u ov/[place] office/Travis Murphy re: GOLD 2 copd, post covid cough/  still smoking  Chief Complaint  Patient presents with   Follow-up    Breathing is doing well. His cough has resolved.   Dyspnea:  Not limited by breathing from desired activities   Cough: no cough and no need for cough medication  Sleeping: bed is flat / 2 pillows  SABA use: none  02:  none  Covid status: vax x 2 and infected with omicron June 2022 Rec Ok to stop pantaprazole and just take pepcid 20 mg after bfast and supper until you use them up and then don't refill them and if cough flares it would indicate you have an acid problem and should resume the original plan (pantoprazole before bfast and pepcid 20 mg after supper and check with Travis Murphy The key is to stop smoking completely before smoking completely stops you! Pulmonary follow up is as needed      10/21/2022  f/u ov/[place] office/Travis Murphy re: *** maint on ***  No chief complaint on file.   Dyspnea:  *** Cough: *** Sleeping: *** SABA use: *** 02:  *** Covid status: *** Lung cancer screening: ***   No obvious day to day or daytime variability or assoc excess/ purulent sputum or mucus plugs or hemoptysis or cp or chest tightness, subjective wheeze or overt sinus or hb symptoms.   *** without nocturnal  or early am exacerbation  of respiratory  c/o's or need for noct saba. Also denies any obvious fluctuation of symptoms with weather or environmental changes or other aggravating or alleviating factors except as outlined above   No unusual exposure hx or h/o childhood pna/ asthma or knowledge of premature birth.  Current Allergies, Complete Past Medical History, Past Surgical History, Family History, and Social History were reviewed in [employer] record.  ROS  The following are not active complaints unless bolded Hoarseness, sore throat, dysphagia, dental problems, itching, sneezing,  nasal congestion or discharge of excess mucus or purulent secretions, ear ache,   fever, chills, sweats, unintended wt loss or wt gain, classically pleuritic or exertional cp,  orthopnea pnd or arm/hand swelling  or leg swelling, presyncope, palpitations, abdominal pain, anorexia, nausea, vomiting, diarrhea  or change in bowel habits or change in bladder habits, change in stools or change in urine, dysuria, hematuria,  rash, arthralgias, visual complaints, headache, numbness, weakness or ataxia or problems with walking or coordination,  change in mood or  memory.        No outpatient medications have been marked as taking for the 10/21/22 encounter (Appointment) with Travis Rockers, Travis Murphy.                     Past Medical History:  Diagnosis Date   Arthritis    Essential hypertension    Hyperlipemia    Hyperplastic colon polyp 2002   Travis. Arnoldo Morale   Pulmonary nodule    a. right lower lobe. 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 severe AS by echo in 2017 b. 01/2018: repeat echo showing EF of 55-60%, Grade 2 DD, and severe AS with mean gradient of 58 mm Hg   Vertigo        Objective:    wts   10/21/2022       ***   03/06/21 132 lb (59.9 kg)  02/05/21 135 lb (61.2 kg)  12/26/20 135 lb (61.2 kg)    Vital signs reviewed  10/21/2022  - Note at rest 02 sats  ***% on ***   General appearance:    ***      Min bar***  Assessment

## 2022-10-21 ENCOUNTER — Encounter: Payer: Self-pay | Admitting: Internal Medicine

## 2022-10-21 ENCOUNTER — Ambulatory Visit: Payer: Medicare Other | Admitting: Internal Medicine

## 2022-10-21 VITALS — BP 126/78 | HR 82 | Ht 66.0 in | Wt 126.0 lb

## 2022-10-21 DIAGNOSIS — F17210 Nicotine dependence, cigarettes, uncomplicated: Secondary | ICD-10-CM

## 2022-10-21 DIAGNOSIS — J449 Chronic obstructive pulmonary disease, unspecified: Secondary | ICD-10-CM | POA: Diagnosis not present

## 2022-10-21 MED ORDER — ALBUTEROL SULFATE HFA 108 (90 BASE) MCG/ACT IN AERS: INHALATION_SPRAY | RESPIRATORY_TRACT | Status: DC

## 2022-10-21 MED ORDER — STIOLTO RESPIMAT 2.5-2.5 MCG/ACT IN AERS: 2.0000 | INHALATION_SPRAY | Freq: Every day | RESPIRATORY_TRACT | 11 refills | Status: DC

## 2022-10-21 MED ORDER — ALBUTEROL SULFATE (2.5 MG/3ML) 0.083% IN NEBU: 2.5000 mg | INHALATION_SOLUTION | RESPIRATORY_TRACT | 2 refills | Status: DC | PRN

## 2022-10-21 NOTE — Patient Instructions (Addendum)
Plan A = Automatic = Always=    Stiolto 2 puffs 1st thing in am and stop wixella   Work on inhaler technique:  relax and gently blow all the way out then take a nice smooth full deep breath back in, triggering the inhaler at same time you start breathing in.  Hold breath in for at least  5 seconds if you can.   Rinse and gargle with water when done.  If mouth or throat bother you at all,  try brushing teeth/gums/tongue with arm and hammer toothpaste/ make a slurry and gargle and spit out.      Plan B = Backup (to supplement plan A, not to replace it) Only use your albuterol (RED) inhaler as a rescue medication to be used if you can't catch your breath by resting or doing a relaxed purse lip breathing pattern.  - The less you use it, the better it will work when you need it. - Ok to use the inhaler up to 2 puffs  every 4 hours if you must but call for appointment if use goes up over your usual need - Don't leave home without it !!  (think of it like the  starter fluid or spare tire for your car)   Plan C = Crisis (instead of Plan B but only if Plan B stops working) - only use your albuterol nebulizer if you first try Plan B and it fails to help > ok to use the nebulizer up to every 4 hours but if start needing it regularly call for immediate appointment   Also  Ok to try albuterol 15 min before an activity (on alternating days)  that you know would usually make you short of breath and see if it makes any difference and if makes none then don't take albuterol after activity unless you can't catch your breath as this means it's the resting that helps, not the albuterol.         Please schedule a follow up office visit in 4 weeks, sooner if needed  with all medications /inhalers/ solutions in hand so we can verify exactly what you are taking. This includes all medications from all doctors and over the counters

## 2022-10-22 NOTE — Assessment & Plan Note (Signed)
Counseled re importance of smoking cessation but did not meet time criteria for separate billing     Each maintenance medication was reviewed in detail including emphasizing most importantly the difference between maintenance and prns and under what circumstances the prns are to be triggered using an action plan format where appropriate.  Total time for H and P, chart review, counseling, reviewing smi/hfa/neb  device(s) , directly observing portions of ambulatory 02 saturation study/ and generating customized AVS unique to this office visit / same day charting > 30 min post hosp f/u ov

## 2022-10-22 NOTE — Assessment & Plan Note (Addendum)
Active smoker  PFT's  04/06/18  FEV1 1.55 (66 % ) ratio 0.59  p 3 % improvement from saba p 0 prior to study with DLCO  9.37 (34%) corrects to 2.70 (62%)  for alv volume and FV curve minimal concavity   - 04/06/2021   Walked on RA x  3  lap(s) =  approx 150 each  @ nl pace, stopped due to end of study, no sob  with lowest 02 sats 92%   - 10/21/2022   Walked on RA  x  3  lap(s) =  approx 450  ft  @ mod pace, stopped due to end of study with lowest 02 sats 93% and no sob > should be able to play golf, his goal - 10/21/2022  After extensive coaching inhaler device,  effectiveness =   75% with smi > change wixella to stiolto (warned re bladder outlet obst)    Pt is Group B in terms of symptom/risk and laba/lama therefore appropriate rx at this point >>>  stiolto if tol plus approp saba

## 2022-10-23 DIAGNOSIS — I4821 Permanent atrial fibrillation: Secondary | ICD-10-CM | POA: Diagnosis not present

## 2022-10-23 DIAGNOSIS — I10 Essential (primary) hypertension: Secondary | ICD-10-CM | POA: Diagnosis not present

## 2022-10-23 DIAGNOSIS — J449 Chronic obstructive pulmonary disease, unspecified: Secondary | ICD-10-CM | POA: Diagnosis not present

## 2022-10-24 DIAGNOSIS — F01A0 Vascular dementia, mild, without behavioral disturbance, psychotic disturbance, mood disturbance, and anxiety: Secondary | ICD-10-CM | POA: Diagnosis not present

## 2022-10-24 DIAGNOSIS — J449 Chronic obstructive pulmonary disease, unspecified: Secondary | ICD-10-CM | POA: Diagnosis not present

## 2022-11-06 ENCOUNTER — Telehealth: Payer: Self-pay | Admitting: Cardiology

## 2022-11-06 NOTE — Telephone Encounter (Signed)
Daughter notified and verbalized understanding.  

## 2022-11-06 NOTE — Telephone Encounter (Signed)
Left a message for patients daughter to call office back.

## 2022-11-06 NOTE — Telephone Encounter (Signed)
Pt's daughter is requesting call back to get advise and to discuss patient's driving prior to patient's appt on the 03/15.

## 2022-11-06 NOTE — Telephone Encounter (Signed)
Spoke with daughter who ask that Dr. Domenic Polite brings up in the conversation on Friday at pt's appt. That patient limits or stops driving all together. Daughter states that pt's O2 Sat's are dropping d/t his COPD and smoking. Family has tried to get pt to stop driving and it has not worked. Please advise.

## 2022-11-08 ENCOUNTER — Encounter: Payer: Self-pay | Admitting: Cardiology

## 2022-11-08 ENCOUNTER — Ambulatory Visit: Payer: Medicare Other | Attending: Cardiology | Admitting: Cardiology

## 2022-11-08 VITALS — BP 106/80 | HR 74 | Ht 66.0 in | Wt 124.2 lb

## 2022-11-08 DIAGNOSIS — Z952 Presence of prosthetic heart valve: Secondary | ICD-10-CM

## 2022-11-08 DIAGNOSIS — Z8679 Personal history of other diseases of the circulatory system: Secondary | ICD-10-CM | POA: Diagnosis not present

## 2022-11-08 DIAGNOSIS — I2510 Atherosclerotic heart disease of native coronary artery without angina pectoris: Secondary | ICD-10-CM | POA: Diagnosis not present

## 2022-11-08 NOTE — Patient Instructions (Signed)
Medication Instructions:  Your physician recommends that you continue on your current medications as directed. Please refer to the Current Medication list given to you today.   Labwork: None today  Testing/Procedures: None today  Follow-Up: 6 months  Any Other Special Instructions Will Be Listed Below (If Applicable).  If you need a refill on your cardiac medications before your next appointment, please call your pharmacy.  

## 2022-11-08 NOTE — Progress Notes (Signed)
Cardiology Office Note  Date: 11/08/2022   ID: Travis Murphy, DOB [birth year], MRN [id number]  History of Present Illness: Travis Murphy is an 88 y.o. male last seen in January by Ms. Arlington Calix NP, I reviewed the note.  He is here today with 2 of his daughters for a follow-up visit.  I reviewed his records including prior hospitalization with RSV and hypoxic respiratory failure, also documented atrial fibrillation at that time with decision to hold off on anticoagulation.  He does not report any specific sense of palpitations, no chest pain.  He did start smoking again, we discussed smoking cessation.  He is not driving currently and I think this is reasonable.  We went over his medications.  Blood pressure has been relatively stable on midodrine.  He otherwise remains on aspirin and Lipitor.  He has not had to use any short acting diltiazem.  He did have a cardiac monitor in February for follow-up of atrial fibrillation burden.  No specific atrial fibrillation documented during the monitoring period.  Occasional PACs were noted and there were multiple brief episodes of PSVT.  We discussed decision again regarding holding off on anticoagulation at this point.  I reviewed his echocardiogram from December 2023.  Physical Exam: VS:  BP 106/80   Pulse 74   Ht 5\' 6"  (1.676 m)   Wt 124 lb 3.2 oz (56.3 kg)   SpO2 93%   BMI 20.05 kg/m , BMI Body mass index is 20.05 kg/m.  Wt Readings from Last 3 Encounters:  11/08/22 124 lb 3.2 oz (56.3 kg)  10/21/22 126 lb (57.2 kg)  09/06/22 127 lb 6.4 oz (57.8 kg)    General: Patient appears comfortable at rest. HEENT: Conjunctiva and lids normal. Neck: Supple, no elevated JVP or carotid bruits. Lungs: Decreased breath sounds throughout, some upper airway congestion, no wheezing. Cardiac: Distant regular heart sounds with 2/6 systolic murmur, no gallop. Extremities: No pitting edema.  ECG:  An ECG dated 08/24/2022 was personally  reviewed today and demonstrated:  Sinus rhythm with poor R wave progression.  Labwork: February 2023: Cholesterol 102, triglycerides 50, HDL 42, LDL 48 08/24/2022: B Natriuretic Peptide 294.0 08/25/2022: Hemoglobin 13.2; Platelets 124 08/27/2022: BUN 34; Creatinine, Ser 1.07; Potassium 4.2; Sodium 139   Other Studies Reviewed Today:  Echocardiogram 08/14/2022:  1. Left ventricular ejection fraction, by estimation, is 55 to 60%. The  left ventricle has normal function. The left ventricle has no regional  wall motion abnormalities. Left ventricular diastolic parameters are  consistent with Grade II diastolic  dysfunction (pseudonormalization).   2. Right ventricular systolic function is normal. The right ventricular  size is normal.   3. Left atrial size was moderately dilated.   4. The mitral valve is degenerative. Mild mitral valve regurgitation. No  evidence of mitral stenosis.   5. The aortic valve has been repaired/replaced. Aortic valve  regurgitation is moderate visually but mild by PHT of 500 msec likely from  suboptimal alignment of the CW probe. No aortic stenosis is present. There  is a 26 mm Sapien prosthetic (TAVR) valve  present in the aortic position. Aortic regurgitation PHT measures 500  msec. Aortic valve mean gradient measures 9.5 mmHg.   Cardiac monitor February 2024: ZIO monitor reviewed.  13 days, 21 hours analyzed.   Predominant rhythm is sinus with prolonged PR interval.  Heart rate ranged from 48 bpm up to 124 bpm and average heart rate 63 bpm. There were occasional PACs representing 1.9% total beats  with otherwise rare atrial couplets and triplets. There were rare PVCs including ventricular couplets and triplets representing less than 1% total beats. Multiple, brief episodes of PSVT were noted, the longest of which lasted approximately 11 seconds There were no sustained arrhythmias or pauses.  Assessment and Plan:  1.  History of severe calcific  degenerative aortic stenosis status post TAVR with 29 mm Edwards SAPIEN 3 THV in August 2019.  Echocardiogram in December 2023 revealed mean AV gradient 9.5 mmHg with mild to moderate aortic regurgitation.  He continues on low-dose aspirin.  2.  CAD, mild nonobstructive disease by cardiac catheterization in 2019.  No active angina.  Continue aspirin and Lipitor.  3.  Atrial fibrillation, diagnosed during acute illness in January of this year.  CHA2DS2-VASc score of 4.  Left atrium moderately dilated.  Interval cardiac monitor did not demonstrate any atrial fibrillation and he remains asymptomatic in terms of palpitations.  Decision made so far to hold off on anticoagulation.  4.  HFpEF.  LVEF 55 to 60% with moderate diastolic dysfunction by echocardiogram in December 2023.  Weight is relatively stable.  5.  Asymptomatic, ascending thoracic aortic aneurysm of 42 mm by CTA in December 2022.  Disposition:  Follow up  6 months.  Signed, Satira Sark, M.D., F.A.C.C.

## 2022-11-19 NOTE — Progress Notes (Unsigned)
Travis Murphy, male    DOB: February 04, 1935     MRN: [id number]   Brief patient profile:  66 yowm active smoker with GOLD 2 critieria for copd on 04/06/2018    Admit date: 02/05/2021 Discharge date: 02/06/2021 Recommendations for Outpatient Follow-up:  Follow up with PCP in 1-2 weeks Continue on dexamethasone as prescribed for 5 more days Use inhaler as needed for shortness of breath or wheezing Counseled on smoking cessation extensively Follow-up outpatient with pulmonology with referral sent for COPD evaluation/management   Brief/Interim Summary: Travis Murphy is a 88 y.o. male with medical history significant for chronic tobacco abuse, hypertension, dyslipidemia, and aortic aneurysm who presented to the urgent care on the day of admission due to cough and nasal congestion that has been ongoing for 1 week.  He was noted to be positive for COVID-pneumonia and was noted to have some mild hypoxemia as well.  He was sent to the ED for further evaluation and was requiring 2 L nasal cannula oxygen on account of his hypoxemia.  He had received remdesivir as well as dexamethasone with significant improvement overnight and has no further oxygen requirements noted.  He continues to smoke 1 pack/day and has been encouraged to quit.  Family members would like for him to follow-up with pulmonology in the near future for evaluation and management of COPD.  He will remain on dexamethasone as prescribed for several more days and has been given an inhaler as needed for any further shortness of breath or wheezing.  No other acute events noted during the course of this brief admission.   Discharge Diagnoses:  Active Problems:   Acute hypoxemic respiratory failure due to COVID-19 [facility])   Principal discharge diagnosis: Acute hypoxemic respiratory failure secondary to COVID-19 pneumonia.       History of Present Illness  03/06/2021  Pulmonary/ 1st Murphy eval/ Travis Murphy / Prichard Murphy re post  hosp for covid/ resp failure  GOLD 2 criteria for copd as of 03/2018  Chief Complaint  Patient presents with   Pulmonary Consult    Referred by Travis. Manuella Murphy. Pt c/o cough since June 2022. Cough is non prod with no specific trigger.   Dyspnea:  denies limiting doe  Cough: since covid non prod daytime, worse with voice use  Sleep: up 30 degrees x years  SABA use: none Covid vax:  x 2 plus likely omicron June 2022  Rec Stop ramipril and replace with ibesartan 150 one half daily to start with   Prednisone 10 mg take  4 each am x 2 days,   2 each am x 2 days,  1 each am x 2 days and stop  For cough > mucinex dm 1200 mg every 12 hours as needed  Pantoprazole (protonix) 40 mg   Take  30-60 min before first meal of the day and Pepcid (famotidine)  20 mg after supper until return to Murphy -  The key is to stop smoking completely before smoking completely stops you! GERD diet reviewed, bed blocks rec        04/06/2021  f/u ov/Travis Murphy Murphy/Travis Murphy re: GOLD 2 copd, post covid cough/  still smoking  Chief Complaint  Patient presents with   Follow-up    Breathing is doing well. His cough has resolved.   Dyspnea:  Not limited by breathing from desired activities   Cough: no cough and no need for cough medication  Sleeping: bed is flat / 2 pillows  SABA use: none  02:  none  Covid status: vax x 2 and infected with omicron June 2022 Rec Ok to stop pantaprazole and just take pepcid 20 mg after bfast and supper until you use them up and then don't refill them and if cough flares it would indicate you have an acid problem and should resume the original plan (pantoprazole before bfast and pepcid 20 mg after supper and check with Travis Murphy The key is to stop smoking completely before smoking completely stops you! Pulmonary follow up is as needed      10/21/2022  f/u ov/Travis Murphy/Travis Murphy re: GOLD 2 maint on [place] Complaint  Patient presents with   Follow-up     Follow up after RSV in  December 2023 and has had issues since  Has questions about O2 sats, nebs, wixela inhaler  1 PPD   Dyspnea:  would like to be able to play golf/  Cough:  1st thing in am rattling cough / grey color  Sleeping: flat bed 1 pillow s resp cc  SABA use: rarely saba 02: none Covid status: 2 vax / one infection  Rec Plan A = Automatic = Always=    Stiolto 2 puffs 1st thing in am and stop wixella  Work on inhaler technique: Plan B = Backup (to supplement plan A, not to replace it) Only use your albuterol (RED) inhaler as a rescue medication  Plan C = Crisis (instead of Plan B but only if Plan B stops working) - only use your albuterol nebulizer if you first try [place] to try albuterol 15 min before an activity (on alternating days)  that you know would usually make you short of breath     Please schedule a follow up Murphy visit in 4 weeks, sooner if needed  with all medications /inhalers/ solutions in hand     11/20/2022  f/u ov/Travis Murphy/Travis Murphy re: copd GOLD 2  maint on no meds and no worse despite ongoing cig use  Chief Complaint  Patient presents with   Follow-up    Stiolto has affected patients medication for urination  Dyspnea:  no change on / off stiolto but has not played golf yet which was his goal Cough: esp in am/ smoker's rattle mucoid  Sleeping: bed is flat one pillow s resp cc  SABA use: not much    No obvious day to day or daytime variability or assoc  purulent sputum or mucus plugs or hemoptysis or cp or chest tightness, subjective wheeze or overt sinus or hb symptoms.   Sleeping  without nocturnal  or early am exacerbation  of respiratory  c/o's or need for noct saba. Also denies any obvious fluctuation of symptoms with weather or environmental changes or other aggravating or alleviating factors except as outlined above   No unusual exposure hx or h/o childhood pna/ asthma or knowledge of premature birth.  Current Allergies, Complete Past Medical History,  Past Surgical History, Family History, and Social History were reviewed in [employer] record.  ROS  The following are not active complaints unless bolded Hoarseness, sore throat, dysphagia, dental problems, itching, sneezing,  nasal congestion or discharge of excess mucus or purulent secretions, ear ache,   fever, chills, sweats, unintended wt loss or wt gain, classically pleuritic or exertional cp,  orthopnea pnd or arm/hand swelling  or leg swelling, presyncope, palpitations, abdominal pain, anorexia, nausea, vomiting, diarrhea  or change in bowel habits or change in bladder habits, change in  stools or change in urine, dysuria, hematuria,  rash, arthralgias, visual complaints, headache, numbness, weakness or ataxia or problems with walking or coordination walks with cane ,  change in mood or  memory.        Current Meds  Medication Sig   acetaminophen (TYLENOL) 325 MG tablet Take 2 tablets (650 mg total) by mouth every 6 (six) hours as needed for mild pain, fever or headache.   albuterol (PROVENTIL) (2.5 MG/3ML) 0.083% nebulizer solution Take 3 mLs (2.5 mg total) by nebulization every 4 (four) hours as needed for wheezing or shortness of breath.   aspirin EC 81 MG tablet Take 1 tablet (81 mg total) by mouth daily with breakfast.   atorvastatin (LIPITOR) 10 MG tablet Take 10 mg by mouth daily.   diltiazem (CARDIZEM) 30 MG tablet Take 1 tablet (30 mg total) by mouth every 12 (twelve) hours as needed (palpitations and heart rate over 120 bpm).   donepezil (ARICEPT) 10 MG tablet Take 10 mg by mouth every morning.   famotidine (PEPCID) 20 MG tablet One after supper   meclizine (ANTIVERT) 25 MG tablet Take 25 mg by mouth 3 (three) times daily as needed for dizziness.   midodrine (PROAMATINE) 10 MG tablet Take 1 tablet (10 mg total) by mouth 3 (three) times daily with meals.   Nebulizers (COMPRESSOR/NEBULIZER) MISC 1 Units by Does not apply route daily as needed.   pantoprazole  (PROTONIX) 40 MG tablet TAKE 1 TABLET (40 MG TOTAL) BY MOUTH DAILY. TAKE 30-60 MIN BEFORE FIRST MEAL OF THE DAY   tamsulosin (FLOMAX) 0.4 MG CAPS capsule Take 0.8 mg by mouth daily.   vitamin B-12 (CYANOCOBALAMIN) 1000 MCG tablet Take 1,000 mcg by mouth daily.                      Past Medical History:  Diagnosis Date   Arthritis    Essential hypertension    Hyperlipemia    Hyperplastic colon polyp 2002   Travis. Arnoldo Morale   Pulmonary nodule    a. right lower lobe. 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 severe AS by echo in 2017 b. 01/2018: repeat echo showing EF of 55-60%, Grade 2 DD, and severe AS with mean gradient of 58 mm Hg   Vertigo        Objective:    wts   11/20/2022       125  10/21/2022       126   03/06/21 132 lb (59.9 kg)  02/05/21 135 lb (61.2 kg)  12/26/20 135 lb (61.2 kg)    Vital signs reviewed  11/20/2022  - Note at rest 02 sats  95% on RA   General appearance:    elderly wm walks with 4 pronged cane     HEENT : Oropharynx  clear     NECK :  without  apparent JVD/ palpable Nodes/TM    LUNGS: no acc muscle use,  Min barrel  contour chest wall with bilateral  slightly decreased bs s audible wheeze and  without cough on insp or exp maneuvers and min  Hyperresonant  to  percussion bilaterally    CV:  RRR  no s3 or murmur or increase in P2, and no edema   ABD:  soft and nontender with pos end  insp Hoover's  in the supine position.  No bruits or organomegaly appreciated   MS:   A bit unsteady on his feet/ ext warm without deformities Or obvious joint restrictions  calf tenderness, cyanosis or clubbing     SKIN: warm and dry without lesions    NEURO:  alert, approp, nl sensorium with  no motor or cerebellar deficits apparent.            Assessment

## 2022-11-20 ENCOUNTER — Ambulatory Visit: Payer: Medicare Other | Admitting: Internal Medicine

## 2022-11-20 ENCOUNTER — Encounter: Payer: Self-pay | Admitting: Internal Medicine

## 2022-11-20 VITALS — BP 130/72 | HR 64 | Ht 66.0 in | Wt 125.0 lb

## 2022-11-20 DIAGNOSIS — J449 Chronic obstructive pulmonary disease, unspecified: Secondary | ICD-10-CM

## 2022-11-20 DIAGNOSIS — F17210 Nicotine dependence, cigarettes, uncomplicated: Secondary | ICD-10-CM | POA: Diagnosis not present

## 2022-11-20 MED ORDER — ALBUTEROL SULFATE HFA 108 (90 BASE) MCG/ACT IN AERS: INHALATION_SPRAY | RESPIRATORY_TRACT | 11 refills | Status: DC

## 2022-11-20 NOTE — Patient Instructions (Addendum)
Only use your albuterol as a rescue medication to be used if you can't catch your breath by resting or doing a relaxed purse lip breathing pattern.  - The less you use it, the better it will work when you need it. - Ok to use up to 2 puffs  every 4 hours if you must but call for immediate appointment if use goes up over your usual need - Don't leave home without it !!  (think of it like the spare tire for your car)   If not better after your inhaler ok to  add in nebulized albuterol  up to every 4 hours as needed    Also  Ok to try albuterol 15 min before an activity (on alternating days)  that you know would usually make you short of breath and see if it makes any difference and if makes none then don't take albuterol after activity unless you can't catch your breath as this means it's the resting that helps, not the albuterol.       Please schedule a follow up visit in 3 months but call sooner if needed

## 2022-11-21 NOTE — Assessment & Plan Note (Signed)
Active smoker  PFT's  04/06/18  FEV1 1.55 (66 % ) ratio 0.59  p 3 % improvement from saba p 0 prior to study with DLCO  9.37 (34%) corrects to 2.70 (62%)  for alv volume and FV curve minimal concavity   - 04/06/2021   Walked on RA x  3  lap(s) =  approx 150 each  @ nl pace, stopped due to end of study, no sob  with lowest 02 sats 92%  - 10/21/2022   Walked on RA  x  3  lap(s) =  approx 450  ft  @ mod pace, stopped due to end of study with lowest 02 sats 93% and no sob  - 10/21/2022  After extensive coaching inhaler device,  effectiveness =   75% with smi > change wixella to stiolto (warned re bladder outlet obst)  > d/c'd due to BOO > changed to just saba 11/20/2022   Really appears more limited by geriatric decline than copd at this point so ok to just rx saba prn :  Re SABA :  I spent extra time with pt today reviewing appropriate use of albuterol for prn use on exertion with the following points: 1) saba is for relief of sob that does not improve by walking a slower pace or resting but rather if the pt does not improve after trying this first. 2) If the pt is convinced, as many are, that saba helps recover from activity faster then it's easy to tell if this is the case by re-challenging : ie stop, take the inhaler, then p 5 minutes try the exact same activity (intensity of workload) that just caused the symptoms and see if they are substantially diminished or not after saba 3) if there is an activity that reproducibly causes the symptoms, try the saba 15 min before the activity on alternate days   If in fact the saba really does help, then fine to continue to use it prn but advised may need to look closer at the maintenance regimen ( = nothing for now)  used to achieve better control of airways disease with exertion.

## 2022-11-21 NOTE — Assessment & Plan Note (Signed)
Counseled re importance of smoking cessation but did not meet time criteria for separate billing    F/u in 3 mon, sooner prn         Each maintenance medication was reviewed in detail including emphasizing most importantly the difference between maintenance and prns and under what circumstances the prns are to be triggered using an action plan format where appropriate.  Total time for H and P, chart review, counseling, reviewing hfa/neb  device(s) and generating customized AVS unique to this office visit / same day charting = 22 min

## 2022-11-29 DIAGNOSIS — F01A0 Vascular dementia, mild, without behavioral disturbance, psychotic disturbance, mood disturbance, and anxiety: Secondary | ICD-10-CM | POA: Diagnosis not present

## 2022-11-29 DIAGNOSIS — J449 Chronic obstructive pulmonary disease, unspecified: Secondary | ICD-10-CM | POA: Diagnosis not present

## 2022-12-16 DIAGNOSIS — M79675 Pain in left toe(s): Secondary | ICD-10-CM | POA: Diagnosis not present

## 2022-12-16 DIAGNOSIS — L609 Nail disorder, unspecified: Secondary | ICD-10-CM | POA: Diagnosis not present

## 2022-12-16 DIAGNOSIS — I739 Peripheral vascular disease, unspecified: Secondary | ICD-10-CM | POA: Diagnosis not present

## 2022-12-16 DIAGNOSIS — M79674 Pain in right toe(s): Secondary | ICD-10-CM | POA: Diagnosis not present

## 2022-12-16 DIAGNOSIS — M79671 Pain in right foot: Secondary | ICD-10-CM | POA: Diagnosis not present

## 2022-12-16 DIAGNOSIS — M79672 Pain in left foot: Secondary | ICD-10-CM | POA: Diagnosis not present

## 2023-01-13 DIAGNOSIS — J449 Chronic obstructive pulmonary disease, unspecified: Secondary | ICD-10-CM | POA: Diagnosis not present

## 2023-01-13 DIAGNOSIS — F01A0 Vascular dementia, mild, without behavioral disturbance, psychotic disturbance, mood disturbance, and anxiety: Secondary | ICD-10-CM | POA: Diagnosis not present

## 2023-02-11 ENCOUNTER — Ambulatory Visit (INDEPENDENT_AMBULATORY_CARE_PROVIDER_SITE_OTHER): Payer: Medicare Other

## 2023-02-11 ENCOUNTER — Ambulatory Visit
Admission: EM | Admit: 2023-02-11 | Discharge: 2023-02-11 | Disposition: A | Discharge status: Home / Self Care | Payer: Medicare Other | Attending: Nurse Practitioner | Admitting: Nurse Practitioner

## 2023-02-11 DIAGNOSIS — J069 Acute upper respiratory infection, unspecified: Secondary | ICD-10-CM

## 2023-02-11 DIAGNOSIS — R0602 Shortness of breath: Secondary | ICD-10-CM | POA: Diagnosis not present

## 2023-02-11 DIAGNOSIS — J441 Chronic obstructive pulmonary disease with (acute) exacerbation: Secondary | ICD-10-CM | POA: Diagnosis not present

## 2023-02-11 DIAGNOSIS — R059 Cough, unspecified: Secondary | ICD-10-CM | POA: Diagnosis not present

## 2023-02-11 HISTORY — DX: Acute bronchiolitis due to respiratory syncytial virus: J21.0

## 2023-02-11 MED ORDER — GUAIFENESIN 100 MG/5ML PO LIQD: 5.0000 mL | Freq: Four times a day (QID) | ORAL | 0 refills | Status: DC | PRN

## 2023-02-11 MED ORDER — PREDNISONE 20 MG PO TABS: 40.0000 mg | ORAL_TABLET | Freq: Every day | ORAL | 0 refills | Status: AC

## 2023-02-11 MED ORDER — IPRATROPIUM-ALBUTEROL 0.5-2.5 (3) MG/3ML IN SOLN
3.0000 mL | Freq: Once | RESPIRATORY_TRACT | Status: AC
  Administered 2023-02-11: 3 mL via RESPIRATORY_TRACT

## 2023-02-11 MED ORDER — METHYLPREDNISOLONE SODIUM SUCC 125 MG IJ SOLR
125.0000 mg | Freq: Once | INTRAMUSCULAR | Status: AC
  Administered 2023-02-11: 125 mg via INTRAMUSCULAR

## 2023-02-11 NOTE — Discharge Instructions (Addendum)
The chest x-ray was negative for pneumonia.  Continue the current antibiotic prescribed by Dr. Ouida Sills.  You can also continue the albuterol inhaler and nebulizer treatments that he has at home. Take medication as prescribed. Increase fluids and allow for plenty of rest. May take over-the-counter Tylenol as needed for pain, fever, general discomfort. It may be helpful to use a humidifier in his bedroom at nighttime during sleep and have him sleep elevated on pillows while cough symptoms persist. If symptoms suddenly worsen to include worsening shortness of breath, difficulty breathing, continued decrease in his O2 sats, please go to the emergency department immediately for further evaluation. Please follow-up with Dr. Elesa Massed as scheduled. Follow-up as needed.

## 2023-02-11 NOTE — ED Provider Notes (Signed)
[facility]    CSN: [id number] Arrival date & time: 02/11/23  1810      History   Chief Complaint Chief Complaint  Patient presents with   Cough    HPI Travis Murphy is a 88 y.o. male.   The history is provided by a relative (Patient's daughters).   The patient was brought in by his daughters for complaints of shortness of breath and cough.  Patient's daughter states over the past several days, they have noticed a worsening cough with nasal congestion and chest congestion.  The patient's daughter states patient is also appearing "less active" than he normally is.  Patient's daughter also reports that he is not eating as much.  Patient's daughters deny fever, chills, chest pain, abdominal pain, nausea, vomiting, or diarrhea.  Patient states that he does not feel short of breath, and he also denies difficulty breathing.  Patient is currently on palliative care after RSV earlier this year.  Patient's daughter states that they did contact the nurse, and they administered a breathing treatment at home around 3 PM.  The patient reports that he is still smoking at this time, states that he smokes 1 pack of cigarettes per day.  Patient with history of hypertension, hyperlipidemia, aortic stenosis, COPD, and A-fib.  The patient was prescribed doxycycline by his PCP 1 day ago.  Past Medical History:  Diagnosis Date   Arthritis    Essential hypertension    Hyperlipemia    Hyperplastic colon polyp 2002   Dr. Lovell Sheehan   Pulmonary nodule    a. right lower lobe. 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 severe AS by echo in 2017 b. 01/2018: repeat echo showing EF of 55-60%, Grade 2 DD, and severe AS with mean  gradient of 58 mm Hg   Vertigo     Patient Active Problem List   Diagnosis Date Noted   Low serum cortisol level/AM Cortisol Level is 2.9- 08/27/2022   New onset a-fib (HCC) 08/26/2022   RSV (respiratory syncytial virus infection) 08/26/2022   Acute respiratory failure with hypoxia (HCC) 08/25/2022   RSV (respiratory syncytial virus pneumonia) 08/24/2022   COPD GOLD 2 / still smoking 03/06/2021   Upper airway cough syndrome 03/06/2021   Acute hypoxemic respiratory failure due to COVID-19 (HCC) 02/05/2021   Pulmonary nodule    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   Hyperlipemia    Cigarette smoker    Incidental pulmonary nodule, greater than or equal to 8mm 04/06/2018   Severe aortic valve stenosis    Essential hypertension    History of colon polyps 07/15/2011    Past Surgical History:  Procedure Laterality Date   CATARACT EXTRACTION Right    COLONOSCOPY  08/17/2001   Small polyp measured 3 mm removed/multiple middle scattered diverticula in the ascending colon-hyperplastic   COLONOSCOPY  08/14/2011   Procedure: COLONOSCOPY;  Surgeon: Corbin Ade, MD;  Location: [facility];  Service: Endoscopy;  Laterality: N/A;  10:00   EXCISIONAL HEMORRHOIDECTOMY     INTRAOPERATIVE TRANSTHORACIC ECHOCARDIOGRAM N/A 04/21/2018   Procedure: INTRAOPERATIVE TRANSTHORACIC ECHOCARDIOGRAM;  Surgeon: Kathleene Hazel, MD;  Location: [facility] OR;  Service: Open Heart Surgery;  Laterality: N/A;  RIGHT/LEFT HEART CATH AND CORONARY ANGIOGRAPHY N/A 03/25/2018   Procedure: RIGHT/LEFT HEART CATH AND CORONARY ANGIOGRAPHY;  Surgeon: Kathleene Hazel, MD;  Location: [facility];  Service: Cardiovascular;  Laterality: N/A;   TRANSCATHETER AORTIC VALVE REPLACEMENT, TRANSFEMORAL  04/21/2018   TRANSCATHETER AORTIC VALVE REPLACEMENT, TRANSFEMORAL N/A 04/21/2018   Procedure: TRANSCATHETER AORTIC VALVE REPLACEMENT, TRANSFEMORAL;  Surgeon: Kathleene Hazel, MD;   Location: [facility] OR;  Service: Open Heart Surgery;  Laterality: N/A;       Home Medications    Prior to Admission medications   Medication Sig Start Date End Date Taking? Authorizing Provider  guaiFENesin (ROBITUSSIN) 100 MG/5ML liquid Take 5 mLs by mouth every 6 (six) hours as needed for cough or to loosen phlegm. 02/11/23  Yes Apollonia Amini-Warren, Sadie Haber, NP  predniSONE (DELTASONE) 20 MG tablet Take 2 tablets (40 mg total) by mouth daily with breakfast for 5 days. 02/11/23 02/16/23 Yes Roshelle Traub-Warren, Sadie Haber, NP  acetaminophen (TYLENOL) 325 MG tablet Take 2 tablets (650 mg total) by mouth every 6 (six) hours as needed for mild pain, fever or headache. 08/27/22   Shon Hale, MD  albuterol (PROAIR HFA) 108 (90 Base) MCG/ACT inhaler 2 puffs every 4 hours as needed only  if your can't catch your breath 11/20/22   Nyoka Cowden, MD  albuterol (PROVENTIL) (2.5 MG/3ML) 0.083% nebulizer solution Take 3 mLs (2.5 mg total) by nebulization every 4 (four) hours as needed for wheezing or shortness of breath. 10/21/22 10/21/23  Nyoka Cowden, MD  aspirin EC 81 MG tablet Take 1 tablet (81 mg total) by mouth daily with breakfast. 08/27/22   Mariea Clonts, Courage, MD  atorvastatin (LIPITOR) 10 MG tablet Take 10 mg by mouth daily. 11/15/15   [provider]  diltiazem (CARDIZEM) 30 MG tablet Take 1 tablet (30 mg total) by mouth every 12 (twelve) hours as needed (palpitations and heart rate over 120 bpm). 08/27/22 08/27/23  Shon Hale, MD  donepezil (ARICEPT) 10 MG tablet Take 10 mg by mouth every morning. 02/11/22   [provider]  famotidine (PEPCID) 20 MG tablet One after supper 03/06/21   Nyoka Cowden, MD  meclizine (ANTIVERT) 25 MG tablet Take 25 mg by mouth 3 (three) times daily as needed for dizziness.    [provider]  midodrine (PROAMATINE) 10 MG tablet Take 1 tablet (10 mg total) by mouth 3 (three) times daily with meals. 08/27/22   Shon Hale, MD  Nebulizers  (COMPRESSOR/NEBULIZER) MISC 1 Units by Does not apply route daily as needed. 08/27/22   Shon Hale, MD  pantoprazole (PROTONIX) 40 MG tablet TAKE 1 TABLET (40 MG TOTAL) BY MOUTH DAILY. TAKE 30-60 MIN BEFORE FIRST MEAL OF THE DAY 08/30/21   Nyoka Cowden, MD  tamsulosin (FLOMAX) 0.4 MG CAPS capsule Take 0.8 mg by mouth daily.    [provider]  vitamin B-12 (CYANOCOBALAMIN) 1000 MCG tablet Take 1,000 mcg by mouth daily.    [provider]    Family History Family History  Problem Relation Age of Onset   Stroke Mother    Heart attack Father    Liver disease Sister    Renal Disease Brother     Social History Social History   Tobacco Use   Smoking status: Every Day    Packs/day: 1.00    Years: 65.00    Additional pack years: 0.00    Total pack years: 65.00    Types: Cigarettes    Passive exposure: Current  Smokeless tobacco: Never   Tobacco comments:    Verified by Daughter - Derenda Mis    Smoking Cessation Offered.  Vaping Use   Vaping Use: Never used  Substance Use Topics   Alcohol use: Not Currently    Alcohol/week: 0.0 standard drinks of alcohol   Drug use: Never     Allergies   Patient has no known allergies.   Review of Systems Review of Systems Per HPI  Physical Exam Triage Vital Signs ED Triage Vitals  Enc Vitals Group     BP 02/11/23 1829 (!) 94/58     Pulse Rate 02/11/23 1829 64     Resp 02/11/23 1829 16     Temp 02/11/23 1829 98 F (36.7 C)     Temp Source 02/11/23 1829 Oral     SpO2 02/11/23 1829 90 %     Weight 02/11/23 1831 124 lb (56.2 kg)     Height --      Head Circumference --      Peak Flow --      Pain Score 02/11/23 1831 0     Pain Loc --      Pain Edu? --      Excl. in GC? --    No data found.  Updated Vital Signs BP (!) 94/58 (BP Location: Right Arm)   Pulse 64   Temp 98 F (36.7 C) (Oral)   Resp 16   Wt 124 lb (56.2 kg)   SpO2 90%   BMI 20.01 kg/m   Visual Acuity Right Eye Distance:    Left Eye Distance:   Bilateral Distance:    Right Eye Near:   Left Eye Near:    Bilateral Near:     Physical Exam Vitals and nursing note reviewed.  Constitutional:      General: He is not in acute distress.    Appearance: Normal appearance.  HENT:     Head: Normocephalic.  Eyes:     Extraocular Movements: Extraocular movements intact.     Pupils: Pupils are equal, round, and reactive to light.  Cardiovascular:     Rate and Rhythm: Normal rate and regular rhythm.     Pulses: Normal pulses.     Heart sounds: Normal heart sounds.  Pulmonary:     Effort: Pulmonary effort is normal.     Breath sounds: Examination of the right-upper field reveals wheezing. Examination of the right-lower field reveals decreased breath sounds. Examination of the left-lower field reveals decreased breath sounds. Decreased breath sounds and wheezing present.  Abdominal:     General: Bowel sounds are normal.     Palpations: Abdomen is soft.     Tenderness: There is no abdominal tenderness.  Musculoskeletal:     Cervical back: Normal range of motion.  Skin:    General: Skin is warm and dry.  Neurological:     General: No focal deficit present.     Mental Status: He is alert and oriented to person, place, and time.  Psychiatric:        Mood and Affect: Mood normal.        Behavior: Behavior normal.      UC Treatments / Results  Labs (all labs ordered are listed, but only abnormal results are displayed) Labs Reviewed - No data to display  EKG   Radiology DG Chest 2 View  Result Date: 02/11/2023 CLINICAL DATA:  Shortness of breath and cough EXAM: CHEST - 2 VIEW COMPARISON:  08/24/2022 FINDINGS: Cardiac shadow is  within normal limits. Changes of prior TAVR are seen. Aortic calcifications are noted. Chronic interstitial changes are again identified and stable. No focal infiltrate is seen. No acute bony abnormality noted. IMPRESSION: No active cardiopulmonary disease. Electronically Signed   By:  Alcide Clever M.D.   On: 02/11/2023 19:11    Procedures Procedures (including critical care time)  Medications Ordered in UC Medications  ipratropium-albuterol (DUONEB) 0.5-2.5 (3) MG/3ML nebulizer solution 3 mL (3 mLs Nebulization Given 02/11/23 1914)  methylPREDNISolone sodium succinate (SOLU-MEDROL) 125 mg/2 mL injection 125 mg (125 mg Intramuscular Given 02/11/23 1913)    Initial Impression / Assessment and Plan / UC Course  I have reviewed the triage vital signs and the nursing notes.  Pertinent labs & imaging results that were available during my care of the patient were reviewed by me and considered in my medical decision making (see chart for details).  The patient is well-appearing, he is in no acute distress, blood pressure is soft, O2 sat remained stable between 88 to 90%.  Patient presents for complaints of shortness of breath, brought in by his daughters.  On exam, he does have wheezing and decreased lung sounds.  Solu-Medrol 125 mg IM was administered along with a DuoNeb.  Post nebulizer treatment, patient reports some improvement of his symptoms.  Chest x-ray was negative for active cardiopulmonary disease.  Symptoms appear to be consistent with COPD exacerbation and viral upper respiratory infection.  Patient is currently taking doxycycline prescribed by his PCP.  Patient advised to continue doxycycline.  Prednisone 40 mg was prescribed to help with lung inflammation.  Supportive care recommendations were provided and discussed with the patient's daughters to include continuation of his albuterol inhaler and nebulizer treatments, over-the-counter Tylenol for pain or discomfort, and use of a humidifier.  Patient's daughter was given strict ER follow-up precautions.  It was also recommended the patient follow-up with his pulmonologist, which is scheduled.  Patient's daughter is in agreement with this plan of care and verbalized understanding.  All questions were answered.  Patient stable  for discharge.  Final Clinical Impressions(s) / UC Diagnoses   Final diagnoses:  Viral upper respiratory tract infection with cough  COPD exacerbation [facility])     Discharge Instructions      The chest x-ray was negative for pneumonia.  Continue the current antibiotic prescribed by Dr. Ouida Sills.  You can also continue the albuterol inhaler and nebulizer treatments that he has at home. Take medication as prescribed. Increase fluids and allow for plenty of rest. May take over-the-counter Tylenol as needed for pain, fever, general discomfort. It may be helpful to use a humidifier in his bedroom at nighttime during sleep and have him sleep elevated on pillows while cough symptoms persist. If symptoms suddenly worsen to include worsening shortness of breath, difficulty breathing, continued decrease in his O2 sats, please go to the emergency department immediately for further evaluation. Please follow-up with Dr. Elesa Massed as scheduled. Follow-up as needed.     ED Prescriptions     Medication Sig Dispense Auth. Provider   predniSONE (DELTASONE) 20 MG tablet Take 2 tablets (40 mg total) by mouth daily with breakfast for 5 days. 10 tablet Olegario Emberson-Warren, Sadie Haber, NP   guaiFENesin (ROBITUSSIN) 100 MG/5ML liquid Take 5 mLs by mouth every 6 (six) hours as needed for cough or to loosen phlegm. 200 mL Toivo Bordon-Warren, Sadie Haber, NP      PDMP not reviewed this encounter.   Abran Cantor, NP 02/11/23 2004

## 2023-02-11 NOTE — ED Triage Notes (Addendum)
Per daughter patient has not been feeling good for the past few days has been coughing and having generalized weakness.  States his SAT's have been the 70's and 80's. Pt recently had RSV and was just started on Doxycycline and Albuterol treatments. Pt awake and alert.  Oriented to person and place.  O2 sat 90% on RA.

## 2023-02-20 ENCOUNTER — Encounter: Payer: Self-pay | Admitting: Internal Medicine

## 2023-02-20 ENCOUNTER — Ambulatory Visit: Payer: Medicare Other | Admitting: Internal Medicine

## 2023-02-20 VITALS — BP 111/67 | HR 94 | Ht 66.0 in | Wt 123.0 lb

## 2023-02-20 DIAGNOSIS — J9611 Chronic respiratory failure with hypoxia: Secondary | ICD-10-CM

## 2023-02-20 DIAGNOSIS — F17210 Nicotine dependence, cigarettes, uncomplicated: Secondary | ICD-10-CM

## 2023-02-20 DIAGNOSIS — J449 Chronic obstructive pulmonary disease, unspecified: Secondary | ICD-10-CM | POA: Diagnosis not present

## 2023-02-20 MED ORDER — AZITHROMYCIN 250 MG PO TABS: ORAL_TABLET | ORAL | 0 refills | Status: DC

## 2023-02-20 MED ORDER — BUDESONIDE 0.25 MG/2ML IN SUSP: RESPIRATORY_TRACT | 12 refills | Status: DC

## 2023-02-20 NOTE — Patient Instructions (Addendum)
For cough / congestion > mucinex 1200 mg  twice daily as needed  Zpak   Use nebulizer albuterol 2.5 mg / budesonide 0.25 mg  1st thing in am and again in afternoon  If any difficulty with breathing or oxygen levels, ok to albuterol 2.5 mg up to  every 4 hours as needed   The key is to stop smoking completely before smoking completely stops you!   O2  2lpm at rest/ sleeping and no smoking when 02 is on  Make sure you check your oxygen saturation  AT  your highest level of activity (not after you stop)   to be sure it stays over 90% and adjust  02 flow upward to maintain this level if needed but remember to turn it back to previous settings when you stop (to conserve your supply).   Please schedule a follow up office visit in 4 weeks, call sooner if needed with all medications /inhalers/ solutions in hand so we can verify exactly what you are taking. This includes all medications from all doctors and over the counters

## 2023-02-20 NOTE — Assessment & Plan Note (Addendum)
Active smoker  PFT's  04/06/18  FEV1 1.55 (66 % ) ratio 0.59  p 3 % improvement from saba p 0 prior to study with DLCO  9.37 (34%) corrects to 2.70 (62%)  for alv volume and FV curve minimal concavity   - 04/06/2021   Walked on RA x  3  lap(s) =  approx 150 each  @ nl pace, stopped due to end of study, no sob  with lowest 02 sats 92%  - 10/21/2022   Walked on RA  x  3  lap(s) =  approx 450  ft  @ mod pace, stopped due to end of study with lowest 02 sats 93% and no sob  - 10/21/2022  After extensive coaching inhaler device,  effectiveness =   75% with smi > change wixella to stiolto (warned re bladder outlet obst)  > d/c'd due to BOO > changed to just saba 11/20/2022  - 02/20/2023 started neb alb/bud 0.35 mg bid as unable to master hfa and main c/o = cough with new hypoxemic RF  Ideally he needs to be on performist/bud but can't afford it on his insurance so try alb / bud bid and if not improving will try to get him the performist next ov.   For now rx: Zpak  Max mucinex  F/u in 4 weeks with all meds in hand using a trust but verify approach to confirm accurate Medication  Reconciliation The principal here is that until we are certain that the  patients are doing what we've asked, it makes no sense to ask them to do more.

## 2023-02-20 NOTE — Assessment & Plan Note (Signed)
4-5 min discussion re active cigarette smoking in addition to office E&M  Ask about tobacco use:   ongoint Advise quitting   life or breath discussion/ dangers of 02 and cigs revieewed  Assess willingness:  Not committed at this point/ daughter upset he won't commit  Assist in quit attempt:  Per PCP when ready Arrange follow up:   Follow up per Primary Care planned

## 2023-02-20 NOTE — Progress Notes (Signed)
Travis Murphy, male    DOB: 1935/02/03     MRN: [id number]   Brief patient profile:  84 yowm active smoker with GOLD 2 critieria for copd on 04/06/2018    Admit date: 02/05/2021 Discharge date: 02/06/2021 Recommendations for Outpatient Follow-up:  Follow up with PCP in 1-2 weeks Continue on dexamethasone as prescribed for 5 more days Use inhaler as needed for shortness of breath or wheezing Counseled on smoking cessation extensively Follow-up outpatient with pulmonology with referral sent for COPD evaluation/management   Brief/Interim Summary: Travis Murphy is a 88 y.o. male with medical history significant for chronic tobacco abuse, hypertension, dyslipidemia, and aortic aneurysm who presented to the urgent care on the day of admission due to cough and nasal congestion that has been ongoing for 1 week.  He was noted to be positive for COVID-pneumonia and was noted to have some mild hypoxemia as well.  He was sent to the ED for further evaluation and was requiring 2 L nasal cannula oxygen on account of his hypoxemia.  He had received remdesivir as well as dexamethasone with significant improvement overnight and has no further oxygen requirements noted.  He continues to smoke 1 pack/day and has been encouraged to quit.  Family members would like for him to follow-up with pulmonology in the near future for evaluation and management of COPD.  He will remain on dexamethasone as prescribed for several more days and has been given an inhaler as needed for any further shortness of breath or wheezing.  No other acute events noted during the course of this brief admission.   Discharge Diagnoses:  Active Problems:   Acute hypoxemic respiratory failure due to COVID-19 [facility])   Principal discharge diagnosis: Acute hypoxemic respiratory failure secondary to COVID-19 pneumonia.       History of Present Illness  03/06/2021  Pulmonary/ 1st office eval/ Yoselyn Mcglade / [place] [facility] re post  hosp for covid/ resp failure  GOLD 2 criteria for copd as of 03/2018  Chief Complaint  Patient presents with   Pulmonary Consult    Referred by Dr. Sherryll Burger. Pt c/o cough since June 2022. Cough is non prod with no specific trigger.   Dyspnea:  denies limiting doe  Cough: since covid non prod daytime, worse with voice use  Sleep: up 30 degrees x years  SABA use: none Covid vax:  x 2 plus likely omicron June 2022  Rec Stop ramipril and replace with ibesartan 150 one half daily to start with   Prednisone 10 mg take  4 each am x 2 days,   2 each am x 2 days,  1 each am x 2 days and stop  For cough > mucinex dm 1200 mg every 12 hours as needed  Pantoprazole (protonix) 40 mg   Take  30-60 min before first meal of the day and Pepcid (famotidine)  20 mg after supper until return to office -  The key is to stop smoking completely before smoking completely stops you! GERD diet reviewed, bed blocks rec        04/06/2021  f/u ov/ office/Angeline Trick re: GOLD 2 copd, post covid cough/  still smoking  Chief Complaint  Patient presents with   Follow-up    Breathing is doing well. His cough has resolved.   Dyspnea:  Not limited by breathing from desired activities   Cough: no cough and no need for cough medication  Sleeping: bed is flat / 2 pillows  SABA use: none  02:  none  Covid status: vax x 2 and infected with omicron June 2022 Rec Ok to stop pantaprazole and just take pepcid 20 mg after bfast and supper until you use them up and then don't refill them and if cough flares it would indicate you have an acid problem and should resume the original plan (pantoprazole before bfast and pepcid 20 mg after supper and check with Dr Ouida Sills The key is to stop smoking completely before smoking completely stops you! Pulmonary follow up is as needed      10/21/2022  f/u ov/[place] office/Haifa Hatton re: GOLD 2 maint on wixella   Chief Complaint  Patient presents with   Follow-up     Follow up after RSV in  December 2023 and has had issues since  Has questions about O2 sats, nebs, wixela inhaler  1 PPD   Dyspnea:  would like to be able to play golf/  Cough:  1st thing in am rattling cough / grey color  Sleeping: flat bed 1 pillow s resp cc  SABA use: rarely saba 02: none Covid status: 2 vax / one infection  Rec Plan A = Automatic = Always=    Stiolto 2 puffs 1st thing in am and stop wixella  Work on inhaler technique: Plan B = Backup (to supplement plan A, not to replace it) Only use your albuterol (RED) inhaler as a rescue medication  Plan C = Crisis (instead of Plan B but only if Plan B stops working) - only use your albuterol nebulizer if you first try Plan B  Also  Ok to try albuterol 15 min before an activity (on alternating days)  that you know would usually make you short of breath     Please schedule a follow up office visit in 4 weeks, sooner if needed  with all medications /inhalers/ solutions in hand     11/20/2022  f/u ov/[place] office/Harlei Lehrmann re: copd GOLD 2  maint on no meds and no worse despite ongoing cig use  Chief Complaint  Patient presents with   Follow-up    Stiolto has affected patients medication for urination  Dyspnea:  no change on / off stiolto but has not played golf yet which was his goal Cough: esp in am/ smoker's rattle mucoid  Sleeping: bed is flat one pillow s resp cc  SABA use: not much  Rec Only use your albuterol as a rescue medication  If not better after your inhaler ok to  add in nebulized albuterol  up to every 4 hours as needed  Also  Ok to try albuterol 15 min before an activity (on alternating days)  that you know would usually make you short of breath   02/20/2023  f/u ov/[place] office/Bleu Moisan re: GOLD 2 / CB  maint on nothing / new chronic resp failure  Chief Complaint  Patient presents with   Follow-up  Dyspnea:  just walking room to room [place] [employer] [place]  / restaurant uses HC parking  Cough: rattling/ not  productive Sleeping: bed is flat one pillow  SABA use: just hfa / no neb > very poor  02: none     No obvious day to day or daytime variability or assoc excess/ purulent sputum or mucus plugs or hemoptysis or cp or chest tightness, subjective wheeze or overt sinus or hb symptoms.   Sleeping  without nocturnal  or early am exacerbation  of respiratory  c/o's or need for noct saba. Also denies any obvious  fluctuation of symptoms with weather or environmental changes or other aggravating or alleviating factors except as outlined above   No unusual exposure hx or h/o childhood pna/ asthma or knowledge of premature birth.  Current Allergies, Complete Past Medical History, Past Surgical History, Family History, and Social History were reviewed in [employer] record.  ROS  The following are not active complaints unless bolded Hoarseness, sore throat, dysphagia, dental problems, itching, sneezing,  nasal congestion or discharge of excess mucus or purulent secretions, ear ache,   fever, chills, sweats, unintended wt loss or wt gain, classically pleuritic or exertional cp,  orthopnea pnd or arm/hand swelling  or leg swelling, presyncope, palpitations, abdominal pain, anorexia, nausea, vomiting, diarrhea  or change in bowel habits or change in bladder habits, change in stools or change in urine, dysuria, hematuria,  rash, arthralgias, visual complaints, headache, numbness, weakness or ataxia or problems with walking or coordination,  change in mood or  memory.        Current Meds  Medication Sig   acetaminophen (TYLENOL) 325 MG tablet Take 2 tablets (650 mg total) by mouth every 6 (six) hours as needed for mild pain, fever or headache.   albuterol (PROAIR HFA) 108 (90 Base) MCG/ACT inhaler 2 puffs every 4 hours as needed only  if your can't catch your breath   albuterol (PROVENTIL) (2.5 MG/3ML) 0.083% nebulizer solution Take 3 mLs (2.5 mg total) by nebulization every 4 (four) hours  as needed for wheezing or shortness of breath.   aspirin EC 81 MG tablet Take 1 tablet (81 mg total) by mouth daily with breakfast.   atorvastatin (LIPITOR) 10 MG tablet Take 10 mg by mouth daily.   diltiazem (CARDIZEM) 30 MG tablet Take 1 tablet (30 mg total) by mouth every 12 (twelve) hours as needed (palpitations and heart rate over 120 bpm).   donepezil (ARICEPT) 10 MG tablet Take 10 mg by mouth every morning.   famotidine (PEPCID) 20 MG tablet One after supper   guaiFENesin (ROBITUSSIN) 100 MG/5ML liquid Take 5 mLs by mouth every 6 (six) hours as needed for cough or to loosen phlegm.   meclizine (ANTIVERT) 25 MG tablet Take 25 mg by mouth 3 (three) times daily as needed for dizziness.   midodrine (PROAMATINE) 10 MG tablet Take 1 tablet (10 mg total) by mouth 3 (three) times daily with meals.   Nebulizers (COMPRESSOR/NEBULIZER) MISC 1 Units by Does not apply route daily as needed.   pantoprazole (PROTONIX) 40 MG tablet TAKE 1 TABLET (40 MG TOTAL) BY MOUTH DAILY. TAKE 30-60 MIN BEFORE FIRST MEAL OF THE DAY   tamsulosin (FLOMAX) 0.4 MG CAPS capsule Take 0.8 mg by mouth daily.   vitamin B-12 (CYANOCOBALAMIN) 1000 MCG tablet Take 1,000 mcg by mouth daily.                   Past Medical History:  Diagnosis Date   Arthritis    Essential hypertension    Hyperlipemia    Hyperplastic colon polyp 2002   Dr. Lovell Sheehan   Pulmonary nodule    a. right lower lobe. 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 severe AS by echo in 2017 b. 01/2018: repeat echo showing EF of 55-60%, Grade 2 DD, and severe AS  with mean gradient of 58 mm Hg   Vertigo        Objective:    wts   02/20/2023       123  11/20/2022       125  10/21/2022       126   03/06/21 132 lb (59.9 kg)  02/05/21 135 lb (61.2 kg)   12/26/20 135 lb (61.2 kg)    Vital signs reviewed  02/20/2023  - Note at rest 02 sats  85% on RA   General appearance:    chronically ill amb wm nad very congested sounding smoker's rattle   HEENT : Oropharynx  clear     NECK :  without  apparent JVD/ palpable Nodes/TM    LUNGS: no acc muscle use,  Mild barrel  contour chest wall with bilateral insp/exp rhonchi  and  without cough on insp or exp maneuvers  and mild  Hyperresonant  to  percussion bilaterally     CV:  RRR  no s3 or murmur or increase in P2, and no edema   ABD:  soft and non tender   MS:  Nl gait/ ext warm without deformities Or obvious joint restrictions  calf tenderness, cyanosis or clubbing     SKIN: warm and dry without lesions    NEURO:  alert, approp, nl sensorium with  no motor or cerebellar deficits apparent.        I personally reviewed images and agree with radiology impression as follows:  CXR:   02/11/23 No active cardiopulmonary disease.   Assessment

## 2023-02-20 NOTE — Assessment & Plan Note (Signed)
New dx 02/20/2023 with sats 85% at rest and 90% on 6lpm walking   Advised:  2lpm at rest  Make sure you check your oxygen saturation  AT  your highest level of activity (not after you stop)   to be sure it stays over 90% and adjust  02 flow upward to maintain this level if needed but remember to turn it back to previous settings when you stop (to conserve your supply).    Each maintenance medication was reviewed in detail including emphasizing most importantly the difference between maintenance and prns and under what circumstances the prns are to be triggered using an action plan format where appropriate.  Total time for H and P, chart review, counseling, reviewing hfa/neb/02 device(s) , directly observing portions of ambulatory 02 saturation study/ and generating customized AVS unique to this office visit / same day charting  > 30 min

## 2023-02-26 DIAGNOSIS — F17200 Nicotine dependence, unspecified, uncomplicated: Secondary | ICD-10-CM | POA: Diagnosis not present

## 2023-02-26 DIAGNOSIS — E785 Hyperlipidemia, unspecified: Secondary | ICD-10-CM | POA: Diagnosis not present

## 2023-02-26 DIAGNOSIS — I519 Heart disease, unspecified: Secondary | ICD-10-CM | POA: Diagnosis not present

## 2023-02-26 DIAGNOSIS — J449 Chronic obstructive pulmonary disease, unspecified: Secondary | ICD-10-CM | POA: Diagnosis not present

## 2023-02-28 ENCOUNTER — Telehealth: Payer: Self-pay | Admitting: Internal Medicine

## 2023-02-28 NOTE — Telephone Encounter (Signed)
Pt's home nurse is stating that his O2 levels are staying in mid 90's unless fingers are cold. Hasn't been  using oxygen since Monday is asking if O2 can be reduced.. Pt is still smoking. Even when he went outside to smoke O2 only dropped to 87

## 2023-02-28 NOTE — Telephone Encounter (Signed)
Please call home health nurse at [phone]. A message can be left on her secure voicemail

## 2023-03-05 NOTE — Telephone Encounter (Signed)
Hi O2 is set on 6 LT but he is not wearing it all the time. Can they decrease LT's as this may make him feel more comfortable?  Daughter is Precious Bard [phone]  Please call home health nurse Clydie Braun at [phone].

## 2023-03-05 NOTE — Telephone Encounter (Signed)
Called Clydie Braun and there was no answer- LMTCB.   Recent ov AVS clearly states he is only supposed to be using o2 2lpm

## 2023-03-06 NOTE — Telephone Encounter (Signed)
Called Clydie Braun, call went straight to VM. Left message for patient to call back.

## 2023-03-06 NOTE — Telephone Encounter (Signed)
Travis Murphy is returning phone call. Travis Murphy phone number is [phone].

## 2023-03-07 NOTE — Telephone Encounter (Signed)
Pt daughter returning call °

## 2023-03-07 NOTE — Telephone Encounter (Signed)
Called again - no answer. LMTCB.

## 2023-03-07 NOTE — Telephone Encounter (Signed)
Clydie Braun calling back to speak to a nurse

## 2023-03-12 ENCOUNTER — Telehealth: Payer: Self-pay | Admitting: Internal Medicine

## 2023-03-12 NOTE — Telephone Encounter (Signed)
Spoke with Travis Murphy and advised per LOV patient should be on 2L at rest WITH NO SMOKING WHILE O2 IS ON.  She voiced understanding and will relay to patient/family. Nothing further needed at this time.

## 2023-03-12 NOTE — Telephone Encounter (Signed)
Travis Murphy from [facility] checking on message for oxygen. Travis Murphy phone number is [phone].

## 2023-03-12 NOTE — Telephone Encounter (Signed)
See TE dated 03/12/23. Nothing further needed at this time.

## 2023-03-19 DIAGNOSIS — E1129 Type 2 diabetes mellitus with other diabetic kidney complication: Secondary | ICD-10-CM | POA: Diagnosis not present

## 2023-03-23 NOTE — Progress Notes (Unsigned)
Travis Murphy, male    DOB: 08-31-1934     MRN: [id number]   Brief patient profile:  76 yowm active smoker with GOLD 2 critieria for copd on 04/06/2018   Admit date: 02/05/2021 Discharge date: 02/06/2021 Recommendations for Outpatient Follow-up:  Follow up with PCP in 1-2 weeks Continue on dexamethasone as prescribed for 5 more days Use inhaler as needed for shortness of breath or wheezing Counseled on smoking cessation extensively Follow-up outpatient with pulmonology with referral sent for COPD evaluation/management   Brief/Interim Summary: Travis Murphy is a 88 y.o. male with medical history significant for chronic tobacco abuse, hypertension, dyslipidemia, and aortic aneurysm who presented to the urgent care on the day of admission due to cough and nasal congestion that has been ongoing for 1 week.  He was noted to be positive for COVID-pneumonia and was noted to have some mild hypoxemia as well.  He was sent to the ED for further evaluation and was requiring 2 L nasal cannula oxygen on account of his hypoxemia.  He had received remdesivir as well as dexamethasone with significant improvement overnight and has no further oxygen requirements noted.  He continues to smoke 1 pack/day and has been encouraged to quit.  Family members would like for him to follow-up with pulmonology in the near future for evaluation and management of COPD.  He will remain on dexamethasone as prescribed for several more days and has been given an inhaler as needed for any further shortness of breath or wheezing.  No other acute events noted during the course of this brief admission.   Discharge Diagnoses:  Active Problems:   Acute hypoxemic respiratory failure due to COVID-19 Travis Murphy)   Principal discharge diagnosis: Acute hypoxemic respiratory failure secondary to COVID-19 pneumonia.    History of Present Illness  03/06/2021  Pulmonary/ 1st Murphy eval/ Travis Murphy / Travis Murphy re post hosp for  covid/ resp failure  GOLD 2 criteria for copd as of 03/2018  Chief Complaint  Patient presents with   Pulmonary Consult    Referred by Travis Murphy. Pt c/o cough since June 2022. Cough is non prod with no specific trigger.   Dyspnea:  denies limiting doe  Cough: since covid non prod daytime, worse with voice use  Sleep: up 30 degrees x years  SABA use: none Covid vax:  x 2 plus likely omicron June 2022  Rec Stop ramipril and replace with ibesartan 150 one half daily to start with   Prednisone 10 mg take  4 each am x 2 days,   2 each am x 2 days,  1 each am x 2 days and stop  For cough > mucinex dm 1200 mg every 12 hours as needed  Pantoprazole (protonix) 40 mg   Take  30-60 min before first meal of the day and Pepcid (famotidine)  20 mg after supper until return to Murphy -  The key is to stop smoking completely before smoking completely stops you! GERD diet reviewed, bed blocks rec        04/06/2021  f/u ov/Travis Murphy/Travis Murphy re: GOLD 2 copd, post covid cough/  still smoking  Chief Complaint  Patient presents with   Follow-up    Breathing is doing well. His cough has resolved.   Dyspnea:  Not limited by breathing from desired activities   Cough: no cough and no need for cough medication  Sleeping: bed is flat / 2 pillows  SABA use: none  02: none  Covid status:  vax x 2 and infected with omicron June 2022 Rec Ok to stop pantaprazole and just take pepcid 20 mg after bfast and supper until you use them up and then don't refill them and if cough flares it would indicate you have an acid problem and should resume the original plan (pantoprazole before bfast and pepcid 20 mg after supper and check with Dr Ouida Sills The key is to stop smoking completely before smoking completely stops you! Pulmonary follow up is as needed       02/20/2023  f/u ov/Travis Murphy/Noel Rodier re: GOLD 2 / CB  maint on nothing / new chronic resp failure  Chief Complaint  Patient presents with   Follow-up   Dyspnea:  just walking room to room [facility] [employer] [place]  / restaurant uses [facility] parking  Cough: rattling/ not productive Sleeping: bed is flat one pillow  SABA use: just hfa / no neb > very poor  02: none Rec For cough / congestion > mucinex 1200 mg  twice daily as needed Zpak  Use nebulizer albuterol 2.5 mg / budesonide 0.25 mg  1st thing in am and again in afternoon If any difficulty with breathing or oxygen levels, ok to albuterol 2.5 mg up to  every 4 hours as needed  The key is to stop smoking completely before smoking completely stops you!  O2  2lpm at rest/ sleeping and no smoking when 02 is on Make sure you check your oxygen saturation  AT  your highest level of activity (not after you stop)   to be sure it stays over 90%  Please schedule a follow up Murphy visit in 4 weeks, call sooner if needed with all medications /inhalers/ solutions in hand    03/24/2023  f/u ov/Travis Murphy/Travis Murphy re: GOLD 2/CB maint on duoneb/bud  did  bring meds - actually on duoneb instead of albuterol / on hospice x 3 weeks / still smoking / good appetite and swallowing s problem  Chief Complaint  Patient presents with   COPD    Gold 2  Dyspnea:  no trouble walking to front of church  Cough: rattling non productive  Sleeping: flat bed one pillow no resp symptoms  SABA use: none 02: as needed - not using hs or much at all during the day     No obvious day to day or daytime variability or assoc excess/ purulent sputum or mucus plugs or hemoptysis or cp or chest tightness, subjective wheeze or overt sinus or hb symptoms.   Sleeping  without nocturnal  or early am exacerbation  of respiratory  c/o's or need for noct saba. Also denies any obvious fluctuation of symptoms with weather or environmental changes or other aggravating or alleviating factors except as outlined above   No unusual exposure hx or h/o childhood pna/ asthma or knowledge of premature birth.  Current Allergies,  Complete Past Medical History, Past Surgical History, Family History, and Social History were reviewed in [employer] record.  ROS  The following are not active complaints unless bolded Hoarseness, sore throat, dysphagia, dental problems, itching, sneezing,  nasal congestion or discharge of excess mucus or purulent secretions, ear ache,   fever, chills, sweats, unintended wt loss or wt gain, classically pleuritic or exertional cp,  orthopnea pnd or arm/hand swelling  or leg swelling, presyncope, palpitations, abdominal pain, anorexia, nausea, vomiting, diarrhea  or change in bowel habits or change in bladder habits, change in stools or change in urine, dysuria,  hematuria,  rash, arthralgias, visual complaints, headache, numbness, weakness or ataxia or problems with walking or coordination,  change in mood or  memory.        Current Meds  Medication Sig   acetaminophen (TYLENOL) 325 MG tablet Take 2 tablets (650 mg total) by mouth every 6 (six) hours as needed for mild pain, fever or headache.   albuterol (PROAIR HFA) 108 (90 Base) MCG/ACT inhaler 2 puffs every 4 hours as needed only  if your can't catch your breath   aspirin EC 81 MG tablet Take 1 tablet (81 mg total) by mouth daily with breakfast.   atorvastatin (LIPITOR) 10 MG tablet Take 10 mg by mouth daily.   budesonide (PULMICORT) 0.25 MG/2ML nebulizer solution One vial twice daily with albuterol   diltiazem (CARDIZEM) 30 MG tablet Take 1 tablet (30 mg total) by mouth every 12 (twelve) hours as needed (palpitations and heart rate over 120 bpm).   donepezil (ARICEPT) 10 MG tablet Take 10 mg by mouth every morning.   famotidine (PEPCID) 20 MG tablet One after supper   guaiFENesin (ROBITUSSIN) 100 MG/5ML liquid Take 5 mLs by mouth every 6 (six) hours as needed for cough or to loosen phlegm.   ipratropium-albuterol (DUONEB) 0.5-2.5 (3) MG/3ML SOLN Take 3 mLs by nebulization every 4 (four) hours as needed.   irbesartan  (AVAPRO) 150 MG tablet Take 150 mg by mouth daily.   meclizine (ANTIVERT) 25 MG tablet Take 25 mg by mouth 3 (three) times daily as needed for dizziness.   midodrine (PROAMATINE) 10 MG tablet Take 1 tablet (10 mg total) by mouth 3 (three) times daily with meals.   Nebulizers (COMPRESSOR/NEBULIZER) MISC 1 Units by Does not apply route daily as needed.   pantoprazole (PROTONIX) 40 MG tablet TAKE 1 TABLET (40 MG TOTAL) BY MOUTH DAILY. TAKE 30-60 MIN BEFORE FIRST MEAL OF THE DAY   tamsulosin (FLOMAX) 0.4 MG CAPS capsule Take 0.8 mg by mouth daily.   vitamin B-12 (CYANOCOBALAMIN) 1000 MCG tablet Take 1,000 mcg by mouth daily.              Past Medical History:  Diagnosis Date   Arthritis    Essential hypertension    Hyperlipemia    Hyperplastic colon polyp 2002   Dr. Lovell Sheehan   Pulmonary nodule    a. right lower lobe. 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 severe AS by echo in 2017 b. 01/2018: repeat echo showing EF of 55-60%, Grade 2 DD, and severe AS with mean gradient of 58 mm Hg   Vertigo        Objective:    wts   03/24/2023       114  02/20/2023       123  11/20/2022       125  10/21/2022       126   03/06/21 132 lb (59.9 kg)  02/05/21 135 lb (61.2 kg)  12/26/20 135 lb (61.2 kg)    Vital signs reviewed  03/24/2023  - Note at rest 02 sats  92% on RA   General appearance:    frail elderly wm with rattling cough    HEENT : Oropharynx  clear       NECK :  without  apparent  JVD/ palpable Nodes/TM    LUNGS: no acc muscle use,  Mild barrel  contour chest wall with bilateral  Distant bs s audible wheeze and  without cough on insp or exp maneuvers  and mild  Hyperresonant  to  percussion bilaterally     CV:  RRR  no s3 or murmur or increase in P2, and no edema   ABD:   soft and nontender with pos end  insp Hoover's  in the supine position.  Scaphoid contour abd  MS:  Nl gait/ ext warm without deformities Or obvious joint restrictions  calf tenderness, cyanosis or clubbing     SKIN: warm and dry without lesions    NEURO:  alert, approp, nl sensorium with  no motor or cerebellar deficits apparent.    Assessment

## 2023-03-24 ENCOUNTER — Ambulatory Visit: Admitting: Internal Medicine

## 2023-03-24 ENCOUNTER — Encounter: Payer: Self-pay | Admitting: Internal Medicine

## 2023-03-24 VITALS — BP 109/61 | HR 68 | Ht 66.0 in | Wt 114.0 lb

## 2023-03-24 DIAGNOSIS — F17210 Nicotine dependence, cigarettes, uncomplicated: Secondary | ICD-10-CM | POA: Diagnosis not present

## 2023-03-24 DIAGNOSIS — J449 Chronic obstructive pulmonary disease, unspecified: Secondary | ICD-10-CM | POA: Diagnosis not present

## 2023-03-24 DIAGNOSIS — J9611 Chronic respiratory failure with hypoxia: Secondary | ICD-10-CM

## 2023-03-24 NOTE — Patient Instructions (Addendum)
For cough/ congestion  >   mucinex dm up to maximum of  1200 mg every 12 hours and use the flutter valve as much as you can    Make sure you check your oxygen saturation  AT  your highest level of activity (not after you stop)   to be sure it stays over 90% and adjust  02 flow upward to maintain this level if needed but remember to turn it back to previous settings when you stop (to conserve your supply).   Please schedule a follow up visit in 6  months but call sooner if needed

## 2023-03-24 NOTE — Assessment & Plan Note (Signed)
Active smoker  PFT's  04/06/18  FEV1 1.55 (66 % ) ratio 0.59  p 3 % improvement from saba p 0 prior to study with DLCO  9.37 (34%) corrects to 2.70 (62%)  for alv volume and FV curve minimal concavity   - 04/06/2021   Walked on RA x  3  lap(s) =  approx 150 each  @ nl pace, stopped due to end of study, no sob  with lowest 02 sats 92%  - 10/21/2022   Walked on RA  x  3  lap(s) =  approx 450  ft  @ mod pace, stopped due to end of study with lowest 02 sats 93% and no sob  - 10/21/2022  After extensive coaching inhaler device,  effectiveness =   75% with smi > change wixella to stiolto (warned re bladder outlet obst)  > d/c'd due to BOO > changed to just saba 11/20/2022  - 02/20/2023 started neb alb/bud 0.25 mg bid as unable to master hfa and main c/o = cough with new hypoxemic RF - 03/24/2023 flutter training   Doe better, CB symptoms persist likely as a result of smoking   Rec  For cough/ congestion > mucinex or mucinex dm  up to maximum of  1200 mg every 12 hours and use the flutter valve as much as you can   Stop smoking and expect rapid improvement in cough  No change rx : duoneb with bud 0.25 bid as maint and extra duonebs in between prn

## 2023-03-24 NOTE — Assessment & Plan Note (Signed)
Counseled re importance of smoking cessation but did not meet time criteria for separate billing   °

## 2023-03-24 NOTE — Assessment & Plan Note (Signed)
New dx 02/20/2023 with sats 85% at rest and 90% on 6lpm walking  - 03/24/2023 RA sats 92% required 2lpm walking p 150 ft RA at mod pace   Based on today 's walking study he does not require 02 at rest or walking around house but with any higher level of exertion should be at least on 2lpm  Advised: Make sure you check your oxygen saturation  AT  your highest level of activity (not after you stop)   to be sure it stays over 90% and adjust  02 flow upward to maintain this level if needed but remember to turn it back to previous settings when you stop (to conserve your supply).   Each maintenance medication was reviewed in detail including emphasizing most importantly the difference between maintenance and prns and under what circumstances the prns are to be triggered using an action plan format where appropriate.  Total time for H and P, chart review, counseling, reviewing neb device(s) , directly observing portions of ambulatory 02 saturation study/ and generating customized AVS unique to this office visit / same day charting > 30 min for multiple  refractory respiratory  symptoms

## 2023-05-10 ENCOUNTER — Other Ambulatory Visit: Payer: Self-pay | Admitting: Internal Medicine

## 2023-05-16 ENCOUNTER — Ambulatory Visit: Payer: Medicare Other | Attending: Cardiology | Admitting: Cardiology

## 2023-05-16 ENCOUNTER — Encounter: Payer: Self-pay | Admitting: Cardiology

## 2023-05-16 VITALS — BP 122/80 | HR 97 | Ht 66.0 in | Wt 123.0 lb

## 2023-05-16 DIAGNOSIS — Z952 Presence of prosthetic heart valve: Secondary | ICD-10-CM

## 2023-05-16 DIAGNOSIS — I2510 Atherosclerotic heart disease of native coronary artery without angina pectoris: Secondary | ICD-10-CM | POA: Diagnosis not present

## 2023-05-16 DIAGNOSIS — Z8679 Personal history of other diseases of the circulatory system: Secondary | ICD-10-CM

## 2023-05-16 NOTE — Patient Instructions (Signed)
Medication Instructions:  Your physician recommends that you continue on your current medications as directed. Please refer to the Current Medication list given to you today.   Labwork: None today  Testing/Procedures: None today  Follow-Up: 6 months  Any Other Special Instructions Will Be Listed Below (If Applicable).  If you need a refill on your cardiac medications before your next appointment, please call your pharmacy.

## 2023-05-16 NOTE — Progress Notes (Signed)
Cardiology Office Note  Date: 05/16/2023   ID: Porter, Bustillos [birth year], MRN [id number]  History of Present Illness: Travis Murphy is an 88 y.o. male last seen in March.  He is here today with his daughter for a follow-up visit.  At this point he has home health hospice care once a week.  He does not describe any major change in symptoms, states that he has a reasonable appetite.  Using a nebulizer at home, although still smoking.  He reports compliance with his medications, no obvious fluid retention, his weight has been stable.  I reviewed his medications which include aspirin, Lipitor, and ProAmatine from a cardiac perspective.  He does not report any sudden onset palpitations to suggest breakthrough atrial fibrillation.  Physical Exam: VS:  BP 122/80 (BP Location: Right Arm, Patient Position: Sitting, Cuff Size: Normal)   Pulse 97   Ht 5\' 6"  (1.676 m)   Wt 123 lb (55.8 kg)   SpO2 91%   BMI 19.85 kg/m , BMI Body mass index is 19.85 kg/m.  Wt Readings from Last 3 Encounters:  05/16/23 123 lb (55.8 kg)  03/24/23 114 lb (51.7 kg)  02/20/23 123 lb (55.8 kg)    General: Patient appears comfortable at rest. HEENT: Conjunctiva and lids normal. Neck: Supple, no elevated JVP or carotid bruits. Lungs: Decreased breath sounds with prolonged expiratory phase, no wheezing. Cardiac: Regular rate and rhythm, no S3, 2/6 systolic murmur. Extremities: No pitting edema.  ECG:  An ECG dated 08/24/2022 was personally reviewed today and demonstrated:  Sinus rhythm with poor R wave progression.  Labwork: 08/24/2022: B Natriuretic Peptide 294.0 08/25/2022: Hemoglobin 13.2; Platelets 124 08/27/2022: BUN 34; Creatinine, Ser 1.07; Potassium 4.2; Sodium 139     Component Value Date/Time   TRIG 62 02/05/2021 1156  July 2024: Hemoglobin A1c 6.5%  Other Studies Reviewed Today:  Echocardiogram 08/14/2022:  1. Left ventricular ejection fraction, by estimation, is 55 to 60%.  The  left ventricle has normal function. The left ventricle has no regional  wall motion abnormalities. Left ventricular diastolic parameters are  consistent with Grade II diastolic  dysfunction (pseudonormalization).   2. Right ventricular systolic function is normal. The right ventricular  size is normal.   3. Left atrial size was moderately dilated.   4. The mitral valve is degenerative. Mild mitral valve regurgitation. No  evidence of mitral stenosis.   5. The aortic valve has been repaired/replaced. Aortic valve  regurgitation is moderate visually but mild by PHT of 500 msec likely from  suboptimal alignment of the CW probe. No aortic stenosis is present. There  is a 26 mm Sapien prosthetic (TAVR) valve  present in the aortic position. Aortic regurgitation PHT measures 500  msec. Aortic valve mean gradient measures 9.5 mmHg.    Cardiac monitor February 2024: ZIO monitor reviewed.  13 days, 21 hours analyzed.   Predominant rhythm is sinus with prolonged PR interval.  Heart rate ranged from 48 bpm up to 124 bpm and average heart rate 63 bpm. There were occasional PACs representing 1.9% total beats with otherwise rare atrial couplets and triplets. There were rare PVCs including ventricular couplets and triplets representing less than 1% total beats. Multiple, brief episodes of PSVT were noted, the longest of which lasted approximately 11 seconds There were no sustained arrhythmias or pauses.  Assessment and Plan:  1.  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  Echocardiogram in December 2023 revealed mean AV gradient 9.5 mmHg with mild to moderate aortic regurgitation.  Continue aspirin.   2.  CAD, mild nonobstructive disease by cardiac catheterization in 2019.  He does not describe any obvious angina with low-level activity.  Continue Lipitor.   3.  Atrial fibrillation, diagnosed during acute illness in  January of this year.  CHA2DS2-VASc score of 4.  Left atrium moderately dilated.  Interval cardiac monitor did not demonstrate any atrial fibrillation and he remains asymptomatic in terms of palpitations.  Decision made so far to hold off on anticoagulation.   4.  HFpEF.  LVEF 55 to 60% with moderate diastolic dysfunction by echocardiogram in December 2023.   5.  Asymptomatic, ascending thoracic aortic aneurysm of 42 mm by CTA in December 2022.  Disposition:  Follow up  6 months.  Signed, Jonelle Sidle, M.D., F.A.C.C.  [facility] at [facility]

## 2023-05-27 DIAGNOSIS — E785 Hyperlipidemia, unspecified: Secondary | ICD-10-CM | POA: Diagnosis not present

## 2023-05-27 DIAGNOSIS — J449 Chronic obstructive pulmonary disease, unspecified: Secondary | ICD-10-CM | POA: Diagnosis not present

## 2023-05-27 DIAGNOSIS — I519 Heart disease, unspecified: Secondary | ICD-10-CM | POA: Diagnosis not present

## 2023-05-27 DIAGNOSIS — F17200 Nicotine dependence, unspecified, uncomplicated: Secondary | ICD-10-CM | POA: Diagnosis not present

## 2023-06-24 ENCOUNTER — Other Ambulatory Visit: Payer: Self-pay

## 2023-06-24 ENCOUNTER — Emergency Department (HOSPITAL_COMMUNITY)

## 2023-06-24 ENCOUNTER — Emergency Department (HOSPITAL_COMMUNITY)
Admission: EM | Admit: 2023-06-24 | Discharge: 2023-06-24 | Disposition: A | Discharge status: Home / Self Care | Attending: Emergency Medicine | Admitting: Emergency Medicine

## 2023-06-24 ENCOUNTER — Encounter (HOSPITAL_COMMUNITY): Payer: Self-pay

## 2023-06-24 DIAGNOSIS — I6782 Cerebral ischemia: Secondary | ICD-10-CM | POA: Diagnosis not present

## 2023-06-24 DIAGNOSIS — J449 Chronic obstructive pulmonary disease, unspecified: Secondary | ICD-10-CM | POA: Insufficient documentation

## 2023-06-24 DIAGNOSIS — Z20822 Contact with and (suspected) exposure to covid-19: Secondary | ICD-10-CM | POA: Insufficient documentation

## 2023-06-24 DIAGNOSIS — I6523 Occlusion and stenosis of bilateral carotid arteries: Secondary | ICD-10-CM | POA: Diagnosis not present

## 2023-06-24 DIAGNOSIS — F0390 Unspecified dementia without behavioral disturbance: Secondary | ICD-10-CM | POA: Diagnosis not present

## 2023-06-24 DIAGNOSIS — Z7982 Long term (current) use of aspirin: Secondary | ICD-10-CM | POA: Insufficient documentation

## 2023-06-24 DIAGNOSIS — Z7951 Long term (current) use of inhaled steroids: Secondary | ICD-10-CM | POA: Diagnosis not present

## 2023-06-24 DIAGNOSIS — W19XXXA Unspecified fall, initial encounter: Secondary | ICD-10-CM | POA: Diagnosis not present

## 2023-06-24 DIAGNOSIS — S0990XA Unspecified injury of head, initial encounter: Secondary | ICD-10-CM | POA: Diagnosis present

## 2023-06-24 DIAGNOSIS — Z743 Need for continuous supervision: Secondary | ICD-10-CM | POA: Diagnosis not present

## 2023-06-24 DIAGNOSIS — I10 Essential (primary) hypertension: Secondary | ICD-10-CM | POA: Diagnosis not present

## 2023-06-24 DIAGNOSIS — R531 Weakness: Secondary | ICD-10-CM | POA: Diagnosis not present

## 2023-06-24 DIAGNOSIS — R0689 Other abnormalities of breathing: Secondary | ICD-10-CM | POA: Diagnosis not present

## 2023-06-24 LAB — RESP PANEL BY RT-PCR (RSV, FLU A&B, COVID)  RVPGX2
Influenza A by PCR: NEGATIVE
Influenza B by PCR: NEGATIVE
Resp Syncytial Virus by PCR: NEGATIVE
SARS Coronavirus 2 by RT PCR: NEGATIVE

## 2023-06-24 LAB — BASIC METABOLIC PANEL
Anion gap: 8 (ref 5–15)
BUN: 20 mg/dL (ref 8–23)
CO2: 28 mmol/L (ref 22–32)
Calcium: 8.2 mg/dL — ABNORMAL LOW (ref 8.9–10.3)
Chloride: 102 mmol/L (ref 98–111)
Creatinine, Ser: 1.15 mg/dL (ref 0.61–1.24)
GFR, Estimated: >60 mL/min (ref >60)
Glucose, Bld: 116 mg/dL — ABNORMAL HIGH (ref 70–99)
Potassium: 3.7 mmol/L (ref 3.5–5.1)
Sodium: 138 mmol/L (ref 135–145)

## 2023-06-24 LAB — BLOOD GAS, VENOUS
Acid-Base Excess: 5.5 mmol/L — ABNORMAL HIGH (ref 0.0–2.0)
Bicarbonate: 31.6 mmol/L — ABNORMAL HIGH (ref 20.0–28.0)
Drawn by: 4237
O2 Saturation: 36.5 %
Patient temperature: 36.9
pCO2, Ven: 51 mm[Hg] (ref 44–60)
pH, Ven: 7.4 (ref 7.25–7.43)
pO2, Ven: <31 mm[Hg] — CL (ref 32–45)

## 2023-06-24 LAB — CBC
HCT: 39.3 % (ref 39.0–52.0)
Hemoglobin: 12.8 g/dL — ABNORMAL LOW (ref 13.0–17.0)
MCH: 31.4 pg (ref 26.0–34.0)
MCHC: 32.6 g/dL (ref 30.0–36.0)
MCV: 96.6 fL (ref 80.0–100.0)
Platelets: 149 10*3/uL — ABNORMAL LOW (ref 150–400)
RBC: 4.07 MIL/uL — ABNORMAL LOW (ref 4.22–5.81)
RDW: 13.8 % (ref 11.5–15.5)
WBC: 13 10*3/uL — ABNORMAL HIGH (ref 4.0–10.5)
nRBC: 0 % (ref 0.0–0.2)

## 2023-06-24 NOTE — Discharge Instructions (Signed)
You were seen for your fall in the emergency department.   At home, please use caution when walking.    Check your MyChart online for the results of any tests that had not resulted by the time you left the emergency department.   Follow-up with your primary doctor in 2-3 days regarding your visit.    Return immediately to the emergency department if you experience any of the following: additional falls, or any other concerning symptoms.    Thank you for visiting our Emergency Department. It was a pleasure taking care of you today.

## 2023-06-24 NOTE — ED Triage Notes (Addendum)
Pt is a hospice pt for end stage COPD and lives at home alone.  Pt felt weak today and sat down on the floor and then could not get himself up.  Pt reports he wants to return home even though his daughter does not want him too.

## 2023-06-24 NOTE — ED Notes (Signed)
Pt still unable to give a urine specimen at this time and does not want another drink.

## 2023-06-24 NOTE — ED Notes (Signed)
Pt given water to help obtain a urine sample.

## 2023-06-24 NOTE — ED Provider Notes (Signed)
[facility] AT [facility] Provider Note   CSN: [id number] Arrival date & time: 06/24/23  1629     History {Add pertinent medical, surgical, social history, OB history to HPI:1} Chief Complaint  Patient presents with   Travis Murphy is a 88 y.o. male.  88 year old male with a history of dementia, COPD on 2 L oxygen, aortic stenosis, hypotension on midodrine, and hyperlipidemia who presents emergency department after an unwitnessed fall.  Patient is on hospice and lives at home alone.  Had an unwitnessed fall and was found leaning against the wall.  Patient thinks that he just slid down the wall and did not have any head strike but family is concerned that he may have some other sort of injury.  They talk to his hospice nurse care and [phone]) who recommended he come into the emergency department because she was concerned that something else may have caused his fall.  Patient denies any pain to me at this time.  According to his daughter who is with him he has not been sick at all recently.  Not on any blood thinners aside from aspirin.       Home Medications Prior to Admission medications   Medication Sig Start Date End Date Taking? Authorizing Provider  acetaminophen (TYLENOL) 325 MG tablet Take 2 tablets (650 mg total) by mouth every 6 (six) hours as needed for mild pain, fever or headache. 08/27/22   Shon Hale, MD  albuterol (PROAIR HFA) 108 (90 Base) MCG/ACT inhaler 2 puffs every 4 hours as needed only  if your can't catch your breath 11/20/22   Nyoka Cowden, MD  albuterol (PROVENTIL) (2.5 MG/3ML) 0.083% nebulizer solution TAKE 3 ML (2.5 MG TOTAL) BY NEBULIZATION EVERY 4 HOURS AS NEEDED FOR WHEEZING OR SHORTNESS OF BREATH 05/12/23   Nyoka Cowden, MD  aspirin EC 81 MG tablet Take 1 tablet (81 mg total) by mouth daily with breakfast. 08/27/22   Shon Hale, MD  atorvastatin (LIPITOR) 10 MG tablet Take 10 mg by mouth  daily. 11/15/15   [provider]  budesonide (PULMICORT) 0.25 MG/2ML nebulizer solution One vial twice daily with albuterol 02/20/23   Nyoka Cowden, MD  donepezil (ARICEPT) 10 MG tablet Take 10 mg by mouth every morning. 02/11/22   [provider]  famotidine (PEPCID) 20 MG tablet One after supper 03/06/21   Nyoka Cowden, MD  ipratropium-albuterol (DUONEB) 0.5-2.5 (3) MG/3ML SOLN Take 3 mLs by nebulization every 4 (four) hours as needed. 03/18/23   [provider]  meclizine (ANTIVERT) 25 MG tablet Take 25 mg by mouth 3 (three) times daily as needed for dizziness.    [provider]  midodrine (PROAMATINE) 10 MG tablet Take 1 tablet (10 mg total) by mouth 3 (three) times daily with meals. 08/27/22   Shon Hale, MD  Nebulizers (COMPRESSOR/NEBULIZER) MISC 1 Units by Does not apply route daily as needed. 08/27/22   Shon Hale, MD  pantoprazole (PROTONIX) 40 MG tablet TAKE 1 TABLET (40 MG TOTAL) BY MOUTH DAILY. TAKE 30-60 MIN BEFORE FIRST MEAL OF THE DAY 08/30/21   Nyoka Cowden, MD  tamsulosin (FLOMAX) 0.4 MG CAPS capsule Take 0.8 mg by mouth daily.    [provider]  vitamin B-12 (CYANOCOBALAMIN) 1000 MCG tablet Take 1,000 mcg by mouth daily.    [provider]      Allergies    Patient has no known allergies.  Review of Systems   Review of Systems  Physical Exam Updated Vital Signs BP (!) 97/56   Pulse 83   Temp 98.5 F (36.9 C) (Oral)   Resp 18   Ht 5\' 6"  (1.676 m)   Wt 54.4 kg   SpO2 93%   BMI 19.37 kg/m  Physical Exam Vitals and nursing note reviewed.  Constitutional:      General: He is not in acute distress.    Appearance: Normal appearance. He is well-developed. He is not ill-appearing.     Comments: On 2 L nasal cannula  HENT:     Head: Normocephalic and atraumatic.     Right Ear: External ear normal.     Left Ear: External ear normal.     Nose: Nose normal.     Mouth/Throat:     Mouth: Mucous  membranes are moist.     Pharynx: Oropharynx is clear.  Eyes:     Extraocular Movements: Extraocular movements intact.     Conjunctiva/sclera: Conjunctivae normal.     Pupils: Pupils are equal, round, and reactive to light.  Neck:     Comments: No C-spine midline tenderness to palpation Cardiovascular:     Rate and Rhythm: Normal rate and regular rhythm.     Pulses: Normal pulses.     Heart sounds: Normal heart sounds.  Pulmonary:     Effort: Pulmonary effort is normal. No respiratory distress.     Breath sounds: Normal breath sounds.  Abdominal:     General: Abdomen is flat. Bowel sounds are normal. There is no distension.     Palpations: Abdomen is soft. There is no mass.     Tenderness: There is no abdominal tenderness. There is no guarding.  Musculoskeletal:        General: No deformity. Normal range of motion.     Cervical back: Normal range of motion and neck supple. No rigidity or tenderness.     Right lower leg: No edema.     Left lower leg: No edema.     Comments: No tenderness to palpation of midline thoracic or lumbar spine.  No step-offs palpated.  No tenderness to palpation of chest wall.  No bruising noted.  No tenderness to palpation of bilateral clavicles.  No tenderness to palpation, bruising, or deformities noted of bilateral shoulders, elbows, wrists, hips, knees, or ankles.  Skin:    General: Skin is warm and dry.  Neurological:     General: No focal deficit present.     Mental Status: He is alert. Mental status is at baseline.     Cranial Nerves: No cranial nerve deficit.     Sensory: No sensory deficit.     Motor: No weakness.  Psychiatric:        Mood and Affect: Mood normal.        Behavior: Behavior normal.     ED Results / Procedures / Treatments   Labs (all labs ordered are listed, but only abnormal results are displayed) Labs Reviewed  RESP PANEL BY RT-PCR (RSV, FLU A&B, COVID)  RVPGX2  BLOOD GAS, VENOUS  CBC  BASIC METABOLIC PANEL   URINALYSIS, ROUTINE W REFLEX MICROSCOPIC    EKG None  Radiology No results found.  Procedures Procedures  {Document cardiac monitor, telemetry assessment procedure when appropriate:1}  Medications Ordered in ED Medications - No data to display  ED Course/ Medical Decision Making/ A&P   {   Click here for ABCD2, HEART and other calculatorsREFRESH Note before signing :  1}                              Medical Decision Making Amount and/or Complexity of Data Reviewed Labs: ordered. Radiology: ordered.   ***  {Document critical care time when appropriate:1} {Document review of labs and clinical decision tools ie heart score, Chads2Vasc2 etc:1}  {Document your independent review of radiology images, and any outside records:1} {Document your discussion with family members, caretakers, and with consultants:1} {Document social determinants of health affecting pt's care:1} {Document your decision making why or why not admission, treatments were needed:1} Final Clinical Impression(s) / ED Diagnoses Final diagnoses:  None    Rx / DC Orders ED Discharge Orders     None

## 2023-06-24 NOTE — ED Notes (Signed)
Pt aware we need a urine sample.  Pt requesting a drink to help.

## 2023-06-24 NOTE — ED Notes (Signed)
Pt able to ambulate with assistance since he usually uses a cane and feels like he is good to go home.

## 2023-07-03 ENCOUNTER — Emergency Department (HOSPITAL_COMMUNITY)

## 2023-07-03 ENCOUNTER — Other Ambulatory Visit: Payer: Self-pay

## 2023-07-03 ENCOUNTER — Inpatient Hospital Stay (HOSPITAL_COMMUNITY)
Admission: EM | Admit: 2023-07-03 | Discharge: 2023-07-27 | DRG: 480 | Disposition: E | Attending: Family Medicine | Admitting: Family Medicine

## 2023-07-03 ENCOUNTER — Emergency Department (HOSPITAL_COMMUNITY): Payer: Medicare Other

## 2023-07-03 ENCOUNTER — Encounter (HOSPITAL_COMMUNITY): Payer: Self-pay | Admitting: Radiology

## 2023-07-03 DIAGNOSIS — Z953 Presence of xenogenic heart valve: Secondary | ICD-10-CM

## 2023-07-03 DIAGNOSIS — I700 Atherosclerosis of aorta: Secondary | ICD-10-CM | POA: Diagnosis present

## 2023-07-03 DIAGNOSIS — Z860100 Personal history of colon polyps, unspecified: Secondary | ICD-10-CM

## 2023-07-03 DIAGNOSIS — Z681 Body mass index (BMI) 19 or less, adult: Secondary | ICD-10-CM

## 2023-07-03 DIAGNOSIS — R9389 Abnormal findings on diagnostic imaging of other specified body structures: Secondary | ICD-10-CM | POA: Diagnosis not present

## 2023-07-03 DIAGNOSIS — F0390 Unspecified dementia without behavioral disturbance: Secondary | ICD-10-CM | POA: Diagnosis present

## 2023-07-03 DIAGNOSIS — Y92512 Supermarket, store or market as the place of occurrence of the external cause: Secondary | ICD-10-CM

## 2023-07-03 DIAGNOSIS — I4891 Unspecified atrial fibrillation: Secondary | ICD-10-CM | POA: Diagnosis present

## 2023-07-03 DIAGNOSIS — J9611 Chronic respiratory failure with hypoxia: Secondary | ICD-10-CM

## 2023-07-03 DIAGNOSIS — K219 Gastro-esophageal reflux disease without esophagitis: Secondary | ICD-10-CM | POA: Diagnosis present

## 2023-07-03 DIAGNOSIS — N35014 Post-traumatic urethral stricture, male, unspecified: Secondary | ICD-10-CM | POA: Diagnosis not present

## 2023-07-03 DIAGNOSIS — J189 Pneumonia, unspecified organism: Secondary | ICD-10-CM | POA: Diagnosis not present

## 2023-07-03 DIAGNOSIS — R3916 Straining to void: Secondary | ICD-10-CM | POA: Diagnosis present

## 2023-07-03 DIAGNOSIS — R3912 Poor urinary stream: Secondary | ICD-10-CM | POA: Diagnosis present

## 2023-07-03 DIAGNOSIS — I2699 Other pulmonary embolism without acute cor pulmonale: Secondary | ICD-10-CM | POA: Clinically undetermined

## 2023-07-03 DIAGNOSIS — I10 Essential (primary) hypertension: Secondary | ICD-10-CM | POA: Diagnosis present

## 2023-07-03 DIAGNOSIS — N471 Phimosis: Secondary | ICD-10-CM | POA: Diagnosis present

## 2023-07-03 DIAGNOSIS — Z515 Encounter for palliative care: Secondary | ICD-10-CM

## 2023-07-03 DIAGNOSIS — I110 Hypertensive heart disease with heart failure: Secondary | ICD-10-CM | POA: Diagnosis present

## 2023-07-03 DIAGNOSIS — I2693 Single subsegmental pulmonary embolism without acute cor pulmonale: Secondary | ICD-10-CM | POA: Diagnosis not present

## 2023-07-03 DIAGNOSIS — S72002A Fracture of unspecified part of neck of left femur, initial encounter for closed fracture: Secondary | ICD-10-CM | POA: Diagnosis not present

## 2023-07-03 DIAGNOSIS — N401 Enlarged prostate with lower urinary tract symptoms: Secondary | ICD-10-CM | POA: Diagnosis present

## 2023-07-03 DIAGNOSIS — Z66 Do not resuscitate: Secondary | ICD-10-CM | POA: Diagnosis present

## 2023-07-03 DIAGNOSIS — Y9301 Activity, walking, marching and hiking: Secondary | ICD-10-CM | POA: Diagnosis present

## 2023-07-03 DIAGNOSIS — R6521 Severe sepsis with septic shock: Secondary | ICD-10-CM | POA: Diagnosis not present

## 2023-07-03 DIAGNOSIS — S72002S Fracture of unspecified part of neck of left femur, sequela: Secondary | ICD-10-CM | POA: Diagnosis not present

## 2023-07-03 DIAGNOSIS — R351 Nocturia: Secondary | ICD-10-CM | POA: Diagnosis present

## 2023-07-03 DIAGNOSIS — J441 Chronic obstructive pulmonary disease with (acute) exacerbation: Secondary | ICD-10-CM | POA: Diagnosis present

## 2023-07-03 DIAGNOSIS — Z7951 Long term (current) use of inhaled steroids: Secondary | ICD-10-CM

## 2023-07-03 DIAGNOSIS — A419 Sepsis, unspecified organism: Secondary | ICD-10-CM | POA: Diagnosis not present

## 2023-07-03 DIAGNOSIS — S72142A Displaced intertrochanteric fracture of left femur, initial encounter for closed fracture: Secondary | ICD-10-CM | POA: Diagnosis not present

## 2023-07-03 DIAGNOSIS — I480 Paroxysmal atrial fibrillation: Secondary | ICD-10-CM | POA: Diagnosis present

## 2023-07-03 DIAGNOSIS — R531 Weakness: Secondary | ICD-10-CM | POA: Diagnosis not present

## 2023-07-03 DIAGNOSIS — E43 Unspecified severe protein-calorie malnutrition: Secondary | ICD-10-CM | POA: Diagnosis present

## 2023-07-03 DIAGNOSIS — Z823 Family history of stroke: Secondary | ICD-10-CM

## 2023-07-03 DIAGNOSIS — Z7982 Long term (current) use of aspirin: Secondary | ICD-10-CM

## 2023-07-03 DIAGNOSIS — J432 Centrilobular emphysema: Secondary | ICD-10-CM | POA: Diagnosis not present

## 2023-07-03 DIAGNOSIS — S72002D Fracture of unspecified part of neck of left femur, subsequent encounter for closed fracture with routine healing: Secondary | ICD-10-CM | POA: Diagnosis not present

## 2023-07-03 DIAGNOSIS — Z7189 Other specified counseling: Secondary | ICD-10-CM | POA: Diagnosis not present

## 2023-07-03 DIAGNOSIS — J9621 Acute and chronic respiratory failure with hypoxia: Secondary | ICD-10-CM | POA: Diagnosis not present

## 2023-07-03 DIAGNOSIS — R296 Repeated falls: Secondary | ICD-10-CM | POA: Diagnosis present

## 2023-07-03 DIAGNOSIS — J449 Chronic obstructive pulmonary disease, unspecified: Secondary | ICD-10-CM | POA: Diagnosis present

## 2023-07-03 DIAGNOSIS — R339 Retention of urine, unspecified: Secondary | ICD-10-CM | POA: Diagnosis not present

## 2023-07-03 DIAGNOSIS — I5032 Chronic diastolic (congestive) heart failure: Secondary | ICD-10-CM | POA: Diagnosis present

## 2023-07-03 DIAGNOSIS — K5900 Constipation, unspecified: Secondary | ICD-10-CM | POA: Diagnosis not present

## 2023-07-03 DIAGNOSIS — S72009A Fracture of unspecified part of neck of unspecified femur, initial encounter for closed fracture: Secondary | ICD-10-CM | POA: Diagnosis present

## 2023-07-03 DIAGNOSIS — Z9841 Cataract extraction status, right eye: Secondary | ICD-10-CM

## 2023-07-03 DIAGNOSIS — Z952 Presence of prosthetic heart valve: Secondary | ICD-10-CM | POA: Diagnosis not present

## 2023-07-03 DIAGNOSIS — W1830XA Fall on same level, unspecified, initial encounter: Secondary | ICD-10-CM | POA: Diagnosis present

## 2023-07-03 DIAGNOSIS — R9082 White matter disease, unspecified: Secondary | ICD-10-CM | POA: Diagnosis not present

## 2023-07-03 DIAGNOSIS — J90 Pleural effusion, not elsewhere classified: Secondary | ICD-10-CM | POA: Diagnosis not present

## 2023-07-03 DIAGNOSIS — R338 Other retention of urine: Secondary | ICD-10-CM | POA: Diagnosis present

## 2023-07-03 DIAGNOSIS — I350 Nonrheumatic aortic (valve) stenosis: Secondary | ICD-10-CM | POA: Diagnosis not present

## 2023-07-03 DIAGNOSIS — Z4789 Encounter for other orthopedic aftercare: Secondary | ICD-10-CM | POA: Diagnosis not present

## 2023-07-03 DIAGNOSIS — Z8249 Family history of ischemic heart disease and other diseases of the circulatory system: Secondary | ICD-10-CM

## 2023-07-03 DIAGNOSIS — Z79899 Other long term (current) drug therapy: Secondary | ICD-10-CM

## 2023-07-03 DIAGNOSIS — J440 Chronic obstructive pulmonary disease with acute lower respiratory infection: Secondary | ICD-10-CM | POA: Diagnosis not present

## 2023-07-03 DIAGNOSIS — S72142D Displaced intertrochanteric fracture of left femur, subsequent encounter for closed fracture with routine healing: Secondary | ICD-10-CM | POA: Diagnosis not present

## 2023-07-03 DIAGNOSIS — S62002D Unspecified fracture of navicular [scaphoid] bone of left wrist, subsequent encounter for fracture with routine healing: Secondary | ICD-10-CM | POA: Diagnosis not present

## 2023-07-03 DIAGNOSIS — R0602 Shortness of breath: Secondary | ICD-10-CM | POA: Diagnosis not present

## 2023-07-03 DIAGNOSIS — N35919 Unspecified urethral stricture, male, unspecified site: Secondary | ICD-10-CM | POA: Diagnosis present

## 2023-07-03 DIAGNOSIS — R1310 Dysphagia, unspecified: Secondary | ICD-10-CM | POA: Diagnosis present

## 2023-07-03 DIAGNOSIS — Z9981 Dependence on supplemental oxygen: Secondary | ICD-10-CM | POA: Diagnosis not present

## 2023-07-03 DIAGNOSIS — F17210 Nicotine dependence, cigarettes, uncomplicated: Secondary | ICD-10-CM | POA: Diagnosis present

## 2023-07-03 DIAGNOSIS — E785 Hyperlipidemia, unspecified: Secondary | ICD-10-CM | POA: Diagnosis present

## 2023-07-03 DIAGNOSIS — J984 Other disorders of lung: Secondary | ICD-10-CM | POA: Diagnosis not present

## 2023-07-03 DIAGNOSIS — R609 Edema, unspecified: Secondary | ICD-10-CM | POA: Diagnosis not present

## 2023-07-03 DIAGNOSIS — J470 Bronchiectasis with acute lower respiratory infection: Secondary | ICD-10-CM | POA: Diagnosis not present

## 2023-07-03 DIAGNOSIS — W19XXXA Unspecified fall, initial encounter: Principal | ICD-10-CM

## 2023-07-03 DIAGNOSIS — F17200 Nicotine dependence, unspecified, uncomplicated: Secondary | ICD-10-CM | POA: Diagnosis not present

## 2023-07-03 DIAGNOSIS — S2239XA Fracture of one rib, unspecified side, initial encounter for closed fracture: Secondary | ICD-10-CM | POA: Diagnosis not present

## 2023-07-03 DIAGNOSIS — Z452 Encounter for adjustment and management of vascular access device: Secondary | ICD-10-CM | POA: Diagnosis not present

## 2023-07-03 DIAGNOSIS — Z043 Encounter for examination and observation following other accident: Secondary | ICD-10-CM | POA: Diagnosis not present

## 2023-07-03 DIAGNOSIS — R918 Other nonspecific abnormal finding of lung field: Secondary | ICD-10-CM | POA: Diagnosis not present

## 2023-07-03 DIAGNOSIS — Z841 Family history of disorders of kidney and ureter: Secondary | ICD-10-CM

## 2023-07-03 DIAGNOSIS — I2609 Other pulmonary embolism with acute cor pulmonale: Secondary | ICD-10-CM | POA: Diagnosis not present

## 2023-07-03 DIAGNOSIS — R0902 Hypoxemia: Secondary | ICD-10-CM | POA: Diagnosis not present

## 2023-07-03 DIAGNOSIS — M160 Bilateral primary osteoarthritis of hip: Secondary | ICD-10-CM | POA: Diagnosis not present

## 2023-07-03 DIAGNOSIS — E782 Mixed hyperlipidemia: Secondary | ICD-10-CM | POA: Diagnosis not present

## 2023-07-03 DIAGNOSIS — I959 Hypotension, unspecified: Secondary | ICD-10-CM | POA: Diagnosis not present

## 2023-07-03 LAB — BASIC METABOLIC PANEL
Anion gap: 8 (ref 5–15)
BUN: 17 mg/dL (ref 8–23)
CO2: 27 mmol/L (ref 22–32)
Calcium: 8.2 mg/dL — ABNORMAL LOW (ref 8.9–10.3)
Chloride: 102 mmol/L (ref 98–111)
Creatinine, Ser: 1.05 mg/dL (ref 0.61–1.24)
GFR, Estimated: >60 mL/min (ref >60)
Glucose, Bld: 123 mg/dL — ABNORMAL HIGH (ref 70–99)
Potassium: 4.1 mmol/L (ref 3.5–5.1)
Sodium: 137 mmol/L (ref 135–145)

## 2023-07-03 LAB — CBC
HCT: 39.8 % (ref 39.0–52.0)
Hemoglobin: 12.9 g/dL — ABNORMAL LOW (ref 13.0–17.0)
MCH: 31.2 pg (ref 26.0–34.0)
MCHC: 32.4 g/dL (ref 30.0–36.0)
MCV: 96.1 fL (ref 80.0–100.0)
Platelets: 201 10*3/uL (ref 150–400)
RBC: 4.14 MIL/uL — ABNORMAL LOW (ref 4.22–5.81)
RDW: 13.8 % (ref 11.5–15.5)
WBC: 13.9 10*3/uL — ABNORMAL HIGH (ref 4.0–10.5)
nRBC: 0 % (ref 0.0–0.2)

## 2023-07-03 LAB — MRSA NEXT GEN BY PCR, NASAL: MRSA by PCR Next Gen: NOT DETECTED

## 2023-07-03 MED ORDER — MORPHINE SULFATE (PF) 2 MG/ML IV SOLN
0.5000 mg | INTRAVENOUS | Status: DC | PRN
  Administered 2023-07-03: 0.5 mg via INTRAVENOUS
  Filled 2023-07-03: qty 1

## 2023-07-03 MED ORDER — HYDROCODONE-ACETAMINOPHEN 5-325 MG PO TABS
1.0000 | ORAL_TABLET | Freq: Four times a day (QID) | ORAL | Status: DC | PRN
  Administered 2023-07-03 – 2023-07-04 (×2): 1 via ORAL
  Filled 2023-07-03 (×2): qty 1

## 2023-07-03 MED ORDER — PANTOPRAZOLE SODIUM 40 MG PO TBEC
40.0000 mg | DELAYED_RELEASE_TABLET | Freq: Every day | ORAL | Status: DC
  Administered 2023-07-04 – 2023-07-11 (×7): 40 mg via ORAL
  Filled 2023-07-03 (×8): qty 1

## 2023-07-03 MED ORDER — FENTANYL CITRATE PF 50 MCG/ML IJ SOSY
50.0000 ug | PREFILLED_SYRINGE | Freq: Once | INTRAMUSCULAR | Status: AC
  Administered 2023-07-03: 50 ug via INTRAVENOUS
  Filled 2023-07-03: qty 1

## 2023-07-03 MED ORDER — TRANEXAMIC ACID-NACL 1000-0.7 MG/100ML-% IV SOLN
1000.0000 mg | INTRAVENOUS | Status: AC
  Administered 2023-07-04: 1000 mg via INTRAVENOUS

## 2023-07-03 MED ORDER — POLYETHYLENE GLYCOL 3350 17 G PO PACK: 17.0000 g | PACK | Freq: Every day | ORAL | Status: DC | PRN

## 2023-07-03 MED ORDER — METHOCARBAMOL 1000 MG/10ML IJ SOLN
500.0000 mg | Freq: Four times a day (QID) | INTRAMUSCULAR | Status: DC | PRN
  Filled 2023-07-03: qty 5

## 2023-07-03 MED ORDER — CEFAZOLIN SODIUM-DEXTROSE 2-4 GM/100ML-% IV SOLN
2.0000 g | INTRAVENOUS | Status: AC
  Administered 2023-07-04: 2 g via INTRAVENOUS

## 2023-07-03 MED ORDER — BUDESONIDE 0.25 MG/2ML IN SUSP
0.2500 mg | Freq: Two times a day (BID) | RESPIRATORY_TRACT | Status: DC
  Administered 2023-07-03 – 2023-07-06 (×6): 0.25 mg via RESPIRATORY_TRACT
  Filled 2023-07-03 (×6): qty 2

## 2023-07-03 MED ORDER — TAMSULOSIN HCL 0.4 MG PO CAPS
0.8000 mg | ORAL_CAPSULE | Freq: Every day | ORAL | Status: DC
  Administered 2023-07-03 – 2023-07-11 (×9): 0.8 mg via ORAL
  Filled 2023-07-03 (×9): qty 2

## 2023-07-03 MED ORDER — METHOCARBAMOL 500 MG PO TABS
500.0000 mg | ORAL_TABLET | Freq: Four times a day (QID) | ORAL | Status: DC | PRN
  Administered 2023-07-10: 500 mg via ORAL
  Filled 2023-07-03: qty 1

## 2023-07-03 MED ORDER — DONEPEZIL HCL 5 MG PO TABS
10.0000 mg | ORAL_TABLET | Freq: Every morning | ORAL | Status: DC
  Administered 2023-07-04 – 2023-07-11 (×8): 10 mg via ORAL
  Filled 2023-07-03 (×8): qty 2

## 2023-07-03 MED ORDER — MIDODRINE HCL 5 MG PO TABS
10.0000 mg | ORAL_TABLET | Freq: Three times a day (TID) | ORAL | Status: DC
  Administered 2023-07-03 – 2023-07-11 (×21): 10 mg via ORAL
  Filled 2023-07-03 (×23): qty 2

## 2023-07-03 MED ORDER — CHLORHEXIDINE GLUCONATE 4 % EX SOLN
60.0000 mL | Freq: Once | CUTANEOUS | Status: AC
  Administered 2023-07-04: 4 via TOPICAL
  Filled 2023-07-03: qty 60

## 2023-07-03 MED ORDER — ATORVASTATIN CALCIUM 10 MG PO TABS
10.0000 mg | ORAL_TABLET | Freq: Every day | ORAL | Status: DC
  Administered 2023-07-03 – 2023-07-11 (×9): 10 mg via ORAL
  Filled 2023-07-03 (×9): qty 1

## 2023-07-03 MED ORDER — POVIDONE-IODINE 10 % EX SWAB: 2.0000 | Freq: Once | CUTANEOUS | Status: DC

## 2023-07-03 MED ORDER — VITAMIN B-12 1000 MCG PO TABS
1000.0000 ug | ORAL_TABLET | Freq: Every day | ORAL | Status: DC
Start: 2023-07-04 — End: 2023-07-11
  Administered 2023-07-04 – 2023-07-11 (×7): 1000 ug via ORAL
  Filled 2023-07-03 (×8): qty 1

## 2023-07-03 MED ORDER — HEPARIN SODIUM (PORCINE) 5000 UNIT/ML IJ SOLN
5000.0000 [IU] | Freq: Three times a day (TID) | INTRAMUSCULAR | Status: DC
Start: 2023-07-04 — End: 2023-07-04

## 2023-07-03 NOTE — ED Provider Notes (Signed)
[place] EMERGENCY DEPARTMENT AT [facility] Provider Note   CSN: [id number] Arrival date & time: 07/03/23  1043     History  Chief Complaint  Patient presents with   Travis Murphy is a 88 y.o. male.  88 year male brought in by daughter for left hip pain after a fall. Patient was walking out of a convenience store today when he fell for unknown reasons. Denies feeling weak or dizzy. States he did hit his head and is not on blood thinners. Patient was able to stand with assistance from bystanders and was put into his daughter's car but by the time she got him home he was in too much pain to get out of the car so she brought him to the emergency room. No prior hip problems, no other injuries, complaints, concerns.  NPO since 9am (ate biscuit and soda) On hospice for COPD with O2 requirement        Home Medications Prior to Admission medications   Medication Sig Start Date End Date Taking? Authorizing Provider  albuterol (PROVENTIL) (2.5 MG/3ML) 0.083% nebulizer solution TAKE 3 ML (2.5 MG TOTAL) BY NEBULIZATION EVERY 4 HOURS AS NEEDED FOR WHEEZING OR SHORTNESS OF BREATH 05/12/23  Yes Nyoka Cowden, MD  aspirin EC 81 MG tablet Take 1 tablet (81 mg total) by mouth daily with breakfast. 08/27/22  Yes Emokpae, Courage, MD  atorvastatin (LIPITOR) 10 MG tablet Take 10 mg by mouth daily. 11/15/15  Yes [provider]  budesonide (PULMICORT) 0.25 MG/2ML nebulizer solution One vial twice daily with albuterol 02/20/23  Yes Nyoka Cowden, MD  dextromethorphan-guaiFENesin [facility] DM) 30-600 MG 12hr tablet Take 1 tablet by mouth 2 (two) times daily.   Yes [provider]  donepezil (ARICEPT) 10 MG tablet Take 10 mg by mouth every morning. 02/11/22  Yes [provider]  famotidine (PEPCID) 20 MG tablet One after supper 03/06/21  Yes Nyoka Cowden, MD  midodrine (PROAMATINE) 10 MG tablet Take 1 tablet (10 mg total) by mouth 3 (three) times daily  with meals. 08/27/22  Yes Emokpae, Courage, MD  Nebulizers (COMPRESSOR/NEBULIZER) MISC 1 Units by Does not apply route daily as needed. 08/27/22  Yes Emokpae, Courage, MD  pantoprazole (PROTONIX) 40 MG tablet TAKE 1 TABLET (40 MG TOTAL) BY MOUTH DAILY. TAKE 30-60 MIN BEFORE FIRST MEAL OF THE DAY 08/30/21  Yes Nyoka Cowden, MD  tamsulosin (FLOMAX) 0.4 MG CAPS capsule Take 0.8 mg by mouth daily.   Yes [provider]  vitamin B-12 (CYANOCOBALAMIN) 1000 MCG tablet Take 1,000 mcg by mouth daily.   Yes [provider]      Allergies    Patient has no known allergies.    Review of Systems   Review of Systems Negative except as per HPI Physical Exam Updated Vital Signs BP 115/68   Pulse 61   Temp 98.4 F (36.9 C) (Oral)   Resp 17   Ht 5\' 6"  (1.676 m)   Wt 54.4 kg   SpO2 97%   BMI 19.37 kg/m  Physical Exam Vitals and nursing note reviewed.  Constitutional:      General: He is not in acute distress.    Appearance: He is well-developed. He is not diaphoretic.  HENT:     Head: Normocephalic and atraumatic.  Eyes:     Extraocular Movements: Extraocular movements intact.     Pupils: Pupils are equal, round, and reactive to light.  Pulmonary:  Effort: Pulmonary effort is normal.  Abdominal:     Palpations: Abdomen is soft.     Tenderness: There is no abdominal tenderness.  Musculoskeletal:        General: Tenderness and signs of injury present. No swelling.     Right shoulder: Normal.     Left shoulder: Normal.     Right elbow: Normal.     Left elbow: Normal.     Right wrist: Normal.     Left wrist: Normal.     Cervical back: Neck supple. No tenderness.     Right lower leg: No edema.     Left lower leg: No edema.     Comments: Left leg externally rotated and shortened, TTP over left hip, no pain with palpation of left knee/ankle. Refuses ROM of left leg due to pain in the left hip.   Skin:    General: Skin is warm and dry.     Findings: No erythema or rash.   Neurological:     General: No focal deficit present.     Mental Status: He is alert.     Sensory: No sensory deficit.  Psychiatric:        Behavior: Behavior normal.     ED Results / Procedures / Treatments   Labs (all labs ordered are listed, but only abnormal results are displayed) Labs Reviewed  CBC - Abnormal; Notable for the following components:      Result Value   WBC 13.9 (*)    RBC 4.14 (*)    Hemoglobin 12.9 (*)    All other components within normal limits  BASIC METABOLIC PANEL - Abnormal; Notable for the following components:   Glucose, Bld 123 (*)    Calcium 8.2 (*)    All other components within normal limits  URINALYSIS, ROUTINE W REFLEX MICROSCOPIC    EKG None  Radiology DG Chest Port 1 View  Result Date: 07/03/2023 CLINICAL DATA:  fall. EXAM: PORTABLE CHEST 1 VIEW COMPARISON:  06/24/2023. FINDINGS: Redemonstration of increased interstitial markings throughout bilateral lungs, which may represent underlying chronic interstitial lung disease versus emphysema. No frank pulmonary edema. No acute consolidation or lung collapse. Bilateral lateral costophrenic angles are clear. Stable cardio-mediastinal silhouette. Prosthetic aortic valve noted. No acute osseous abnormalities. The soft tissues are within normal limits. IMPRESSION: *No acute cardiopulmonary abnormality. *Redemonstration of increased interstitial markings, which may represent chronic interstitial lung disease versus emphysema. Electronically Signed   By: Jules Schick M.D.   On: 07/03/2023 15:06   DG Hip Unilat With Pelvis 2-3 Views Left  Result Date: 07/03/2023 CLINICAL DATA:  Fall.  Left hip pain. EXAM: DG HIP (WITH OR WITHOUT PELVIS) 2-3V LEFT COMPARISON:  None Available. FINDINGS: There is mildly displaced intertrochanteric left femur fracture. No other acute fracture or dislocation. No aggressive osseous lesion. Visualized sacral arcuate lines are unremarkable. There are mild degenerative changes  of bilateral hip joints without significant joint space narrowing. Osteophytosis of the superior acetabulum. No radiopaque foreign bodies. IMPRESSION: *Mildly displaced left intertrochanteric femur fracture. Electronically Signed   By: Jules Schick M.D.   On: 07/03/2023 15:04   CT Head Wo Contrast  Result Date: 07/03/2023 CLINICAL DATA:  Fall, weakness EXAM: CT HEAD WITHOUT CONTRAST CT CERVICAL SPINE WITHOUT CONTRAST TECHNIQUE: Multidetector CT imaging of the head and cervical spine was performed following the standard protocol without intravenous contrast. Multiplanar CT image reconstructions of the cervical spine were also generated. RADIATION DOSE REDUCTION: This exam was performed according to the departmental  dose-optimization program which includes automated exposure control, adjustment of the mA and/or kV according to patient size and/or use of iterative reconstruction technique. COMPARISON:  06/24/2023 FINDINGS: CT HEAD FINDINGS Brain: No evidence of acute infarction, hemorrhage, hydrocephalus, extra-axial collection or mass lesion/mass effect. Extensive periventricular and deep white matter hypodensity. Vascular: No hyperdense vessel or unexpected calcification. Skull: Normal. Negative for fracture or focal lesion. Sinuses/Orbits: No acute finding. Other: None. CT CERVICAL SPINE FINDINGS Alignment: Normal. Skull base and vertebrae: No acute fracture. No primary bone lesion or focal pathologic process. Soft tissues and spinal canal: No prevertebral fluid or swelling. No visible canal hematoma. Disc levels: Mild-to-moderate disc space height loss and osteophytosis of the lower cervical levels, worst from C5-C7. Upper chest: Negative. Other: None. IMPRESSION: 1. No acute intracranial pathology. Advanced small-vessel white matter disease in keeping with patient age. 2. No fracture or static subluxation of the cervical spine. 3. Mild-to-moderate disc space height loss and osteophytosis of the lower  cervical levels, worst from C5-C7. Electronically Signed   By: Jearld Lesch M.D.   On: 07/03/2023 14:02   CT Cervical Spine Wo Contrast  Result Date: 07/03/2023 CLINICAL DATA:  Fall, weakness EXAM: CT HEAD WITHOUT CONTRAST CT CERVICAL SPINE WITHOUT CONTRAST TECHNIQUE: Multidetector CT imaging of the head and cervical spine was performed following the standard protocol without intravenous contrast. Multiplanar CT image reconstructions of the cervical spine were also generated. RADIATION DOSE REDUCTION: This exam was performed according to the departmental dose-optimization program which includes automated exposure control, adjustment of the mA and/or kV according to patient size and/or use of iterative reconstruction technique. COMPARISON:  06/24/2023 FINDINGS: CT HEAD FINDINGS Brain: No evidence of acute infarction, hemorrhage, hydrocephalus, extra-axial collection or mass lesion/mass effect. Extensive periventricular and deep white matter hypodensity. Vascular: No hyperdense vessel or unexpected calcification. Skull: Normal. Negative for fracture or focal lesion. Sinuses/Orbits: No acute finding. Other: None. CT CERVICAL SPINE FINDINGS Alignment: Normal. Skull base and vertebrae: No acute fracture. No primary bone lesion or focal pathologic process. Soft tissues and spinal canal: No prevertebral fluid or swelling. No visible canal hematoma. Disc levels: Mild-to-moderate disc space height loss and osteophytosis of the lower cervical levels, worst from C5-C7. Upper chest: Negative. Other: None. IMPRESSION: 1. No acute intracranial pathology. Advanced small-vessel white matter disease in keeping with patient age. 2. No fracture or static subluxation of the cervical spine. 3. Mild-to-moderate disc space height loss and osteophytosis of the lower cervical levels, worst from C5-C7. Electronically Signed   By: Jearld Lesch M.D.   On: 07/03/2023 14:02    Procedures Procedures    Medications Ordered in  ED Medications  midodrine (PROAMATINE) tablet 10 mg (10 mg Oral Given 07/03/23 1637)  fentaNYL (SUBLIMAZE) injection 50 mcg (50 mcg Intravenous Given 07/03/23 1323)    ED Course/ Medical Decision Making/ A&P                                 Medical Decision Making Amount and/or Complexity of Data Reviewed Labs: ordered. Radiology: ordered.  Risk Prescription drug management. Decision regarding hospitalization.   This patient presents to the ED for concern of injury from fall, this involves an extensive number of treatment options, and is a complaint that carries with it a high risk of complications and morbidity.  The differential diagnosis includes fracture, dislocation, intracranial injury   Co morbidities that complicate the patient evaluation  dementia, COPD on 2 L oxygen,  aortic stenosis, hypotension on midodrine, and hyperlipidemia    Additional history obtained:  Additional history obtained from daughter at bedside who contributes to history as above External records from outside source obtained and reviewed including prior labs on file   Lab Tests:  I Ordered, and personally interpreted labs.  The pertinent results include:  BMP without significant findings. CBC with mild leukocytosis, not significantly changed from prior 9 days ago   Imaging Studies ordered:  I ordered imaging studies including CT head, CT c-spine, XR left hip, CXR  I independently visualized and interpreted imaging which showed left hip fx I agree with the radiologist interpretation   Consultations Obtained:  I requested consultation with the ER attending, Dr. Hyacinth Meeker,  and discussed lab and imaging findings as well as pertinent plan - they recommend: consult ortho for intertrochanteric hip fx Case discussed with Dr. Romeo Apple who recommends admit to hospitalist, will talk to patient and family about care options.  Case discussed with Dr. Randol Kern with [facility] who will consult  for admission    Problem List / ED Course / Critical interventions / Medication management  88 year old male brought in by daughter with concern for left hip pain after a fall.  Patient with shortened left leg which is externally rotated, found to have intertrochanteric fracture on x-ray.  Discussed with orthopedics, wanted to meet with patient and family to discuss management, request hospitalist admit.  Discussed with hospitalist will consult for admission. I ordered medication including fentanyl  for pain  Reevaluation of the patient after these medicines showed that the patient improved I have reviewed the patients home medicines and have made adjustments as needed   Social Determinants of Health:  Lives with daughter    Test / Admission - Considered:  admit         Final Clinical Impression(s) / ED Diagnoses Final diagnoses:  Fall, initial encounter  Intertrochanteric fracture of left hip, closed, initial encounter [facility])    Rx / DC Orders ED Discharge Orders     None         Jeannie Fend, PA-C 07/03/23 1645    Eber Hong, MD 07/04/23 [phone]

## 2023-07-03 NOTE — ED Triage Notes (Signed)
Pt states he got out of the car and fell. He is unsure why he fell, daughter thinks his legs are weaker than normal because he has been standing a lot lately. Pt complaining of left hip pain.

## 2023-07-03 NOTE — H&P (Addendum)
TRH H&P   Patient Demographics:    Travis Murphy, is a 88 y.o. male  MRN: [id number]   DOB - 27-Sep-1934  Admit Date - 07/03/2023  Outpatient Primary MD for the patient is Carylon Perches, MD  Referring MD/NP/PA: PA laura  Outpatient Specialists: Cardiology Dr. Diona Browner  Patient coming from: Home  Chief Complaint  Patient presents with   Fall      HPI:    Travis Murphy  is a 88 y.o. male, past medical history of COPD, on 2 L nasal cannula at home intermittently, severe aortic stenosis status post TAVR, hypertension, dementia. -Presents to ED secondary to fall, and left hip pain, patient was walking out of convenience store today, when he fell, he denies feeling weak, dizzy, no loss of consciousness, he did report he did hit his head, but he is not on any blood thinners, he was able to stand with assistance from bystanders, and he was put into his daughter's car, who was able to get him home, but he was in too much pain, to get out of car, so she brought him to ED, patient had another fall recently, last week, but he denies any trauma.  Patient is on hospice for COPD -ED his workup significant for mild leukocytosis at 13.9, he was afebrile, his chest x-ray significant for chronic COPD, his left hip x-ray significant for mildly displaced left intertrochanteric fracture, ED physician discussed with Dr. Romeo Apple who requested admission.    Review of systems:      A full 10 point Review of Systems was done, except as stated above, all other Review of Systems were negative.   With Past History of the following :    Past Medical History:  Diagnosis Date   Arthritis    Essential hypertension    Hyperlipemia    Hyperplastic colon polyp 2002   Dr. Lovell Sheehan   Pulmonary nodule    a. right lower lobe. 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 severe AS by echo in 2017 b. 01/2018: repeat echo showing EF of 55-60%, Grade 2 DD, and severe AS with mean gradient of 58 mm Hg   Vertigo       Past Surgical History:  Procedure Laterality Date   CATARACT EXTRACTION Right    COLONOSCOPY  08/17/2001   Small polyp measured 3 mm removed/multiple middle scattered diverticula in the ascending colon-hyperplastic   COLONOSCOPY  08/14/2011   Procedure: COLONOSCOPY;  Surgeon: Corbin Ade, MD;  Location: [facility];  Service: Endoscopy;  Laterality: N/A;  10:00   EXCISIONAL HEMORRHOIDECTOMY  INTRAOPERATIVE TRANSTHORACIC ECHOCARDIOGRAM N/A 04/21/2018   Procedure: INTRAOPERATIVE TRANSTHORACIC ECHOCARDIOGRAM;  Surgeon: Kathleene Hazel, MD;  Location: [facility] OR;  Service: Open Heart Surgery;  Laterality: N/A;   RIGHT/LEFT HEART CATH AND CORONARY ANGIOGRAPHY N/A 03/25/2018   Procedure: RIGHT/LEFT HEART CATH AND CORONARY ANGIOGRAPHY;  Surgeon: Kathleene Hazel, MD;  Location: [facility] INVASIVE CV LAB;  Service: Cardiovascular;  Laterality: N/A;   TRANSCATHETER AORTIC VALVE REPLACEMENT, TRANSFEMORAL  04/21/2018   TRANSCATHETER AORTIC VALVE REPLACEMENT, TRANSFEMORAL N/A 04/21/2018   Procedure: TRANSCATHETER AORTIC VALVE REPLACEMENT, TRANSFEMORAL;  Surgeon: Kathleene Hazel, MD;  Location: [facility] OR;  Service: Open Heart Surgery;  Laterality: N/A;      Social History:     Social History   Tobacco Use   Smoking status: Every Day    Current packs/day: 1.00    Average packs/day: 1 pack/day for 65.0 years (65.0 ttl pk-yrs)    Types: Cigarettes    Passive exposure: Current   Smokeless tobacco: Never   Tobacco comments:    Verified by Daughter - Derenda Mis    Smoking Cessation Offered.  Substance Use  Topics   Alcohol use: Not Currently    Alcohol/week: 0.0 standard drinks of alcohol       Family History :     Family History  Problem Relation Age of Onset   Stroke Mother    Heart attack Father    Liver disease Sister    Renal Disease Brother       Home Medications:   Prior to Admission medications   Medication Sig Start Date End Date Taking? Authorizing Provider  albuterol (PROVENTIL) (2.5 MG/3ML) 0.083% nebulizer solution TAKE 3 ML (2.5 MG TOTAL) BY NEBULIZATION EVERY 4 HOURS AS NEEDED FOR WHEEZING OR SHORTNESS OF BREATH 05/12/23  Yes Nyoka Cowden, MD  aspirin EC 81 MG tablet Take 1 tablet (81 mg total) by mouth daily with breakfast. 08/27/22  Yes Emokpae, Courage, MD  atorvastatin (LIPITOR) 10 MG tablet Take 10 mg by mouth daily. 11/15/15  Yes [provider]  budesonide (PULMICORT) 0.25 MG/2ML nebulizer solution One vial twice daily with albuterol 02/20/23  Yes Nyoka Cowden, MD  dextromethorphan-guaiFENesin [facility] DM) 30-600 MG 12hr tablet Take 1 tablet by mouth 2 (two) times daily.   Yes [provider]  donepezil (ARICEPT) 10 MG tablet Take 10 mg by mouth every morning. 02/11/22  Yes [provider]  famotidine (PEPCID) 20 MG tablet One after supper 03/06/21  Yes Nyoka Cowden, MD  midodrine (PROAMATINE) 10 MG tablet Take 1 tablet (10 mg total) by mouth 3 (three) times daily with meals. 08/27/22  Yes Emokpae, Courage, MD  Nebulizers (COMPRESSOR/NEBULIZER) MISC 1 Units by Does not apply route daily as needed. 08/27/22  Yes Emokpae, Courage, MD  pantoprazole (PROTONIX) 40 MG tablet TAKE 1 TABLET (40 MG TOTAL) BY MOUTH DAILY. TAKE 30-60 MIN BEFORE FIRST MEAL OF THE DAY 08/30/21  Yes Nyoka Cowden, MD  tamsulosin (FLOMAX) 0.4 MG CAPS capsule Take 0.8 mg by mouth daily.   Yes [provider]  vitamin B-12 (CYANOCOBALAMIN) 1000 MCG tablet Take 1,000 mcg by mouth daily.   Yes [provider]     Allergies:    No Known Allergies    Physical Exam:   Vitals  Blood pressure 115/68, pulse 61, temperature 98 F (36.7 C), temperature source Oral, resp. rate 17, height 5\' 6"  (1.676 m), weight 54.4 kg, SpO2 97%.   1. General Frail elderly male, lying in bed, in  mild discomfort secondary to pain  2. Normal affect and insight, Not Suicidal or Homicidal, Awake Alert, Oriented X 3.  3. No F.N deficits, ALL C.Nerves Intact, Strength 5/5 all 4 extremities, Sensation intact all 4 extremities, Plantars down going.  (Left lower extremity range of motion limited due to pain  4. Ears and Eyes appear Normal, Conjunctivae clear, PERRLA. Moist Oral Mucosa.  5. Supple Neck, No JVD, No cervical lymphadenopathy appriciated, No Carotid Bruits.  6. Symmetrical Chest wall movement, air entry bilaterally, but diminished at the bases, no wheezing  7. RRR, No Gallops, Rubs, mild murmurs, No Parasternal Heave.  8. Positive Bowel Sounds, Abdomen Soft, No tenderness, No organomegaly appriciated,No rebound -guarding or rigidity.  9.  No Cyanosis, Normal Skin Turgor, No Skin Rash or Bruise.  10. Good muscle tone,  joints appear normal , no effusions, Normal ROM.  11. No Palpable Lymph Nodes in Neck or Axillae     Data Review:    CBC Recent Labs  Lab 07/03/23 1229  WBC 13.9*  HGB 12.9*  HCT 39.8  PLT 201  MCV 96.1  MCH 31.2  MCHC 32.4  RDW 13.8   ------------------------------------------------------------------------------------------------------------------  Chemistries  Recent Labs  Lab 07/03/23 1229  NA 137  K 4.1  CL 102  CO2 27  GLUCOSE 123*  BUN 17  CREATININE 1.05  CALCIUM 8.2*   ------------------------------------------------------------------------------------------------------------------ estimated creatinine clearance is 38.9 mL/min (by C-G formula based on SCr of 1.05 mg/dL). ------------------------------------------------------------------------------------------------------------------ No results  for input(s): "TSH", "T4TOTAL", "T3FREE", "THYROIDAB" in the last 72 hours.  Invalid input(s): "FREET3"  Coagulation profile No results for input(s): "INR", "PROTIME" in the last 168 hours. ------------------------------------------------------------------------------------------------------------------- No results for input(s): "DDIMER" in the last 72 hours. -------------------------------------------------------------------------------------------------------------------  Cardiac Enzymes No results for input(s): "CKMB", "TROPONINI", "MYOGLOBIN" in the last 168 hours.  Invalid input(s): "CK" ------------------------------------------------------------------------------------------------------------------    Component Value Date/Time   BNP 294.0 (H) 08/24/2022 2136     ---------------------------------------------------------------------------------------------------------------  Urinalysis    Component Value Date/Time   COLORURINE AMBER (A) 07/11/2021 2257   APPEARANCEUR HAZY (A) 07/11/2021 2257   LABSPEC 1.026 07/11/2021 2257   PHURINE 5.0 07/11/2021 2257   GLUCOSEU NEGATIVE 07/11/2021 2257   HGBUR NEGATIVE 07/11/2021 2257   BILIRUBINUR NEGATIVE 07/11/2021 2257   KETONESUR 5 (A) 07/11/2021 2257   PROTEINUR 30 (A) 07/11/2021 2257   NITRITE NEGATIVE 07/11/2021 2257   LEUKOCYTESUR TRACE (A) 07/11/2021 2257    ----------------------------------------------------------------------------------------------------------------   Imaging Results:    DG Chest Port 1 View  Result Date: 07/03/2023 CLINICAL DATA:  fall. EXAM: PORTABLE CHEST 1 VIEW COMPARISON:  06/24/2023. FINDINGS: Redemonstration of increased interstitial markings throughout bilateral lungs, which may represent underlying chronic interstitial lung disease versus emphysema. No frank pulmonary edema. No acute consolidation or lung collapse. Bilateral lateral costophrenic angles are clear. Stable cardio-mediastinal  silhouette. Prosthetic aortic valve noted. No acute osseous abnormalities. The soft tissues are within normal limits. IMPRESSION: *No acute cardiopulmonary abnormality. *Redemonstration of increased interstitial markings, which may represent chronic interstitial lung disease versus emphysema. Electronically Signed   By: Jules Schick M.D.   On: 07/03/2023 15:06   DG Hip Unilat With Pelvis 2-3 Views Left  Result Date: 07/03/2023 CLINICAL DATA:  Fall.  Left hip pain. EXAM: DG HIP (WITH OR WITHOUT PELVIS) 2-3V LEFT COMPARISON:  None Available. FINDINGS: There is mildly displaced intertrochanteric left femur fracture. No other acute fracture or dislocation. No aggressive osseous lesion. Visualized sacral arcuate lines are unremarkable. There are mild degenerative changes of bilateral  hip joints without significant joint space narrowing. Osteophytosis of the superior acetabulum. No radiopaque foreign bodies. IMPRESSION: *Mildly displaced left intertrochanteric femur fracture. Electronically Signed   By: Jules Schick M.D.   On: 07/03/2023 15:04   CT Head Wo Contrast  Result Date: 07/03/2023 CLINICAL DATA:  Fall, weakness EXAM: CT HEAD WITHOUT CONTRAST CT CERVICAL SPINE WITHOUT CONTRAST TECHNIQUE: Multidetector CT imaging of the head and cervical spine was performed following the standard protocol without intravenous contrast. Multiplanar CT image reconstructions of the cervical spine were also generated. RADIATION DOSE REDUCTION: This exam was performed according to the departmental dose-optimization program which includes automated exposure control, adjustment of the mA and/or kV according to patient size and/or use of iterative reconstruction technique. COMPARISON:  06/24/2023 FINDINGS: CT HEAD FINDINGS Brain: No evidence of acute infarction, hemorrhage, hydrocephalus, extra-axial collection or mass lesion/mass effect. Extensive periventricular and deep white matter hypodensity. Vascular: No hyperdense vessel  or unexpected calcification. Skull: Normal. Negative for fracture or focal lesion. Sinuses/Orbits: No acute finding. Other: None. CT CERVICAL SPINE FINDINGS Alignment: Normal. Skull base and vertebrae: No acute fracture. No primary bone lesion or focal pathologic process. Soft tissues and spinal canal: No prevertebral fluid or swelling. No visible canal hematoma. Disc levels: Mild-to-moderate disc space height loss and osteophytosis of the lower cervical levels, worst from C5-C7. Upper chest: Negative. Other: None. IMPRESSION: 1. No acute intracranial pathology. Advanced small-vessel white matter disease in keeping with patient age. 2. No fracture or static subluxation of the cervical spine. 3. Mild-to-moderate disc space height loss and osteophytosis of the lower cervical levels, worst from C5-C7. Electronically Signed   By: Jearld Lesch M.D.   On: 07/03/2023 14:02   CT Cervical Spine Wo Contrast  Result Date: 07/03/2023 CLINICAL DATA:  Fall, weakness EXAM: CT HEAD WITHOUT CONTRAST CT CERVICAL SPINE WITHOUT CONTRAST TECHNIQUE: Multidetector CT imaging of the head and cervical spine was performed following the standard protocol without intravenous contrast. Multiplanar CT image reconstructions of the cervical spine were also generated. RADIATION DOSE REDUCTION: This exam was performed according to the departmental dose-optimization program which includes automated exposure control, adjustment of the mA and/or kV according to patient size and/or use of iterative reconstruction technique. COMPARISON:  06/24/2023 FINDINGS: CT HEAD FINDINGS Brain: No evidence of acute infarction, hemorrhage, hydrocephalus, extra-axial collection or mass lesion/mass effect. Extensive periventricular and deep white matter hypodensity. Vascular: No hyperdense vessel or unexpected calcification. Skull: Normal. Negative for fracture or focal lesion. Sinuses/Orbits: No acute finding. Other: None. CT CERVICAL SPINE FINDINGS Alignment:  Normal. Skull base and vertebrae: No acute fracture. No primary bone lesion or focal pathologic process. Soft tissues and spinal canal: No prevertebral fluid or swelling. No visible canal hematoma. Disc levels: Mild-to-moderate disc space height loss and osteophytosis of the lower cervical levels, worst from C5-C7. Upper chest: Negative. Other: None. IMPRESSION: 1. No acute intracranial pathology. Advanced small-vessel white matter disease in keeping with patient age. 2. No fracture or static subluxation of the cervical spine. 3. Mild-to-moderate disc space height loss and osteophytosis of the lower cervical levels, worst from C5-C7. Electronically Signed   By: Jearld Lesch M.D.   On: 07/03/2023 14:02    EKG on 06/24/2023, KG this admission still pending Vent. rate 69 BPM PR interval 196 ms QRS duration 85 ms QT/QTcB 425/456 ms P-R-T axes -37 4 75 Sinus rhythm Atrial premature complexes Low voltage, precordial leads Anteroseptal infarct, old  Assessment & Plan:    Principal Problem:   Hip fracture (HCC) Active  Problems:   New onset a-fib (HCC)   Essential hypertension   S/P TAVR (transcatheter aortic valve replacement)   Severe aortic valve stenosis   Hyperlipemia   Chronic respiratory failure with hypoxia (HCC)   Left hip fracture -Secondary to mechanical fall -Patient admitted under hip fracture pathway, continue with as needed pain regimen, bowel regimen, hold on DVT prophylaxis till Ortho evaluation. -With known history of COPD, this seems to be stable at this point, no active wheezing, no dyspnea, he is on 2 L nasal cannula at home, which appears to be at baseline.  Encouraged with incentive spirometry, flutter valve during hospital stay, will need early ambulation postoperatively, will keep on scheduled DuoNebs, resume home dose budesonide. -Have discussed with daughter, she understands patient and increased risk, given his COPD, cardiac history, and frailty, he understands, and  she wants to proceed with surgery. -Orthopedic consulted, Dr. Romeo Apple to evaluate.  Falls -Will need PT-OT evaluation postoperatively, likely will need rehab placement  COPD Rennick hypoxic respiratory failure -no active wheezing, he denies any respiratory symptoms, continue with home medications, will add scheduled DuoNebs, as needed albuterol, he was encouraged to use incentive spirometer and flutter valve  History of paroxysmal A-fib -appears to be currently in normal sinus rhythm, decision has been made in the past not to proceed anticoagulation during previous hospitalization, and certainly is not a candidate given multiple falls  Dementia -Continue with Aricept  GERD -Continue with PPI  Chronic diastolic heart failure -He appears to be a euvolemic   Chronic diastolic CHF -Appears euvolemic  Leukocytosis -This is likely stress related, but will check UA given he presents with fall and generalized weakness  History of severe aortic stenosis - status post prior TAVR -Echo from 08/14/2022 shows No aortic stenosis is present. There is a 26 mm Sapien prosthetic (TAVR) valve present in the aortic position. Aortic regurgitation PHT measures 500 msec. Aortic valve mean gradient measures 9.5 mmHg.     DVT Prophylaxis Heparin  AM Labs Ordered, also please review Full Orders  Family Communication: Admission, patients condition and plan of care including tests being ordered have been discussed with the patient and and daughter at bedside who indicate understanding and agree with the plan and Code Status.  Code Status dnr, confirmed by patient and daughter  Likely DC to likely will need SNF  Condition GUARDED daily that  Consults called: Orthopedic  Admission status: Inpatient  Time spent in minutes : 70 minutes   Huey Bienenstock M.D on 07/03/2023 at 4:31 PM   [facility] - Office  [phone]

## 2023-07-03 NOTE — H&P (View-Only) (Signed)
Reason for Consult: Fracture left hip     ORTHOPAEDIC CONSULTATION  REQUESTING PHYSICIAN: Vassie Loll, MD  ASSESSMENT AND PLAN: 88 y.o. male with the following: Intertrochanteric fracture left hip  The best treatment option at this point is to perform internal fixation of the left hip with intramedullary nailing  Risk factors Age History aortic valve replacement COPD on home oxygen  I reviewed this with the family.  The best chance for recovery is with intertrochanteric nail fixation of the left hip fracture understanding the patient's age and risk factors especially his respiratory status.  He had an echocardiogram with 5560% ejection fraction no evidence of aortic stenosis and recent cardiology visit was good.  Chief complaint left hip pain  History of present illness 88 year old male smoker on oxygen although his family says he does not really use it currently on hospice secondary to the COPD and chronic oxygen therapy fell and injured his left hip complains of severe nonradiating left hip pain associated with increased pain when he tries to move it and he cannot walk  Past Medical History:  Diagnosis Date   Arthritis    Essential hypertension    Hyperlipemia    Hyperplastic colon polyp 2002   Dr. Lovell Sheehan   Pulmonary nodule    a. right lower lobe. 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 severe AS by echo in 2017 b. 01/2018: repeat echo showing EF of 55-60%, Grade 2 DD, and severe AS with mean gradient of 58 mm Hg   Vertigo    Past Surgical History:  Procedure Laterality Date   CATARACT EXTRACTION Right    COLONOSCOPY  08/17/2001   Small polyp measured 3 mm removed/multiple middle scattered  diverticula in the ascending colon-hyperplastic   COLONOSCOPY  08/14/2011   Procedure: COLONOSCOPY;  Surgeon: Corbin Ade, MD;  Location: [facility];  Service: Endoscopy;  Laterality: N/A;  10:00   EXCISIONAL HEMORRHOIDECTOMY     INTRAOPERATIVE TRANSTHORACIC ECHOCARDIOGRAM N/A 04/21/2018   Procedure: INTRAOPERATIVE TRANSTHORACIC ECHOCARDIOGRAM;  Surgeon: Kathleene Hazel, MD;  Location: [facility] OR;  Service: Open Heart Surgery;  Laterality: N/A;   RIGHT/LEFT HEART CATH AND CORONARY ANGIOGRAPHY N/A 03/25/2018   Procedure: RIGHT/LEFT HEART CATH AND CORONARY ANGIOGRAPHY;  Surgeon: Kathleene Hazel, MD;  Location: [facility] INVASIVE CV LAB;  Service: Cardiovascular;  Laterality: N/A;   TRANSCATHETER AORTIC VALVE REPLACEMENT, TRANSFEMORAL  04/21/2018   TRANSCATHETER AORTIC VALVE REPLACEMENT, TRANSFEMORAL N/A 04/21/2018   Procedure: TRANSCATHETER AORTIC VALVE REPLACEMENT, TRANSFEMORAL;  Surgeon: Kathleene Hazel, MD;  Location: [facility] OR;  Service: Open Heart Surgery;  Laterality: N/A;   Social History   Socioeconomic History   Marital status: Widowed    Spouse name: Not on file   Number of children: 2   Years of education: 40   Highest education level: 12th grade  Occupational History   Occupation: retired; Health and safety inspector  Tobacco Use   Smoking status: Every Day    Current packs/day: 1.00    Average packs/day: 1 pack/day for 65.0 years (65.0 ttl pk-yrs)    Types: Cigarettes    Passive exposure: Current   Smokeless tobacco: Never   Tobacco comments:    Verified by Daughter - Derenda Mis  Smoking Cessation Offered.  Vaping Use   Vaping status: Never Used  Substance and Sexual Activity   Alcohol use: Not Currently    Alcohol/week: 0.0 standard drinks of alcohol   Drug use: Never   Sexual activity: Not Currently    Partners: Female  Other Topics Concern   Not on file  Social History Narrative   Widower (married for 4yrs); Remarried in 2016      Social Determinants of  Health   Financial Resource Strain: Low Risk  (07/27/2022)   Overall Financial Resource Strain (CARDIA)    Difficulty of Paying Living Expenses: Not hard at all  Food Insecurity: No Food Insecurity (07/03/2023)   Hunger Vital Sign    Worried About Running Out of Food in the Last Year: Never true    Ran Out of Food in the Last Year: Never true  Transportation Needs: No Transportation Needs (07/03/2023)   PRAPARE - Administrator, Civil Service (Medical): No    Lack of Transportation (Non-Medical): No  Physical Activity: Inactive (07/27/2022)   Exercise Vital Sign    Days of Exercise per Week: 0 days    Minutes of Exercise per Session: 0 min  Stress: No Stress Concern Present (07/27/2022)   [employer] of Occupational Health - Occupational Stress Questionnaire    Feeling of Stress : Not at all  Social Connections: Moderately Isolated (07/27/2022)   Social Connection and Isolation Panel [NHANES]    Frequency of Communication with Friends and Family: More than three times a week    Frequency of Social Gatherings with Friends and Family: More than three times a week    Attends Religious Services: More than 4 times per year    Active Member of [employer] or Organizations: No    Attends Banker Meetings: Never    Marital Status: Widowed   Family History  Problem Relation Age of Onset   Stroke Mother    Heart attack Father    Liver disease Sister    Renal Disease Brother    No Known Allergies Prior to Admission medications   Medication Sig Start Date End Date Taking? Authorizing Provider  albuterol (PROVENTIL) (2.5 MG/3ML) 0.083% nebulizer solution TAKE 3 ML (2.5 MG TOTAL) BY NEBULIZATION EVERY 4 HOURS AS NEEDED FOR WHEEZING OR SHORTNESS OF BREATH 05/12/23  Yes Nyoka Cowden, MD  aspirin EC 81 MG tablet Take 1 tablet (81 mg total) by mouth daily with breakfast. 08/27/22  Yes Emokpae, Courage, MD  atorvastatin (LIPITOR) 10 MG tablet Take 10 mg by mouth daily. 11/15/15   Yes [provider]  budesonide (PULMICORT) 0.25 MG/2ML nebulizer solution One vial twice daily with albuterol 02/20/23  Yes Nyoka Cowden, MD  dextromethorphan-guaiFENesin [facility] DM) 30-600 MG 12hr tablet Take 1 tablet by mouth 2 (two) times daily.   Yes [provider]  donepezil (ARICEPT) 10 MG tablet Take 10 mg by mouth every morning. 02/11/22  Yes [provider]  famotidine (PEPCID) 20 MG tablet One after supper 03/06/21  Yes Nyoka Cowden, MD  midodrine (PROAMATINE) 10 MG tablet Take 1 tablet (10 mg total) by mouth 3 (three) times daily with meals. 08/27/22  Yes Emokpae, Courage, MD  Nebulizers (COMPRESSOR/NEBULIZER) MISC 1 Units by Does not apply route daily as needed. 08/27/22  Yes Emokpae, Courage, MD  pantoprazole (PROTONIX) 40 MG tablet TAKE 1 TABLET (40 MG TOTAL) BY MOUTH DAILY. TAKE 30-60 MIN BEFORE FIRST MEAL OF THE DAY 08/30/21  Yes Sandrea Hughs  B, MD  tamsulosin (FLOMAX) 0.4 MG CAPS capsule Take 0.8 mg by mouth daily.   Yes [provider]  vitamin B-12 (CYANOCOBALAMIN) 1000 MCG tablet Take 1,000 mcg by mouth daily.   Yes [provider]   DG Chest Port 1 View  Result Date: 07/03/2023 CLINICAL DATA:  fall. EXAM: PORTABLE CHEST 1 VIEW COMPARISON:  06/24/2023. FINDINGS: Redemonstration of increased interstitial markings throughout bilateral lungs, which may represent underlying chronic interstitial lung disease versus emphysema. No frank pulmonary edema. No acute consolidation or lung collapse. Bilateral lateral costophrenic angles are clear. Stable cardio-mediastinal silhouette. Prosthetic aortic valve noted. No acute osseous abnormalities. The soft tissues are within normal limits. IMPRESSION: *No acute cardiopulmonary abnormality. *Redemonstration of increased interstitial markings, which may represent chronic interstitial lung disease versus emphysema. Electronically Signed   By: Jules Schick M.D.   On: 07/03/2023 15:06   DG Hip Unilat  With Pelvis 2-3 Views Left  Result Date: 07/03/2023 CLINICAL DATA:  Fall.  Left hip pain. EXAM: DG HIP (WITH OR WITHOUT PELVIS) 2-3V LEFT COMPARISON:  None Available. FINDINGS: There is mildly displaced intertrochanteric left femur fracture. No other acute fracture or dislocation. No aggressive osseous lesion. Visualized sacral arcuate lines are unremarkable. There are mild degenerative changes of bilateral hip joints without significant joint space narrowing. Osteophytosis of the superior acetabulum. No radiopaque foreign bodies. IMPRESSION: *Mildly displaced left intertrochanteric femur fracture. Electronically Signed   By: Jules Schick M.D.   On: 07/03/2023 15:04   CT Head Wo Contrast  Result Date: 07/03/2023 CLINICAL DATA:  Fall, weakness EXAM: CT HEAD WITHOUT CONTRAST CT CERVICAL SPINE WITHOUT CONTRAST TECHNIQUE: Multidetector CT imaging of the head and cervical spine was performed following the standard protocol without intravenous contrast. Multiplanar CT image reconstructions of the cervical spine were also generated. RADIATION DOSE REDUCTION: This exam was performed according to the departmental dose-optimization program which includes automated exposure control, adjustment of the mA and/or kV according to patient size and/or use of iterative reconstruction technique. COMPARISON:  06/24/2023 FINDINGS: CT HEAD FINDINGS Brain: No evidence of acute infarction, hemorrhage, hydrocephalus, extra-axial collection or mass lesion/mass effect. Extensive periventricular and deep white matter hypodensity. Vascular: No hyperdense vessel or unexpected calcification. Skull: Normal. Negative for fracture or focal lesion. Sinuses/Orbits: No acute finding. Other: None. CT CERVICAL SPINE FINDINGS Alignment: Normal. Skull base and vertebrae: No acute fracture. No primary bone lesion or focal pathologic process. Soft tissues and spinal canal: No prevertebral fluid or swelling. No visible canal hematoma. Disc levels:  Mild-to-moderate disc space height loss and osteophytosis of the lower cervical levels, worst from C5-C7. Upper chest: Negative. Other: None. IMPRESSION: 1. No acute intracranial pathology. Advanced small-vessel white matter disease in keeping with patient age. 2. No fracture or static subluxation of the cervical spine. 3. Mild-to-moderate disc space height loss and osteophytosis of the lower cervical levels, worst from C5-C7. Electronically Signed   By: Jearld Lesch M.D.   On: 07/03/2023 14:02   CT Cervical Spine Wo Contrast  Result Date: 07/03/2023 CLINICAL DATA:  Fall, weakness EXAM: CT HEAD WITHOUT CONTRAST CT CERVICAL SPINE WITHOUT CONTRAST TECHNIQUE: Multidetector CT imaging of the head and cervical spine was performed following the standard protocol without intravenous contrast. Multiplanar CT image reconstructions of the cervical spine were also generated. RADIATION DOSE REDUCTION: This exam was performed according to the departmental dose-optimization program which includes automated exposure control, adjustment of the mA and/or kV according to patient size and/or use of iterative reconstruction technique. COMPARISON:  06/24/2023 FINDINGS:  CT HEAD FINDINGS Brain: No evidence of acute infarction, hemorrhage, hydrocephalus, extra-axial collection or mass lesion/mass effect. Extensive periventricular and deep white matter hypodensity. Vascular: No hyperdense vessel or unexpected calcification. Skull: Normal. Negative for fracture or focal lesion. Sinuses/Orbits: No acute finding. Other: None. CT CERVICAL SPINE FINDINGS Alignment: Normal. Skull base and vertebrae: No acute fracture. No primary bone lesion or focal pathologic process. Soft tissues and spinal canal: No prevertebral fluid or swelling. No visible canal hematoma. Disc levels: Mild-to-moderate disc space height loss and osteophytosis of the lower cervical levels, worst from C5-C7. Upper chest: Negative. Other: None. IMPRESSION: 1. No acute  intracranial pathology. Advanced small-vessel white matter disease in keeping with patient age. 2. No fracture or static subluxation of the cervical spine. 3. Mild-to-moderate disc space height loss and osteophytosis of the lower cervical levels, worst from C5-C7. Electronically Signed   By: Jearld Lesch M.D.   On: 07/03/2023 14:02   Family History Reviewed and non-contributory, no pertinent history of problems with bleeding or anesthesia    Review of Systems  Respiratory:  Positive for shortness of breath.   All other systems reviewed and are negative.      OBJECTIVE  Vitals:Patient Vitals for the past 8 hrs:  BP Temp Temp src Pulse Resp SpO2  07/04/23 0722 -- -- -- -- -- 97 %  07/04/23 0515 106/65 98.7 F (37.1 C) Oral 61 19 96 %  07/04/23 0120 99/60 98.1 F (36.7 C) Oral 67 19 98 %    Physical Exam Vitals and nursing note reviewed.  Constitutional:      General: He is not in acute distress.    Appearance: Normal appearance. He is underweight. He is not ill-appearing, toxic-appearing or diaphoretic.  HENT:     Head: Normocephalic and atraumatic.     Nose: Nose normal. No congestion or rhinorrhea.  Eyes:     General: No scleral icterus.       Right eye: No discharge.        Left eye: No discharge.     Extraocular Movements: Extraocular movements intact.     Conjunctiva/sclera: Conjunctivae normal.     Pupils: Pupils are equal, round, and reactive to light.  Cardiovascular:     Rate and Rhythm: Normal rate.     Pulses: Normal pulses.  Pulmonary:     Effort: Pulmonary effort is normal.     Breath sounds: No wheezing.  Abdominal:     General: Abdomen is flat. There is no distension.     Palpations: There is no mass.  Musculoskeletal:     Cervical back: Neck supple.  Skin:    General: Skin is warm and dry.     Capillary Refill: Capillary refill takes less than 2 seconds.     Coloration: Skin is not jaundiced.     Findings: No erythema.  Neurological:     General:  No focal deficit present.     Mental Status: He is alert and oriented to person, place, and time.  Psychiatric:        Mood and Affect: Mood normal.        Behavior: Behavior normal.        Thought Content: Thought content normal.        Judgment: Judgment normal.    UYQ:IHKVQQ findings  VZD:GLOVFI findings EPP:IRJJOA findings  CZY:SAYTKZ proximal femur, ext rot short, norm muscle tone, knee and ankle norm alignment skin normal pain with rom left hip,     Test Results Imaging  3 part IT frx with intact posteromedial buttress   Fuller Canada 07/03/2023, 5:11 PM

## 2023-07-03 NOTE — Consult Note (Signed)
Reason for Consult: Fracture left hip     ORTHOPAEDIC CONSULTATION  REQUESTING PHYSICIAN: Vassie Loll, MD  ASSESSMENT AND PLAN: 88 y.o. male with the following: Intertrochanteric fracture left hip  The best treatment option at this point is to perform internal fixation of the left hip with intramedullary nailing  Risk factors Age History aortic valve replacement COPD on home oxygen  I reviewed this with the family.  The best chance for recovery is with intertrochanteric nail fixation of the left hip fracture understanding the patient's age and risk factors especially his respiratory status.  He had an echocardiogram with 5560% ejection fraction no evidence of aortic stenosis and recent cardiology visit was good.  Chief complaint left hip pain  History of present illness 88 year old male smoker on oxygen although his family says he does not really use it currently on hospice secondary to the COPD and chronic oxygen therapy fell and injured his left hip complains of severe nonradiating left hip pain associated with increased pain when he tries to move it and he cannot walk  Past Medical History:  Diagnosis Date   Arthritis    Essential hypertension    Hyperlipemia    Hyperplastic colon polyp 2002   Dr. Lovell Sheehan   Pulmonary nodule    a. right lower lobe. 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 severe AS by echo in 2017 b. 01/2018: repeat echo showing EF of 55-60%, Grade 2 DD, and severe AS with mean gradient of 58 mm Hg   Vertigo    Past Surgical History:  Procedure Laterality Date   CATARACT EXTRACTION Right    COLONOSCOPY  08/17/2001   Small polyp measured 3 mm removed/multiple middle scattered  diverticula in the ascending colon-hyperplastic   COLONOSCOPY  08/14/2011   Procedure: COLONOSCOPY;  Surgeon: Corbin Ade, MD;  Location: [facility];  Service: Endoscopy;  Laterality: N/A;  10:00   EXCISIONAL HEMORRHOIDECTOMY     INTRAOPERATIVE TRANSTHORACIC ECHOCARDIOGRAM N/A 04/21/2018   Procedure: INTRAOPERATIVE TRANSTHORACIC ECHOCARDIOGRAM;  Surgeon: Kathleene Hazel, MD;  Location: [facility] OR;  Service: Open Heart Surgery;  Laterality: N/A;   RIGHT/LEFT HEART CATH AND CORONARY ANGIOGRAPHY N/A 03/25/2018   Procedure: RIGHT/LEFT HEART CATH AND CORONARY ANGIOGRAPHY;  Surgeon: Kathleene Hazel, MD;  Location: [facility] INVASIVE CV LAB;  Service: Cardiovascular;  Laterality: N/A;   TRANSCATHETER AORTIC VALVE REPLACEMENT, TRANSFEMORAL  04/21/2018   TRANSCATHETER AORTIC VALVE REPLACEMENT, TRANSFEMORAL N/A 04/21/2018   Procedure: TRANSCATHETER AORTIC VALVE REPLACEMENT, TRANSFEMORAL;  Surgeon: Kathleene Hazel, MD;  Location: [facility] OR;  Service: Open Heart Surgery;  Laterality: N/A;   Social History   Socioeconomic History   Marital status: Widowed    Spouse name: Not on file   Number of children: 2   Years of education: 40   Highest education level: 12th grade  Occupational History   Occupation: retired; Health and safety inspector  Tobacco Use   Smoking status: Every Day    Current packs/day: 1.00    Average packs/day: 1 pack/day for 65.0 years (65.0 ttl pk-yrs)    Types: Cigarettes    Passive exposure: Current   Smokeless tobacco: Never   Tobacco comments:    Verified by Daughter - Derenda Mis  Smoking Cessation Offered.  Vaping Use   Vaping status: Never Used  Substance and Sexual Activity   Alcohol use: Not Currently    Alcohol/week: 0.0 standard drinks of alcohol   Drug use: Never   Sexual activity: Not Currently    Partners: Female  Other Topics Concern   Not on file  Social History Narrative   Widower (married for 4yrs); Remarried in 2016      Social Determinants of  Health   Financial Resource Strain: Low Risk  (07/27/2022)   Overall Financial Resource Strain (CARDIA)    Difficulty of Paying Living Expenses: Not hard at all  Food Insecurity: No Food Insecurity (07/03/2023)   Hunger Vital Sign    Worried About Running Out of Food in the Last Year: Never true    Ran Out of Food in the Last Year: Never true  Transportation Needs: No Transportation Needs (07/03/2023)   PRAPARE - Administrator, Civil Service (Medical): No    Lack of Transportation (Non-Medical): No  Physical Activity: Inactive (07/27/2022)   Exercise Vital Sign    Days of Exercise per Week: 0 days    Minutes of Exercise per Session: 0 min  Stress: No Stress Concern Present (07/27/2022)   [employer] of Occupational Health - Occupational Stress Questionnaire    Feeling of Stress : Not at all  Social Connections: Moderately Isolated (07/27/2022)   Social Connection and Isolation Panel [NHANES]    Frequency of Communication with Friends and Family: More than three times a week    Frequency of Social Gatherings with Friends and Family: More than three times a week    Attends Religious Services: More than 4 times per year    Active Member of [employer] or Organizations: No    Attends Banker Meetings: Never    Marital Status: Widowed   Family History  Problem Relation Age of Onset   Stroke Mother    Heart attack Father    Liver disease Sister    Renal Disease Brother    No Known Allergies Prior to Admission medications   Medication Sig Start Date End Date Taking? Authorizing Provider  albuterol (PROVENTIL) (2.5 MG/3ML) 0.083% nebulizer solution TAKE 3 ML (2.5 MG TOTAL) BY NEBULIZATION EVERY 4 HOURS AS NEEDED FOR WHEEZING OR SHORTNESS OF BREATH 05/12/23  Yes Nyoka Cowden, MD  aspirin EC 81 MG tablet Take 1 tablet (81 mg total) by mouth daily with breakfast. 08/27/22  Yes Emokpae, Courage, MD  atorvastatin (LIPITOR) 10 MG tablet Take 10 mg by mouth daily. 11/15/15   Yes [provider]  budesonide (PULMICORT) 0.25 MG/2ML nebulizer solution One vial twice daily with albuterol 02/20/23  Yes Nyoka Cowden, MD  dextromethorphan-guaiFENesin [facility] DM) 30-600 MG 12hr tablet Take 1 tablet by mouth 2 (two) times daily.   Yes [provider]  donepezil (ARICEPT) 10 MG tablet Take 10 mg by mouth every morning. 02/11/22  Yes [provider]  famotidine (PEPCID) 20 MG tablet One after supper 03/06/21  Yes Nyoka Cowden, MD  midodrine (PROAMATINE) 10 MG tablet Take 1 tablet (10 mg total) by mouth 3 (three) times daily with meals. 08/27/22  Yes Emokpae, Courage, MD  Nebulizers (COMPRESSOR/NEBULIZER) MISC 1 Units by Does not apply route daily as needed. 08/27/22  Yes Emokpae, Courage, MD  pantoprazole (PROTONIX) 40 MG tablet TAKE 1 TABLET (40 MG TOTAL) BY MOUTH DAILY. TAKE 30-60 MIN BEFORE FIRST MEAL OF THE DAY 08/30/21  Yes Sandrea Hughs  B, MD  tamsulosin (FLOMAX) 0.4 MG CAPS capsule Take 0.8 mg by mouth daily.   Yes [provider]  vitamin B-12 (CYANOCOBALAMIN) 1000 MCG tablet Take 1,000 mcg by mouth daily.   Yes [provider]   DG Chest Port 1 View  Result Date: 07/03/2023 CLINICAL DATA:  fall. EXAM: PORTABLE CHEST 1 VIEW COMPARISON:  06/24/2023. FINDINGS: Redemonstration of increased interstitial markings throughout bilateral lungs, which may represent underlying chronic interstitial lung disease versus emphysema. No frank pulmonary edema. No acute consolidation or lung collapse. Bilateral lateral costophrenic angles are clear. Stable cardio-mediastinal silhouette. Prosthetic aortic valve noted. No acute osseous abnormalities. The soft tissues are within normal limits. IMPRESSION: *No acute cardiopulmonary abnormality. *Redemonstration of increased interstitial markings, which may represent chronic interstitial lung disease versus emphysema. Electronically Signed   By: Jules Schick M.D.   On: 07/03/2023 15:06   DG Hip Unilat  With Pelvis 2-3 Views Left  Result Date: 07/03/2023 CLINICAL DATA:  Fall.  Left hip pain. EXAM: DG HIP (WITH OR WITHOUT PELVIS) 2-3V LEFT COMPARISON:  None Available. FINDINGS: There is mildly displaced intertrochanteric left femur fracture. No other acute fracture or dislocation. No aggressive osseous lesion. Visualized sacral arcuate lines are unremarkable. There are mild degenerative changes of bilateral hip joints without significant joint space narrowing. Osteophytosis of the superior acetabulum. No radiopaque foreign bodies. IMPRESSION: *Mildly displaced left intertrochanteric femur fracture. Electronically Signed   By: Jules Schick M.D.   On: 07/03/2023 15:04   CT Head Wo Contrast  Result Date: 07/03/2023 CLINICAL DATA:  Fall, weakness EXAM: CT HEAD WITHOUT CONTRAST CT CERVICAL SPINE WITHOUT CONTRAST TECHNIQUE: Multidetector CT imaging of the head and cervical spine was performed following the standard protocol without intravenous contrast. Multiplanar CT image reconstructions of the cervical spine were also generated. RADIATION DOSE REDUCTION: This exam was performed according to the departmental dose-optimization program which includes automated exposure control, adjustment of the mA and/or kV according to patient size and/or use of iterative reconstruction technique. COMPARISON:  06/24/2023 FINDINGS: CT HEAD FINDINGS Brain: No evidence of acute infarction, hemorrhage, hydrocephalus, extra-axial collection or mass lesion/mass effect. Extensive periventricular and deep white matter hypodensity. Vascular: No hyperdense vessel or unexpected calcification. Skull: Normal. Negative for fracture or focal lesion. Sinuses/Orbits: No acute finding. Other: None. CT CERVICAL SPINE FINDINGS Alignment: Normal. Skull base and vertebrae: No acute fracture. No primary bone lesion or focal pathologic process. Soft tissues and spinal canal: No prevertebral fluid or swelling. No visible canal hematoma. Disc levels:  Mild-to-moderate disc space height loss and osteophytosis of the lower cervical levels, worst from C5-C7. Upper chest: Negative. Other: None. IMPRESSION: 1. No acute intracranial pathology. Advanced small-vessel white matter disease in keeping with patient age. 2. No fracture or static subluxation of the cervical spine. 3. Mild-to-moderate disc space height loss and osteophytosis of the lower cervical levels, worst from C5-C7. Electronically Signed   By: Jearld Lesch M.D.   On: 07/03/2023 14:02   CT Cervical Spine Wo Contrast  Result Date: 07/03/2023 CLINICAL DATA:  Fall, weakness EXAM: CT HEAD WITHOUT CONTRAST CT CERVICAL SPINE WITHOUT CONTRAST TECHNIQUE: Multidetector CT imaging of the head and cervical spine was performed following the standard protocol without intravenous contrast. Multiplanar CT image reconstructions of the cervical spine were also generated. RADIATION DOSE REDUCTION: This exam was performed according to the departmental dose-optimization program which includes automated exposure control, adjustment of the mA and/or kV according to patient size and/or use of iterative reconstruction technique. COMPARISON:  06/24/2023 FINDINGS:  CT HEAD FINDINGS Brain: No evidence of acute infarction, hemorrhage, hydrocephalus, extra-axial collection or mass lesion/mass effect. Extensive periventricular and deep white matter hypodensity. Vascular: No hyperdense vessel or unexpected calcification. Skull: Normal. Negative for fracture or focal lesion. Sinuses/Orbits: No acute finding. Other: None. CT CERVICAL SPINE FINDINGS Alignment: Normal. Skull base and vertebrae: No acute fracture. No primary bone lesion or focal pathologic process. Soft tissues and spinal canal: No prevertebral fluid or swelling. No visible canal hematoma. Disc levels: Mild-to-moderate disc space height loss and osteophytosis of the lower cervical levels, worst from C5-C7. Upper chest: Negative. Other: None. IMPRESSION: 1. No acute  intracranial pathology. Advanced small-vessel white matter disease in keeping with patient age. 2. No fracture or static subluxation of the cervical spine. 3. Mild-to-moderate disc space height loss and osteophytosis of the lower cervical levels, worst from C5-C7. Electronically Signed   By: Jearld Lesch M.D.   On: 07/03/2023 14:02   Family History Reviewed and non-contributory, no pertinent history of problems with bleeding or anesthesia    Review of Systems  Respiratory:  Positive for shortness of breath.   All other systems reviewed and are negative.      OBJECTIVE  Vitals:Patient Vitals for the past 8 hrs:  BP Temp Temp src Pulse Resp SpO2  07/15/2023 0722 -- -- -- -- -- 97 %  07/08/2023 0515 106/65 98.7 F (37.1 C) Oral 61 19 96 %  07/01/2023 0120 99/60 98.1 F (36.7 C) Oral 67 19 98 %    Physical Exam Vitals and nursing note reviewed.  Constitutional:      General: He is not in acute distress.    Appearance: Normal appearance. He is underweight. He is not ill-appearing, toxic-appearing or diaphoretic.  HENT:     Head: Normocephalic and atraumatic.     Nose: Nose normal. No congestion or rhinorrhea.  Eyes:     General: No scleral icterus.       Right eye: No discharge.        Left eye: No discharge.     Extraocular Movements: Extraocular movements intact.     Conjunctiva/sclera: Conjunctivae normal.     Pupils: Pupils are equal, round, and reactive to light.  Cardiovascular:     Rate and Rhythm: Normal rate.     Pulses: Normal pulses.  Pulmonary:     Effort: Pulmonary effort is normal.     Breath sounds: No wheezing.  Abdominal:     General: Abdomen is flat. There is no distension.     Palpations: There is no mass.  Musculoskeletal:     Cervical back: Neck supple.  Skin:    General: Skin is warm and dry.     Capillary Refill: Capillary refill takes less than 2 seconds.     Coloration: Skin is not jaundiced.     Findings: No erythema.  Neurological:     General:  No focal deficit present.     Mental Status: He is alert and oriented to person, place, and time.  Psychiatric:        Mood and Affect: Mood normal.        Behavior: Behavior normal.        Thought Content: Thought content normal.        Judgment: Judgment normal.    UYQ:IHKVQQ findings  VZD:GLOVFI findings EPP:IRJJOA findings  CZY:SAYTKZ proximal femur, ext rot short, norm muscle tone, knee and ankle norm alignment skin normal pain with rom left hip,     Test Results Imaging  3 part IT frx with intact posteromedial buttress   Fuller Canada 07/03/2023, 5:11 PM

## 2023-07-03 NOTE — Progress Notes (Signed)
Patient ID: Travis Murphy, male   DOB: [birth year], 88 y.o.   MRN: [id number] 88 year old male on oxygen in hospice secondary to chronic O2 therapy fell fractured left hip I spoke to 1 daughter who wants him to go ahead and have the surgery although she understands the risk that he may or may not have a good course intraoperatively or postoperatively  We may have to abort the surgery if we cannot get a good spinal anesthetic.  That will depend on anesthesia's risk assessment regarding intubation  I plan to do the surgery tomorrow at 12:30 PM

## 2023-07-04 ENCOUNTER — Other Ambulatory Visit: Payer: Self-pay

## 2023-07-04 ENCOUNTER — Encounter (HOSPITAL_COMMUNITY): Payer: Self-pay | Admitting: Internal Medicine

## 2023-07-04 ENCOUNTER — Encounter (HOSPITAL_COMMUNITY): Admission: EM | Disposition: E | Payer: Self-pay | Source: Home / Self Care | Attending: Internal Medicine

## 2023-07-04 ENCOUNTER — Inpatient Hospital Stay (HOSPITAL_COMMUNITY): Admitting: Anesthesiology

## 2023-07-04 ENCOUNTER — Inpatient Hospital Stay (HOSPITAL_COMMUNITY): Payer: Medicare Other

## 2023-07-04 DIAGNOSIS — E43 Unspecified severe protein-calorie malnutrition: Secondary | ICD-10-CM | POA: Diagnosis present

## 2023-07-04 DIAGNOSIS — S72002D Fracture of unspecified part of neck of left femur, subsequent encounter for closed fracture with routine healing: Secondary | ICD-10-CM | POA: Diagnosis not present

## 2023-07-04 DIAGNOSIS — F17210 Nicotine dependence, cigarettes, uncomplicated: Secondary | ICD-10-CM

## 2023-07-04 DIAGNOSIS — J9611 Chronic respiratory failure with hypoxia: Secondary | ICD-10-CM | POA: Diagnosis not present

## 2023-07-04 DIAGNOSIS — I10 Essential (primary) hypertension: Secondary | ICD-10-CM | POA: Diagnosis not present

## 2023-07-04 DIAGNOSIS — S72142A Displaced intertrochanteric fracture of left femur, initial encounter for closed fracture: Secondary | ICD-10-CM

## 2023-07-04 DIAGNOSIS — E782 Mixed hyperlipidemia: Secondary | ICD-10-CM

## 2023-07-04 DIAGNOSIS — Z952 Presence of prosthetic heart valve: Secondary | ICD-10-CM | POA: Diagnosis not present

## 2023-07-04 HISTORY — PX: INTRAMEDULLARY (IM) NAIL INTERTROCHANTERIC: SHX5875

## 2023-07-04 LAB — TYPE AND SCREEN
ABO/RH(D): O POS
Antibody Screen: NEGATIVE

## 2023-07-04 LAB — BASIC METABOLIC PANEL
Anion gap: 6 (ref 5–15)
BUN: 18 mg/dL (ref 8–23)
CO2: 28 mmol/L (ref 22–32)
Calcium: 7.9 mg/dL — ABNORMAL LOW (ref 8.9–10.3)
Chloride: 103 mmol/L (ref 98–111)
Creatinine, Ser: 1.03 mg/dL (ref 0.61–1.24)
GFR, Estimated: >60 mL/min (ref >60)
Glucose, Bld: 110 mg/dL — ABNORMAL HIGH (ref 70–99)
Potassium: 4 mmol/L (ref 3.5–5.1)
Sodium: 137 mmol/L (ref 135–145)

## 2023-07-04 LAB — CBC
HCT: 39.4 % (ref 39.0–52.0)
Hemoglobin: 12.5 g/dL — ABNORMAL LOW (ref 13.0–17.0)
MCH: 30.8 pg (ref 26.0–34.0)
MCHC: 31.7 g/dL (ref 30.0–36.0)
MCV: 97 fL (ref 80.0–100.0)
Platelets: 172 10*3/uL (ref 150–400)
RBC: 4.06 MIL/uL — ABNORMAL LOW (ref 4.22–5.81)
RDW: 13.7 % (ref 11.5–15.5)
WBC: 7.8 10*3/uL (ref 4.0–10.5)
nRBC: 0 % (ref 0.0–0.2)

## 2023-07-04 LAB — MAGNESIUM: Magnesium: 2.1 mg/dL (ref 1.7–2.4)

## 2023-07-04 LAB — PHOSPHORUS: Phosphorus: 3.4 mg/dL (ref 2.5–4.6)

## 2023-07-04 SURGERY — FIXATION, FRACTURE, INTERTROCHANTERIC, WITH INTRAMEDULLARY ROD
Anesthesia: Spinal | Site: Hip | Laterality: Left

## 2023-07-04 MED ORDER — ACETAMINOPHEN 325 MG PO TABS
325.0000 mg | ORAL_TABLET | Freq: Four times a day (QID) | ORAL | Status: DC | PRN
  Administered 2023-07-07 – 2023-07-11 (×2): 650 mg via ORAL
  Filled 2023-07-04 (×2): qty 2

## 2023-07-04 MED ORDER — LACTATED RINGERS IV SOLN: INTRAVENOUS | Status: DC | PRN

## 2023-07-04 MED ORDER — DOCUSATE SODIUM 100 MG PO CAPS
100.0000 mg | ORAL_CAPSULE | Freq: Two times a day (BID) | ORAL | Status: DC
  Administered 2023-07-04 – 2023-07-11 (×13): 100 mg via ORAL
  Filled 2023-07-04 (×13): qty 1

## 2023-07-04 MED ORDER — PROPOFOL 10 MG/ML IV BOLUS
INTRAVENOUS | Status: DC | PRN
  Administered 2023-07-04: 20 mg via INTRAVENOUS
  Administered 2023-07-04: 25 mg via INTRAVENOUS

## 2023-07-04 MED ORDER — BUPIVACAINE-EPINEPHRINE (PF) 0.5% -1:200000 IJ SOLN
INTRAMUSCULAR | Status: AC
Start: 2023-07-04 — End: ?
  Filled 2023-07-04: qty 30

## 2023-07-04 MED ORDER — BUPIVACAINE HCL (PF) 0.5 % IJ SOLN
INTRAMUSCULAR | Status: AC
  Filled 2023-07-04: qty 30

## 2023-07-04 MED ORDER — MENTHOL 3 MG MT LOZG: 1.0000 | LOZENGE | OROMUCOSAL | Status: DC | PRN

## 2023-07-04 MED ORDER — SODIUM CHLORIDE 0.9 % IR SOLN
Status: DC | PRN
  Administered 2023-07-04: 1000 mL

## 2023-07-04 MED ORDER — KETAMINE HCL 50 MG/5ML IJ SOSY
PREFILLED_SYRINGE | INTRAMUSCULAR | Status: AC
  Filled 2023-07-04: qty 5

## 2023-07-04 MED ORDER — MORPHINE SULFATE (PF) 2 MG/ML IV SOLN
0.5000 mg | INTRAVENOUS | Status: DC | PRN
Start: 2023-07-04 — End: 2023-07-11
  Administered 2023-07-06 – 2023-07-11 (×3): 0.5 mg via INTRAVENOUS
  Filled 2023-07-04 (×3): qty 1

## 2023-07-04 MED ORDER — METOCLOPRAMIDE HCL 5 MG/ML IJ SOLN: 5.0000 mg | Freq: Three times a day (TID) | INTRAMUSCULAR | Status: DC | PRN

## 2023-07-04 MED ORDER — SODIUM CHLORIDE 0.9 % IV BOLUS
500.0000 mL | Freq: Once | INTRAVENOUS | Status: AC
  Administered 2023-07-04: 500 mL via INTRAVENOUS

## 2023-07-04 MED ORDER — IPRATROPIUM-ALBUTEROL 0.5-2.5 (3) MG/3ML IN SOLN
RESPIRATORY_TRACT | Status: AC
  Filled 2023-07-04: qty 3

## 2023-07-04 MED ORDER — TRANEXAMIC ACID-NACL 1000-0.7 MG/100ML-% IV SOLN
INTRAVENOUS | Status: AC
  Filled 2023-07-04: qty 100

## 2023-07-04 MED ORDER — TRAMADOL HCL 50 MG PO TABS
50.0000 mg | ORAL_TABLET | Freq: Once | ORAL | Status: AC
  Administered 2023-07-08: 50 mg via ORAL

## 2023-07-04 MED ORDER — COMPRESSOR/NEBULIZER MISC
1.0000 [IU] | Freq: Every day | Status: DC | PRN
Start: 2023-07-04 — End: 2023-07-11

## 2023-07-04 MED ORDER — ONDANSETRON HCL 4 MG/2ML IJ SOLN: 4.0000 mg | Freq: Four times a day (QID) | INTRAMUSCULAR | Status: DC | PRN

## 2023-07-04 MED ORDER — CHLORHEXIDINE GLUCONATE 0.12 % MT SOLN
OROMUCOSAL | Status: AC
  Filled 2023-07-04: qty 15

## 2023-07-04 MED ORDER — DEXTROSE-SODIUM CHLORIDE 5-0.45 % IV SOLN: INTRAVENOUS | Status: DC

## 2023-07-04 MED ORDER — IPRATROPIUM-ALBUTEROL 0.5-2.5 (3) MG/3ML IN SOLN
RESPIRATORY_TRACT | Status: AC
  Administered 2023-07-04: 3 mL
  Filled 2023-07-04: qty 3

## 2023-07-04 MED ORDER — ONDANSETRON HCL 4 MG/2ML IJ SOLN: 4.0000 mg | Freq: Once | INTRAMUSCULAR | Status: DC | PRN

## 2023-07-04 MED ORDER — ASPIRIN 325 MG PO TBEC
325.0000 mg | DELAYED_RELEASE_TABLET | Freq: Every day | ORAL | Status: DC
  Administered 2023-07-05 – 2023-07-08 (×4): 325 mg via ORAL
  Filled 2023-07-04 (×4): qty 1

## 2023-07-04 MED ORDER — ONDANSETRON HCL 4 MG PO TABS
4.0000 mg | ORAL_TABLET | Freq: Four times a day (QID) | ORAL | Status: DC | PRN
Start: 2023-07-04 — End: 2023-07-11

## 2023-07-04 MED ORDER — BACITRACIN ZINC 500 UNIT/GM EX OINT
TOPICAL_OINTMENT | CUTANEOUS | Status: AC
  Filled 2023-07-04: qty 0.9

## 2023-07-04 MED ORDER — PHENYLEPHRINE HCL (PRESSORS) 10 MG/ML IV SOLN
INTRAVENOUS | Status: DC | PRN
  Administered 2023-07-04: 160 ug via INTRAVENOUS
  Administered 2023-07-04: 320 ug via INTRAVENOUS

## 2023-07-04 MED ORDER — PROPOFOL 500 MG/50ML IV EMUL
INTRAVENOUS | Status: DC | PRN
  Administered 2023-07-04: 30 ug/kg/min via INTRAVENOUS

## 2023-07-04 MED ORDER — METOCLOPRAMIDE HCL 10 MG PO TABS: 5.0000 mg | ORAL_TABLET | Freq: Three times a day (TID) | ORAL | Status: DC | PRN

## 2023-07-04 MED ORDER — FENTANYL CITRATE PF 50 MCG/ML IJ SOSY: 25.0000 ug | PREFILLED_SYRINGE | INTRAMUSCULAR | Status: DC | PRN

## 2023-07-04 MED ORDER — POLYETHYLENE GLYCOL 3350 17 G PO PACK
17.0000 g | PACK | Freq: Every day | ORAL | Status: DC
  Administered 2023-07-04 – 2023-07-09 (×5): 17 g via ORAL
  Filled 2023-07-04 (×8): qty 1

## 2023-07-04 MED ORDER — ACETAMINOPHEN 500 MG PO TABS
500.0000 mg | ORAL_TABLET | Freq: Four times a day (QID) | ORAL | Status: AC
  Administered 2023-07-04 – 2023-07-05 (×3): 500 mg via ORAL
  Filled 2023-07-04 (×4): qty 1

## 2023-07-04 MED ORDER — PHENOL 1.4 % MT LIQD: 1.0000 | OROMUCOSAL | Status: DC | PRN

## 2023-07-04 MED ORDER — FAMOTIDINE 20 MG PO TABS
20.0000 mg | ORAL_TABLET | Freq: Every day | ORAL | Status: DC
  Administered 2023-07-04 – 2023-07-11 (×8): 20 mg via ORAL
  Filled 2023-07-04 (×8): qty 1

## 2023-07-04 MED ORDER — FENTANYL CITRATE (PF) 100 MCG/2ML IJ SOLN
INTRAMUSCULAR | Status: AC
  Filled 2023-07-04: qty 2

## 2023-07-04 MED ORDER — ACETAMINOPHEN 500 MG PO TABS: 500.0000 mg | ORAL_TABLET | Freq: Once | ORAL | Status: DC

## 2023-07-04 MED ORDER — TRANEXAMIC ACID-NACL 1000-0.7 MG/100ML-% IV SOLN
1000.0000 mg | Freq: Once | INTRAVENOUS | Status: AC
  Administered 2023-07-04: 1000 mg via INTRAVENOUS
  Filled 2023-07-04: qty 100

## 2023-07-04 MED ORDER — ALBUTEROL SULFATE (2.5 MG/3ML) 0.083% IN NEBU
2.5000 mg | INHALATION_SOLUTION | RESPIRATORY_TRACT | Status: DC | PRN
  Administered 2023-07-05 – 2023-07-06 (×4): 2.5 mg via RESPIRATORY_TRACT
  Filled 2023-07-04 (×4): qty 3

## 2023-07-04 MED ORDER — CEFAZOLIN SODIUM-DEXTROSE 2-4 GM/100ML-% IV SOLN
2.0000 g | Freq: Four times a day (QID) | INTRAVENOUS | Status: AC
  Administered 2023-07-04 – 2023-07-05 (×2): 2 g via INTRAVENOUS
  Filled 2023-07-04 (×2): qty 100

## 2023-07-04 MED ORDER — EPHEDRINE SULFATE (PRESSORS) 50 MG/ML IJ SOLN
INTRAMUSCULAR | Status: DC | PRN
  Administered 2023-07-04 (×2): 5 mg via INTRAVENOUS

## 2023-07-04 MED ORDER — DM-GUAIFENESIN ER 30-600 MG PO TB12
1.0000 | ORAL_TABLET | Freq: Two times a day (BID) | ORAL | Status: DC
  Administered 2023-07-04 – 2023-07-11 (×11): 1 via ORAL
  Filled 2023-07-04 (×12): qty 1

## 2023-07-04 MED ORDER — KETAMINE HCL 10 MG/ML IJ SOLN
INTRAMUSCULAR | Status: DC | PRN
  Administered 2023-07-04: 20 mg via INTRAVENOUS
  Administered 2023-07-04: 10 mg via INTRAVENOUS

## 2023-07-04 MED ORDER — TRAMADOL HCL 50 MG PO TABS
50.0000 mg | ORAL_TABLET | Freq: Four times a day (QID) | ORAL | Status: DC
Start: 2023-07-04 — End: 2023-07-11
  Administered 2023-07-04 – 2023-07-11 (×19): 50 mg via ORAL
  Filled 2023-07-04 (×20): qty 1

## 2023-07-04 MED ORDER — CEFAZOLIN SODIUM-DEXTROSE 2-4 GM/100ML-% IV SOLN
INTRAVENOUS | Status: AC
  Filled 2023-07-04: qty 100

## 2023-07-04 MED ORDER — BUPIVACAINE-EPINEPHRINE (PF) 0.5% -1:200000 IJ SOLN
INTRAMUSCULAR | Status: DC | PRN
  Administered 2023-07-04: 30 mL via PERINEURAL

## 2023-07-04 MED ORDER — PHENYLEPHRINE HCL-NACL 20-0.9 MG/250ML-% IV SOLN
INTRAVENOUS | Status: DC | PRN
  Administered 2023-07-04: 60 ug/min via INTRAVENOUS

## 2023-07-04 MED ORDER — HYDROCODONE-ACETAMINOPHEN 5-325 MG PO TABS
1.0000 | ORAL_TABLET | ORAL | Status: DC | PRN
  Administered 2023-07-05 – 2023-07-06 (×2): 1 via ORAL
  Administered 2023-07-06: 2 via ORAL
  Administered 2023-07-07 – 2023-07-10 (×4): 1 via ORAL
  Filled 2023-07-04 (×6): qty 1
  Filled 2023-07-04: qty 2

## 2023-07-04 SURGICAL SUPPLY — 54 items
BIT DRILL AO GAMMA 4.2X300 (BIT) ×2 IMPLANT
BLADE SURG SZ10 CARB STEEL (BLADE) ×4 IMPLANT
BNDG GAUZE ELAST 4 BULKY (GAUZE/BANDAGES/DRESSINGS) ×2 IMPLANT
CHLORAPREP W/TINT 26 (MISCELLANEOUS) ×2 IMPLANT
CLOTH BEACON ORANGE TIMEOUT ST (SAFETY) ×2 IMPLANT
CLSR STERI-STRIP ANTIMIC 1/2X4 (GAUZE/BANDAGES/DRESSINGS) IMPLANT
COVER LIGHT HANDLE STERIS (MISCELLANEOUS) ×4 IMPLANT
COVER MAYO STAND XLG (MISCELLANEOUS) ×2 IMPLANT
COVER PERINEAL POST (MISCELLANEOUS) ×2 IMPLANT
DECANTER SPIKE VIAL GLASS SM (MISCELLANEOUS) ×4 IMPLANT
DRAPE STERI IOBAN 125X83 (DRAPES) ×2 IMPLANT
DRESSING MEPILEX BORDER 6X8 (GAUZE/BANDAGES/DRESSINGS) IMPLANT
DRSG MEPILEX BORDER 4X12 (GAUZE/BANDAGES/DRESSINGS) ×2 IMPLANT
DRSG MEPILEX BORDER 6X8 (GAUZE/BANDAGES/DRESSINGS) ×1 IMPLANT
DRSG MEPILEX SACRM 8.7X9.8 (GAUZE/BANDAGES/DRESSINGS) ×2 IMPLANT
DRSG PAD ABDOMINAL 8X10 ST (GAUZE/BANDAGES/DRESSINGS) ×2 IMPLANT
ELECT REM PT RETURN 9FT ADLT (ELECTROSURGICAL) ×1 IMPLANT
ELECTRODE REM PT RTRN 9FT ADLT (ELECTROSURGICAL) ×2 IMPLANT
GLOVE BIOGEL PI IND STRL 7.0 (GLOVE) ×4 IMPLANT
GLOVE SS N UNI LF 8.5 STRL (GLOVE) ×2 IMPLANT
GLOVE SURG POLYISO LF SZ8 (GLOVE) ×2 IMPLANT
GOWN STRL REUS W/TWL LRG LVL3 (GOWN DISPOSABLE) ×2 IMPLANT
GOWN STRL REUS W/TWL XL LVL3 (GOWN DISPOSABLE) ×2 IMPLANT
GUIDEROD T2 3X1000 (ROD) ×2 IMPLANT
INST SET MAJOR BONE (KITS) ×2 IMPLANT
K-WIRE 3.2X450M STR (WIRE) ×1 IMPLANT
KIT BLADEGUARD II DBL (SET/KITS/TRAYS/PACK) ×2 IMPLANT
KIT TURNOVER CYSTO (KITS) ×2 IMPLANT
KWIRE 3.2X450M STR (WIRE) ×2 IMPLANT
MANIFOLD NEPTUNE II (INSTRUMENTS) ×2 IMPLANT
MARKER SKIN DUAL TIP RULER LAB (MISCELLANEOUS) ×2 IMPLANT
NAIL TROCH GAMMA 11X18 (Nail) IMPLANT
NDL HYPO 21X1.5 SAFETY (NEEDLE) ×2 IMPLANT
NDL SPNL 18GX3.5 QUINCKE PK (NEEDLE) ×2 IMPLANT
NEEDLE HYPO 21X1.5 SAFETY (NEEDLE) ×1 IMPLANT
NEEDLE SPNL 18GX3.5 QUINCKE PK (NEEDLE) ×1 IMPLANT
NS IRRIG 1000ML POUR BTL (IV SOLUTION) ×2 IMPLANT
PACK BASIC III (CUSTOM PROCEDURE TRAY) ×1
PACK SRG BSC III STRL LF ECLPS (CUSTOM PROCEDURE TRAY) ×2 IMPLANT
PAD ARMBOARD 7.5X6 YLW CONV (MISCELLANEOUS) ×2 IMPLANT
PENCIL SMOKE EVACUATOR COATED (MISCELLANEOUS) ×2 IMPLANT
POSITIONER HEAD 8X9X4 ADT (SOFTGOODS) ×2 IMPLANT
SCREW LAG GAMMA 3 95MM (Screw) IMPLANT
SCREW LOCKING T2 F/T 5MMX40MM (Screw) IMPLANT
SET BASIN LINEN APH (SET/KITS/TRAYS/PACK) ×2 IMPLANT
SPONGE T-LAP 18X18 ~~LOC~~+RFID (SPONGE) ×4 IMPLANT
STRIP CLOSURE SKIN 1/2X4 (GAUZE/BANDAGES/DRESSINGS) IMPLANT
SUT BRALON NAB BRD #1 30IN (SUTURE) ×2 IMPLANT
SUT MNCRL 0 VIOLET CTX 36 (SUTURE) ×2 IMPLANT
SUT MON AB 2-0 CT1 36 (SUTURE) ×2 IMPLANT
SYR 30ML LL (SYRINGE) ×2 IMPLANT
SYR BULB IRRIG 60ML STRL (SYRINGE) ×4 IMPLANT
TRAY FOLEY MTR SLVR 16FR STAT (SET/KITS/TRAYS/PACK) ×2 IMPLANT
YANKAUER SUCT BULB TIP NO VENT (SUCTIONS) ×2 IMPLANT

## 2023-07-04 NOTE — Progress Notes (Signed)
Dr. Romeo Apple updated on pt's status throughou the night to include low BP and urinary retention. Pt's daughter at bedside. Pt noted to be voiding during shift change with approximately 100 ml of amber clear urine in external catheter container.

## 2023-07-04 NOTE — Interval H&P Note (Signed)
History and Physical Interval Note:  07/04/2023 1:30 PM  Travis Murphy  has presented today for surgery, with the diagnosis of left hip fracture.  The various methods of treatment have been discussed with the patient and family. After consideration of risks, benefits and other options for treatment, the patient has consented to  Procedure(s) with comments: INTRAMEDULLARY (IM) NAIL INTERTROCHANTERIC (Left) - gamma stryker nail as a surgical intervention.  The patient's history has been reviewed, patient examined, no change in status, stable for surgery.  I have reviewed the patient's chart and labs.  Questions were answered to the patient's satisfaction.     Fuller Canada

## 2023-07-04 NOTE — Care Management Important Message (Signed)
Important Message  Patient Details  Name: Travis Murphy MRN: [id number] Date of Birth: 03-Apr-1935   Important Message Given:  Yes - Medicare IM     Corey Harold 07/04/2023, 11:43 AM

## 2023-07-04 NOTE — Progress Notes (Signed)
Progress Note   Patient: Travis Murphy UJW:[id number] DOB: 08-11-1935 DOA: 07/03/2023     1 DOS: the patient was seen and examined on 07/04/2023   Brief hospital admission course: As per H&P written by Dr. Randol Kern on 07/03/2023 Travis Murphy  is a 88 y.o. male, past medical history of COPD, on 2 L nasal cannula at home intermittently, severe aortic stenosis status post TAVR, hypertension, dementia. -Presents to ED secondary to fall, and left hip pain, patient was walking out of convenience store today, when he fell, he denies feeling weak, dizzy, no loss of consciousness, he did report he did hit his head, but he is not on any blood thinners, he was able to stand with assistance from bystanders, and he was put into his daughter's car, who was able to get him home, but he was in too much pain, to get out of car, so she brought him to ED, patient had another fall recently, last week, but he denies any trauma.  Patient is on hospice for COPD -ED his workup significant for mild leukocytosis at 13.9, he was afebrile, his chest x-ray significant for chronic COPD, his left hip x-ray significant for mildly displaced left intertrochanteric fracture, ED physician discussed with Dr. Romeo Apple who requested admission.   Assessment and Plan: 1-left hip fracture -Secondary to mechanical fall. -Pending evaluation by orthopedic service with anticipated surgical intervention later today -Continue as needed analgesia -Physical therapy evaluation once surgical intervention completed. -Recommending a spinal blockade given underlying history of chronic pulmonary disease and increased risk. -Patient will be kept NPO.  2-COPD/chronic respiratory failure with hypoxia -No active wheezing -Order movement bilaterally -Continue home oxygen supplementation -Continue home bronchodilator management and follow-up response.  3-dementia -Continue Aricept without  4-history of paroxysmal atrial  fibrillation -Appears to be stable and in sinus rhythm at the moment -Given increased risk for falls and dementia no anticoagulation chronically. -Follow electrolytes and telemetry monitoring.  5-gastroesophageal flux disease -Continue PPI.  6-chronic diastolic heart failure -Euvolemic and compensated -Continue to follow daily weights/I's and O's. -Low-sodium diet discussed with patient.  7-history of severe aortic stenosis -Status post TAVR -Appears to be stable and well compensated with repeat echo in December 2023 demonstrating no presence of aortic stenosis after surgical intervention. -Patient aortic valve mean gradient measures 9.5 mmHg -Continue patient follow-up with cardiology service.  8-leukocytosis -Stress demargination most likely -No acute signs of acute infection -Follow WBC trend.   Subjective:  No chest pain, no nausea, no vomiting.  Good saturation on chronic supplementation.  Expressing pain in his left hip with any movement.  No acute distress.  Physical Exam: Vitals:   07/04/23 1645 07/04/23 1654 07/04/23 1658 07/04/23 1700  BP: 129/76  106/75 106/75  Pulse: (!) 125 91 92 91  Resp: (!) 27 (!) 22 (!) 21 19  Temp:   (!) 97.5 F (36.4 C)   TempSrc:      SpO2: (!) 89% 93% 92% 96%  Weight:      Height:       General exam: Alert, awake, follow commands appropriate; in no acute distress. Respiratory system: Positive scattered rhonchi; good air movement bilaterally.  No wheezing.  Good saturation on chronic supplementation. Cardiovascular system: Rate controlled and sinus rhythm; no rubs or gallops. Gastrointestinal system: Abdomen is nondistended, soft and nontender. No organomegaly or masses felt. Normal bowel sounds heard. Central nervous system: No focal neurological deficits. Extremities: No cyanosis, clubbing or edema.  Decreased range of motion in his left lower  extremity secondary to pain. Skin: No petechiae. Psychiatry: Mood & affect appropriate.    Data Reviewed: CBC: WBC 7.8, hemoglobin 12.5, platelet count 172K Basic metabolic panel: Sodium 137, potassium 4.0, chloride 103, bicarb 28, BUN 18, creatinine 1.03 and GFR > 60  Family Communication: Daughter is at bedside.  Disposition: Status is: Inpatient Remains inpatient appropriate because: Continue supportive care, as needed analgesics and surgical repair of hip fracture.   Planned Discharge Destination: To be determined; will follow recommendation by PT after surgical repair.  Time spent: 40 minutes  Author: Vassie Loll, MD 07/04/2023 5:16 PM  For on call review [email or web address].

## 2023-07-04 NOTE — OR Nursing (Signed)
Congestive cough,  Reported to Dr Johnnette Litter.

## 2023-07-04 NOTE — Anesthesia Preprocedure Evaluation (Signed)
Anesthesia Evaluation  Patient identified by MRN, date of birth, ID band Patient awake    Reviewed: Allergy & Precautions, H&P , NPO status , Patient's Chart, lab work & pertinent test results, reviewed documented beta blocker date and time   Airway Mallampati: II  TM Distance: >3 FB Neck ROM: full    Dental no notable dental hx.    Pulmonary neg pulmonary ROS, pneumonia, COPD, Current Smoker   Pulmonary exam normal breath sounds clear to auscultation       Cardiovascular Exercise Tolerance: Good hypertension, negative cardio ROS  Rhythm:regular Rate:Normal     Neuro/Psych negative neurological ROS  negative psych ROS   GI/Hepatic negative GI ROS, Neg liver ROS,,,  Endo/Other  negative endocrine ROS    Renal/GU negative Renal ROS  negative genitourinary   Musculoskeletal   Abdominal   Peds  Hematology negative hematology ROS (+)   Anesthesia Other Findings   Reproductive/Obstetrics negative OB ROS                             Anesthesia Physical Anesthesia Plan  ASA: 3 and emergent  Anesthesia Plan: Spinal   Post-op Pain Management:    Induction:   PONV Risk Score and Plan: Propofol infusion  Airway Management Planned:   Additional Equipment:   Intra-op Plan:   Post-operative Plan:   Informed Consent: I have reviewed the patients History and Physical, chart, labs and discussed the procedure including the risks, benefits and alternatives for the proposed anesthesia with the patient or authorized representative who has indicated his/her understanding and acceptance.     Dental Advisory Given  Plan Discussed with: CRNA  Anesthesia Plan Comments:        Anesthesia Quick Evaluation

## 2023-07-04 NOTE — Op Note (Signed)
Open treatment internal fixation of the hip with CEPHALOMEDULLARY nail  Implant Name Type Inv. Item Serial No. Manufacturer Lot No. LRB No. Used Action  NAIL [facility] GAMMA 11X18 - [id number] Nail NAIL TROCH GAMMA 11X18  [employer] [id number] Left 1 Implanted  SCREW LAG GAMMA 3 - [id number] Screw SCREW LAG GAMMA 3  [employer] [id number] Left 1 Implanted  SCREW LOCKING T2 F/T 5MMX40MM - [id number] Screw SCREW LOCKING T2 F/T 5MMX40MM  [employer] [id number] Left 1 Implanted     Size 125 short nail with 95 lag screw   07/04/2023 4:02 PM  Transexamic acid was used  Preop diagnosis left  hip fracture intertrochanteric  Postoperative diagnosis same Procedure open treatment internal fixation of the  left  hip Surgeon Romeo Apple Assistants none Anesthesia spinal Surgical findings 3 part stable  INTERTROCHANTERIC HIP FRACTURE   BLOOD ADMINISTERED:none  DRAINS: none   LOCAL MEDICATIONS USED:  marcaine    SPECIMEN:  No Specimen  DISPOSITION OF SPECIMEN:  N/A  COUNTS:  CORRECT   DICTATION: .Dragon Dictation   Surgeon Romeo Apple  The patient was taken to the recovery room in stable condition  PLAN OF CARE: Discharge to home after PACU  PATIENT DISPOSITION:  PACU - hemodynamically stable.   Delay start of Pharmacological VTE agent (>24hrs) due to surgical blood loss or risk of bleeding: yes     The surgery was done as follows: The patient was identified in the preop area the surgical site (left hip ) was confirmed and marked after thorough chart review including radiographs; implants were checked personally.  The patient was brought back to the operating room for spinal anesthesia   It was a difficult but successful spinal anesthetic   At that point a Foley catheter was attempted to be placed and the nurses were unsuccessful.  I tried with several sized catheters and was also unsuccessful.  Dr. Lovell Sheehan also tried and was unsuccessful.  He and I together  tried to use hemostats and mosquitoes to find the meatus of the penis and it was unknown successful.  At that point we inquired as to the location of the urologist but he was in [place]  At that point I made a decision to proceed with the surgery to repair of the hip  The patient was placed on the fracture table. The operative leg was placed in traction the nonoperative leg was padded and placed in a boot and abducted  The C-arm was brought in and a closed reduction was performed with the C-arm.  The reduction was obtained with straight traction and then internal rotation Multiplanar x-rays were taken to confirm the reduction   The left leg was prepped and draped with sterile technique. This was followed by timeout which confirmed as surgical site, implants, x-ray gowns and badges.  The incision was made over the left hip at the proximal to the greater trochanter, subcutaneous tissue was divided down to the fascial layer which was split in line with the skin and incision.  This was followed by blunt dissection with a finger down to the tip of the trochanter  The sharp tipped awl was passed down to the level lesser TROCHANTER  followed by insertion of a guidewire down to the knee.  X-ray confirmed position of the guidewire and proximal reaming was performed down to the level of the lesser trochanter.  We then passed a Stryker gamma short 125 degree nail  A second incision was made distal to the initial incision  and the subcutaneous tissue was divided, muscle and fascia were split down to bone   After correction for rotation (a perforating drill was used to breech the cortex of the lateral femur followed by) insertion of a threaded guidewire which was placed in the center of the femoral head on AP and lateral x-ray  We measured the pin distance at 93 and then set the reamer to the same number  (9 5)  followed by reaming over the guidewire.  The lag screw was then placed over the guidewire and the  guidewire was removed.   We next use the external guide to place a third incision and then passed a cannula with a sharp tip followed by drilling with appropriate drill bit and measuring off the drill bit shows a 40 mm screw   Final images confirmed reduction of the fracture and hardware position.  The wound was irrigated with copious amounts of saline and closed in layered fashion starting with 0 Monocryl, in 2 layers and 0 Monocryl followed by Steri-Strips  We used 30 ml of Marcaine with epinephrine dividing it between each layer  A sterile bandage was applied  Postoperative plan Weightbearing as tolerated Staples if present can be removed postop day 12-14 Anticoagulation for 28 days Follow-up visit at 4 weeks for x-rays and then x-rays at 6 weeks and 12 weeks [id number]  SURGEON:  Surgeon(s) and Role:    Vickki Hearing, MD - Primary

## 2023-07-04 NOTE — Brief Op Note (Signed)
07/04/2023  4:01 PM  PATIENT:  Kurtis Bushman  88 y.o. male  PRE-OPERATIVE DIAGNOSIS:  left hip fracture  POST-OPERATIVE DIAGNOSIS:  left hip fracture  PROCEDURE:  Procedure(s) with comments: INTRAMEDULLARY (IM) NAIL INTERTROCHANTERIC (Left) - gamma stryker nail  SURGEON:  Surgeons and Role:    * Vickki Hearing, MD - Primary  PHYSICIAN ASSISTANT:   ASSISTANTS: none   ANESTHESIA:   spinal  EBL:  50 mL   BLOOD ADMINISTERED:none  DRAINS: none   LOCAL MEDICATIONS USED:  MARCAINE     SPECIMEN:  No Specimen  DISPOSITION OF SPECIMEN:  N/A  COUNTS:  YES  TOURNIQUET:  * No tourniquets in log *  DICTATION: .Dragon Dictation  PLAN OF CARE: Admit to inpatient   PATIENT DISPOSITION:  PACU - hemodynamically stable.   Delay start of Pharmacological VTE agent (>24hrs) due to surgical blood loss or risk of bleeding: yes

## 2023-07-04 NOTE — Transfer of Care (Signed)
Immediate Anesthesia Transfer of Care Note  Patient: Travis Murphy  Procedure(s) Performed: INTRAMEDULLARY (IM) NAIL INTERTROCHANTERIC (Left: Hip)  Patient Location: PACU  Anesthesia Type:MAC and Spinal  Level of Consciousness: awake, alert , and patient cooperative  Airway & Oxygen Therapy: Patient Spontanous Breathing and Patient connected to face mask oxygen  Post-op Assessment: Report given to RN and Post -op Vital signs reviewed and stable  Post vital signs: Reviewed and stable  Last Vitals:  Vitals Value Taken Time  BP 94/59 07/04/23 1549  Temp    Pulse 95 07/04/23 1558  Resp 26 07/04/23 1558  SpO2 96 % 07/04/23 1558  Vitals shown include unfiled device data.  Last Pain:  Vitals:   07/04/23 0950  TempSrc:   PainSc: 0-No pain      Patients Stated Pain Goal: 0 (07/03/23 2008)  Complications: No notable events documented.

## 2023-07-04 NOTE — TOC Progression Note (Signed)
Transition of Care [facility]) - Progression Note    Patient Details  Name: Travis Murphy MRN: [id number] Date of Birth: [birth year]  [facility] CM/SW Contact  Geni Bers, RN Phone Number: 07/04/2023, 10:48 AM  Clinical Narrative:     Chart reviewed. Pt live alone. Plan for surgery today. TOC will continue to follow for disposition and discharge needs.        Expected Discharge Plan and Services       Living arrangements for the past 2 months: Single Family Home                                       Social Determinants of Health (SDOH) Interventions SDOH Screenings   Food Insecurity: No Food Insecurity (07/03/2023)  Housing: Low Risk  (07/03/2023)  Transportation Needs: No Transportation Needs (07/03/2023)  Utilities: Not At Risk (07/03/2023)  Alcohol Screen: Low Risk  (07/27/2022)  Depression (PHQ2-9): Low Risk  (07/27/2022)  Financial Resource Strain: Low Risk  (07/27/2022)  Physical Activity: Inactive (07/27/2022)  Social Connections: Moderately Isolated (07/27/2022)  Stress: No Stress Concern Present (07/27/2022)  Tobacco Use: High Risk (07/03/2023)    Readmission Risk Interventions     No data to display

## 2023-07-04 NOTE — OR Nursing (Signed)
No pain

## 2023-07-04 NOTE — Progress Notes (Signed)
Respiratory Therapy came to see pt.  RT placed high flow oxygen nasal cannula at 10 L/min. Placed neonatal-adult SpO2 sensor on L ear, O2 sat at 100%. RT titrated high flow to 4L/min, O2 sat 98%. Pt tolerated well.

## 2023-07-04 NOTE — Progress Notes (Signed)
Initial Nutrition Assessment  DOCUMENTATION CODES:   Severe malnutrition in context of chronic illness  INTERVENTION:   When diet advanced after surgery today, add: [employer] Breakfast Essentials TID, each packet mixed with 8 ounces of whole milk provides 13 grams of protein and 290 calories. MVI with minerals daily. Magic cup TID with meals, each supplement provides 290 kcal and 9 grams of protein.  NUTRITION DIAGNOSIS:   Severe Malnutrition related to chronic illness (COPD) as evidenced by severe muscle depletion, severe fat depletion.  GOAL:   Patient will meet greater than or equal to 90% of their needs  MONITOR:   PO intake, Supplement acceptance  REASON FOR ASSESSMENT:   Consult Hip fracture protocol  ASSESSMENT:   88 yo male admitted with L hip fracture. PMH includes COPD on 2 L oxygen at baseline, HTN, HLD, severe AS, vertigo, TAVR, arthritis, pulmonary nodule.  Patient currently in the OR for IM nail. RD spoke with patient and his wife and 2 daughters before he went to surgery. He has gradually lost weight over several years. No recent change in weight or appetite / intake at home. He eats very well, but just keeps gradually losing weight. He likes to eat at [facility] for breakfast. Family has offered patient many supplements at home and he does not like any that he has tried, including Ensure and Boost. He agreed to try [employer] essentials and magic cups with meals.  Weight history reviewed. Patient with 6% weight loss over the past 10 months, which is not significant for the time frame. Patient meets criteria for severe malnutrition, given severe depletion of muscle and subcutaneous fat mass.  Labs reviewed.  Medications reviewed and include vitamin B12, protonix, flomax.  NUTRITION - FOCUSED PHYSICAL EXAM:  Flowsheet Row Most Recent Value  Orbital Region Severe depletion  Upper Arm Region Severe depletion  Thoracic and Lumbar Region Severe  depletion  Buccal Region Severe depletion  Temple Region Severe depletion  Clavicle Bone Region Severe depletion  Clavicle and Acromion Bone Region Severe depletion  Scapular Bone Region Severe depletion  Dorsal Hand Severe depletion  Patellar Region Severe depletion  Anterior Thigh Region Severe depletion  Posterior Calf Region Severe depletion  Edema (RD Assessment) None  Hair Reviewed  Eyes Reviewed  Mouth Reviewed  Skin Reviewed  Nails Reviewed       Diet Order:   Diet Order             Diet NPO time specified Except for: Sips with Meds  Diet effective midnight                   EDUCATION NEEDS:   Education needs have been addressed  Skin:  Skin Assessment: Reviewed RN Assessment  Last BM:  11/8  Height:   Ht Readings from Last 1 Encounters:  07/03/23 5\' 6"  (1.676 m)    Weight:   Wt Readings from Last 1 Encounters:  07/03/23 54.4 kg    Ideal Body Weight:  64.5 kg  BMI:  Body mass index is 19.37 kg/m.  Estimated Nutritional Needs:   Kcal:  1800-2000  Protein:  80-90 gm  Fluid:  1.8-2 L   Gabriel Rainwater RD, LDN, CNSC Please refer to Amion for contact information.

## 2023-07-05 DIAGNOSIS — S72002D Fracture of unspecified part of neck of left femur, subsequent encounter for closed fracture with routine healing: Secondary | ICD-10-CM | POA: Diagnosis not present

## 2023-07-05 DIAGNOSIS — I10 Essential (primary) hypertension: Secondary | ICD-10-CM | POA: Diagnosis not present

## 2023-07-05 DIAGNOSIS — Z952 Presence of prosthetic heart valve: Secondary | ICD-10-CM | POA: Diagnosis not present

## 2023-07-05 DIAGNOSIS — J9611 Chronic respiratory failure with hypoxia: Secondary | ICD-10-CM | POA: Diagnosis not present

## 2023-07-05 LAB — CBC
HCT: 34.5 % — ABNORMAL LOW (ref 39.0–52.0)
Hemoglobin: 11.1 g/dL — ABNORMAL LOW (ref 13.0–17.0)
MCH: 31.4 pg (ref 26.0–34.0)
MCHC: 32.2 g/dL (ref 30.0–36.0)
MCV: 97.7 fL (ref 80.0–100.0)
Platelets: 162 10*3/uL (ref 150–400)
RBC: 3.53 MIL/uL — ABNORMAL LOW (ref 4.22–5.81)
RDW: 13.6 % (ref 11.5–15.5)
WBC: 12.8 10*3/uL — ABNORMAL HIGH (ref 4.0–10.5)
nRBC: 0 % (ref 0.0–0.2)

## 2023-07-05 NOTE — Plan of Care (Signed)
Pt stated he was not in pain during nursing shift. Neurovascular checks of pulses moderate. Pt attempted to get out of bed on his own to utilize the restroom. Informed pt a bed pan was available. Pt was not happy but complied without further incident.

## 2023-07-05 NOTE — Progress Notes (Signed)
Progress Note   Patient: Travis Murphy ZOX:[id number] DOB: 05-19-1935 DOA: 07/03/2023     2 DOS: the patient was seen and examined on 07/05/2023   Brief hospital admission course: As per H&P written by Dr. Randol Kern on 07/03/2023 Travis Murphy  is a 88 y.o. male, past medical history of COPD, on 2 L nasal cannula at home intermittently, severe aortic stenosis status post TAVR, hypertension, dementia. -Presents to ED secondary to fall, and left hip pain, patient was walking out of convenience store today, when he fell, he denies feeling weak, dizzy, no loss of consciousness, he did report he did hit his head, but he is not on any blood thinners, he was able to stand with assistance from bystanders, and he was put into his daughter's car, who was able to get him home, but he was in too much pain, to get out of car, so she brought him to ED, patient had another fall recently, last week, but he denies any trauma.  Patient is on hospice for COPD -ED his workup significant for mild leukocytosis at 13.9, he was afebrile, his chest x-ray significant for chronic COPD, his left hip x-ray significant for mildly displaced left intertrochanteric fracture, ED physician discussed with Dr. Romeo Apple who requested admission.   Assessment and Plan: 1-left hip fracture -Secondary to mechanical fall. -Pending evaluation by orthopedic service with anticipated surgical intervention later today -Continue as needed analgesia -Status post intramedullary nail to left hip to stabilize fracture without significant complications. -Following postoperative care by orthopedic service okay to weight-bear as tolerated; full dose aspirin on daily basis for DVT prophylaxis and keep staples and dressings in place for 14 days. -They will follow patient up after discharge in the next 2 weeks. -Physical therapy has seen patient and is recommending skilled nursing facility for rehabilitation and conditioning.  2-COPD/chronic  respiratory failure with hypoxia -No active wheezing; good saturation on chronic supplementation appreciated. -Good air movement bilaterally -Continue home bronchodilator management and follow-up response.  3-dementia -Continue Aricept  -No behavioral disturbances currently appreciated. -Continue constant reorientation.    4-history of paroxysmal atrial fibrillation -Appears to be stable and in sinus rhythm at the moment -Given increased risk for falls and dementia no anticoagulation chronically. -Follow electrolytes and telemetry monitoring for another 24 hours; if stable will discontinue telemetry. -Continue patient follow-up with cardiology service..  5-gastroesophageal flux disease -Continue PPI.  6-chronic diastolic heart failure -Euvolemic, stable and compensated -Continue to follow daily weights/I's and O's. -Low-sodium diet discussed with patient.  7-history of severe aortic stenosis -Status post TAVR -Appears to be stable and well compensated with repeat echo in December 2023 demonstrating no presence of aortic stenosis after surgical intervention. -Patient aortic valve mean gradient measures 9.5 mmHg -Continue patient follow-up with cardiology service.  8-leukocytosis -Stress demargination most likely -No acute signs of acute infection -Continue to follow WBC trend.   Subjective:  Reporting pain in his left leg; but overall current analgesics help in controlling discomfort.  No chest pain, no nausea, no vomiting, good saturation on chronic supplementation.  Physical Exam: Vitals:   07/05/23 0444 07/05/23 0734 07/05/23 1528 07/05/23 1601  BP: 94/67  128/65 (!) 85/57  Pulse: 79  67 74  Resp: 20   20  Temp: 97.7 F (36.5 C)  98.2 F (36.8 C) 98.3 F (36.8 C)  TempSrc: Oral  Oral Oral  SpO2: 97% 97% 95% 96%  Weight:      Height:       General exam: Alert,  awake, oriented x 3; no chest pain, no nausea, no vomiting. Respiratory system: Improved air movement  bilaterally; positive scattered rhonchi.  No significant wheezing or crackles. Cardiovascular system: Rate controlled, no rubs, no gallops, no JVD. Gastrointestinal system: Abdomen is nondistended, soft and nontender. No organomegaly or masses felt. Normal bowel sounds heard. Central nervous system: No focal neurological deficits. Extremities: No cyanosis or clubbing; no edema.  Still reporting some pain in his left leg but overall in no acute distress and expressing good control with current analgesics. Skin: No petechiae. Psychiatry: Mood and affect appropriate  Data Reviewed: CBC: WBCs 12.8, hemoglobin 11.1 platelet count 162K. Basic metabolic panel: Sodium 137, potassium 4.0, chloride 103, bicarb 28, BUN 18, creatinine 1.03 and GFR > 60  Family Communication: Daughter is at bedside.  Disposition: Status is: Inpatient Remains inpatient appropriate because: Status post intramedullary nail intertrochanteric on 07/05/2023; continue postoperative care and follow-up PT evaluation following a skilled nursing facility for rehabilitation.   Planned Discharge Destination: Patient seen by physical therapy with recommendations for skilled nursing facility to pursued rehab/conditioning.  Time spent: 40 minutes  Author: Vassie Loll, MD 07/05/2023 5:05 PM  For on call review [email or web address].

## 2023-07-05 NOTE — Progress Notes (Signed)
Subjective: 1 Day Post-Op Procedure(s) (LRB): INTRAMEDULLARY (IM) NAIL INTERTROCHANTERIC (Left) Patient reports pain as 0 on 0-10 scale.    Objective: Vital signs in last 24 hours:  BP 94/67 (BP Location: Right Arm)   Pulse 79   Temp 97.7 F (36.5 C) (Oral)   Resp 20   Ht 5\' 6"  (1.676 m)   Wt 54.4 kg   SpO2 97%   BMI 19.37 kg/m    Intake/Output from previous day: 11/08 0701 - 11/09 0700 In: 840 [P.O.:240; I.V.:400; IV Piggyback:200] Out: 250 [Urine:200; Blood:50]  Intake/Output this shift: No intake/output data recorded.  Recent Labs    07/03/23 1229 07/04/23 0620 07/05/23 0340  HGB 12.9* 12.5* 11.1*   Recent Labs    07/04/23 0620 07/05/23 0340  WBC 7.8 12.8*  RBC 4.06* 3.53*  HCT 39.4 34.5*  PLT 172 162   Recent Labs    07/03/23 1229 07/04/23 0620  NA 137 137  K 4.1 4.0  CL 102 103  CO2 27 28  BUN 17 18  CREATININE 1.05 1.03  GLUCOSE 123* 110*  CALCIUM 8.2* 7.9*   No results for input(s): "LABPT", "INR" in the last 72 hours.  Incision: dressing C/D/I   Assessment/Plan:  He looks very good Mental status normal  Respiratory status baseline Urinating fine without penile irritation -hopefully Urology can give input ref to inability to find meatus    1 Day Post-Op Procedure(s) (LRB): INTRAMEDULLARY (IM) NAIL INTERTROCHANTERIC (Left) Advance diet Up with therapy D/C IV fluids   Fuller Canada 07/05/2023, 7:51 AM

## 2023-07-05 NOTE — Plan of Care (Signed)
  Problem: Acute Rehab PT Goals(only PT should resolve) Goal: Pt Will Go Supine/Side To Sit Outcome: Progressing Flowsheets (Taken 07/05/2023 1240) Pt will go Supine/Side to Sit:  with minimal assist  with moderate assist Goal: Patient Will Transfer Sit To/From Stand Outcome: Progressing Flowsheets (Taken 07/05/2023 1240) Patient will transfer sit to/from stand:  with minimal assist  with moderate assist Goal: Pt Will Transfer Bed To Chair/Chair To Bed Outcome: Progressing Flowsheets (Taken 07/05/2023 1240) Pt will Transfer Bed to Chair/Chair to Bed:  with min assist  with mod assist Goal: Pt Will Ambulate Outcome: Progressing Flowsheets (Taken 07/05/2023 1240) Pt will Ambulate:  25 feet  with minimal assist  with moderate assist  with rolling walker   12:40 PM, 07/05/23 [name], MPT Physical Therapist with [facility] [phone] office [phone] mobile phone

## 2023-07-05 NOTE — Evaluation (Signed)
Physical Therapy Evaluation Patient Details Name: Travis Murphy MRN: [id number] DOB: 1935-05-25 Today's Date: 07/05/2023  History of Present Illness  Travis Murphy  is a 88 y.o. male s/p ORIF Left hip fracture on 07/04/23, past medical history of COPD, on 2 L nasal cannula at home intermittently, severe aortic stenosis status post TAVR, hypertension, dementia.  -Presents to ED secondary to fall, and left hip pain, patient was walking out of convenience store today, when he fell, he denies feeling weak, dizzy, no loss of consciousness, he did report he did hit his head, but he is not on any blood thinners, he was able to stand with assistance from bystanders, and he was put into his daughter's car, who was able to get him home, but he was in too much pain, to get out of car, so she brought him to ED, patient had another fall recently, last week, but he denies any trauma.  Patient is on hospice for COPD   Clinical Impression  Patient demonstrates slow labored movement for sitting up at bedside with most difficulty moving LLE due to increased pain, once seated had frequent leaning falling backwards requiring repeated verbal/tactile cueing to maintain sitting balance, very unsteady on feet with frequent buckling of LLE and limited to a few side steps at beside before having to sit due to fall risk.  Patient tolerated sitting up in chair after therapy with his SpO2 at 89-91% while on 3 LPM, desaturated to 74% when taking steps with RW and had to be put back on supplemental O2 - nurse notified.  Patient will benefit from continued skilled physical therapy in hospital and recommended venue below to increase strength, balance, endurance for safe ADLs and gait.           If plan is discharge home, recommend the following: A lot of help with bathing/dressing/bathroom;A lot of help with walking and/or transfers;Help with stairs or ramp for entrance;Assistance with cooking/housework   Can travel by  private vehicle   No    Equipment Recommendations None recommended by PT  Recommendations for Other Services       Functional Status Assessment Patient has had a recent decline in their functional status and demonstrates the ability to make significant improvements in function in a reasonable and predictable amount of time.     Precautions / Restrictions Precautions Precautions: Fall Restrictions Weight Bearing Restrictions: Yes LLE Weight Bearing: Weight bearing as tolerated      Mobility  Bed Mobility Overal bed mobility: Needs Assistance Bed Mobility: Supine to Sit     Supine to sit: Mod assist, Max assist     General bed mobility comments: increased time, labored movement with c/o severe pain left hip    Transfers Overall transfer level: Needs assistance Equipment used: Rolling walker (2 wheels) Transfers: Sit to/from Stand, Bed to chair/wheelchair/BSC Sit to Stand: Mod assist   Step pivot transfers: Mod assist       General transfer comment: unsteady labored movement with difficulty moving LLE due to increased pain/weakness    Ambulation/Gait Ambulation/Gait assistance: Max assist, Mod assist Gait Distance (Feet): 5 Feet Assistive device: Rolling walker (2 wheels) Gait Pattern/deviations: Decreased step length - right, Decreased step length - left, Decreased stride length, Antalgic, Trunk flexed, Knees buckling Gait velocity: slow     General Gait Details: limited to a few slow labored unsteady side steps at bedside before having to sit due to poor standing balance, fall risk and SpO2 dropping from 91% to 74% while  on room air  Stairs            Wheelchair Mobility     Tilt Bed    Modified Rankin (Stroke Patients Only)       Balance Overall balance assessment: Needs assistance Sitting-balance support: Feet supported, No upper extremity supported Sitting balance-Leahy Scale: Fair Sitting balance - Comments: seated at EOB   Standing  balance support: Reliant on assistive device for balance, During functional activity, Bilateral upper extremity supported Standing balance-Leahy Scale: Poor Standing balance comment: using RW                             Pertinent Vitals/Pain Pain Assessment Pain Assessment: Faces Faces Pain Scale: Hurts even more Pain Location: left hip with movement Pain Descriptors / Indicators: Sore, Grimacing, Discomfort, Sharp Pain Intervention(s): Limited activity within patient's tolerance, Monitored during session, Premedicated before session, Repositioned    Home Living Family/patient expects to be discharged to:: Private residence Living Arrangements: Alone Available Help at Discharge: Family;Available PRN/intermittently Type of Home: House Home Access: Stairs to enter Entrance Stairs-Rails: Right Entrance Stairs-Number of Steps: 2     Home Equipment: Cane - single point;Shower Counsellor (2 wheels)      Prior Function Prior Level of Function : Independent/Modified Independent             Mobility Comments: Household and short distanced community ambulator with SPC PRN, uses 2 LPM O2 PRN ADLs Comments: Assisted by family     Extremity/Trunk Assessment   Upper Extremity Assessment Upper Extremity Assessment: Generalized weakness    Lower Extremity Assessment Lower Extremity Assessment: Generalized weakness;LLE deficits/detail LLE Deficits / Details: grossly 3+/5 LLE: Unable to fully assess due to pain LLE Sensation: WNL LLE Coordination: WNL    Cervical / Trunk Assessment Cervical / Trunk Assessment: Kyphotic  Communication   Communication Communication: No apparent difficulties Cueing Techniques: Verbal cues;Tactile cues  Cognition Arousal: Alert Behavior During Therapy: WFL for tasks assessed/performed Overall Cognitive Status: History of cognitive impairments - at baseline                                          General  Comments      Exercises     Assessment/Plan    PT Assessment Patient needs continued PT services  PT Problem List Decreased strength;Decreased activity tolerance;Decreased balance;Cardiopulmonary status limiting activity;Decreased mobility;Pain       PT Treatment Interventions DME instruction;Gait training;Stair training;Functional mobility training;Therapeutic activities;Therapeutic exercise;Balance training;Patient/family education    PT Goals (Current goals can be found in the Care Plan section)  Acute Rehab PT Goals Patient Stated Goal: return home after rehab PT Goal Formulation: With patient/family Time For Goal Achievement: 07/19/23 Potential to Achieve Goals: Good    Frequency Min 4X/week     Co-evaluation               AM-PAC PT "6 Clicks" Mobility  Outcome Measure Help needed turning from your back to your side while in a flat bed without using bedrails?: A Lot Help needed moving from lying on your back to sitting on the side of a flat bed without using bedrails?: A Lot Help needed moving to and from a bed to a chair (including a wheelchair)?: A Lot Help needed standing up from a chair using your arms (e.g., wheelchair or bedside chair)?: A  Lot Help needed to walk in hospital room?: A Lot Help needed climbing 3-5 steps with a railing? : Total 6 Click Score: 11    End of Session Equipment Utilized During Treatment: Oxygen Activity Tolerance: Patient tolerated treatment well;Patient limited by fatigue Patient left: in chair;with call bell/phone within reach Nurse Communication: Mobility status PT Visit Diagnosis: Unsteadiness on feet (R26.81);Other abnormalities of gait and mobility (R26.89);Muscle weakness (generalized) (M62.81)    Time: 4098-1191 PT Time Calculation (min) (ACUTE ONLY): 23 min   Charges:   PT Evaluation $PT Eval Moderate Complexity: 1 Mod PT Treatments $Therapeutic Exercise: 23-37 mins PT General Charges $$ ACUTE PT VISIT: 1  Visit         12:38 PM, 07/05/23 [name], MPT Physical Therapist with [facility] [address] [phone] office [phone] mobile phone

## 2023-07-05 NOTE — Anesthesia Postprocedure Evaluation (Signed)
Anesthesia Post Note  Patient: Princeston Chapmon  Procedure(s) Performed: INTRAMEDULLARY (IM) NAIL INTERTROCHANTERIC (Left: Hip)  Patient location during evaluation: Phase II Anesthesia Type: Spinal Level of consciousness: awake Pain management: pain level controlled Vital Signs Assessment: post-procedure vital signs reviewed and stable Respiratory status: spontaneous breathing and respiratory function stable Cardiovascular status: blood pressure returned to baseline and stable Postop Assessment: no headache and no apparent nausea or vomiting Anesthetic complications: no Comments: Late entry   No notable events documented.   Last Vitals:  Vitals:   07/05/23 0444 07/05/23 0734  BP: 94/67   Pulse: 79   Resp: 20   Temp: 36.5 C   SpO2: 97% 97%    Last Pain:  Vitals:   07/05/23 0444  TempSrc: Oral  PainSc:                  Windell Norfolk

## 2023-07-05 NOTE — Progress Notes (Signed)
Patient slowly becoming congested in chest, started Incentive, and gave nebulizer.

## 2023-07-05 NOTE — TOC Progression Note (Addendum)
Transition of Care [facility]) - Progression Note    Patient Details  Name: Travis Murphy MRN: [id number] Date of Birth: 02-Nov-1934  Transition of Care [facility]) CM/SW Contact  Catalina Gravel, [place] Phone Number: 07/05/2023, 3:00 PM  Clinical Narrative:    CSW spoke with daughter Rosey Bath, who shared that she has been in touch with Dr.'s and family in agreement with SNF rehab.  They will discuss more with pt, he is recovering from procedure, plan to discuss further today and tomorrow. Preferred location in  [place], [facility] or [place]. TOC to follow.   Addendum- FL2 completed. PASSR Queued- will upload clinicals if requested.  Bed requests sent in HUB.  TOC to follow.     Barriers to Discharge: Continued Medical Work up  Expected Discharge Plan and Services       Living arrangements for the past 2 months: Single Family Home                                       Social Determinants of Health (SDOH) Interventions SDOH Screenings   Food Insecurity: No Food Insecurity (07/04/2023)  Housing: Low Risk  (07/04/2023)  Transportation Needs: No Transportation Needs (07/04/2023)  Utilities: Not At Risk (07/04/2023)  Alcohol Screen: Low Risk  (07/27/2022)  Depression (PHQ2-9): Low Risk  (07/27/2022)  Financial Resource Strain: Low Risk  (07/27/2022)  Physical Activity: Inactive (07/27/2022)  Social Connections: Moderately Isolated (07/27/2022)  Stress: No Stress Concern Present (07/27/2022)  Tobacco Use: High Risk (07/04/2023)    Readmission Risk Interventions     No data to display

## 2023-07-05 NOTE — NC FL2 (Signed)
[place] MEDICAID FL2 LEVEL OF CARE FORM     IDENTIFICATION  Patient Name: Travis Murphy Birthdate: 08-27-1934 Sex: male Admission Date (Current Location): 07/03/2023  [facility] and [place] Number:  [employer] and Address:  [facility],  [address], [place] [address]      Provider Number:    Attending Physician Name and Address:  Vassie Loll, MD  Relative Name and Phone Number:  Derenda Mis (Daughter)  [phone] [facility])    Current Level of Care: Hospital Recommended Level of Care: Skilled Nursing Facility Prior Approval Number:    Date Approved/Denied:   PASRR Number: Pending Queued  Discharge Plan: SNF    Current Diagnoses: Patient Active Problem List   Diagnosis Date Noted   Protein-calorie malnutrition, severe 07/04/2023   Intertrochanteric fracture of left hip, closed, initial encounter (HCC) 07/04/2023   Hip fracture (HCC) 07/03/2023   Chronic respiratory failure with hypoxia (HCC) 02/20/2023   Low serum cortisol level/AM Cortisol Level is 2.9- 08/27/2022   New onset a-fib (HCC) 08/26/2022   RSV (respiratory syncytial virus infection) 08/26/2022   Acute respiratory failure with hypoxia (HCC) 08/25/2022   RSV (respiratory syncytial virus pneumonia) 08/24/2022   Trigger ring finger of right hand 10/22/2021   Contracture of finger joint, right 10/22/2021   COPD GOLD 2 / still smoking 03/06/2021   Upper airway cough syndrome 03/06/2021   Acute hypoxemic respiratory failure due to COVID-19 [facility]) 02/05/2021   Pulmonary nodule    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   Hyperlipemia    Cigarette smoker    Incidental pulmonary nodule, greater than or equal to 8mm 04/06/2018   Severe aortic valve stenosis    Essential hypertension    History of colon polyps 07/15/2011    Orientation RESPIRATION BLADDER Height & Weight     Self, Time, Situation, Place  O2 External catheter Weight: 120 lb  (54.4 kg) Height:  5\' 6"  (167.6 cm)  BEHAVIORAL SYMPTOMS/MOOD NEUROLOGICAL BOWEL NUTRITION STATUS      Continent Diet  AMBULATORY STATUS COMMUNICATION OF NEEDS Skin   Limited Assist Verbally Normal                       Personal Care Assistance Level of Assistance  Bathing, Feeding, Dressing Bathing Assistance: Limited assistance Feeding assistance: Limited assistance Dressing Assistance: Limited assistance     Functional Limitations Info  Sight Sight Info: Impaired        SPECIAL CARE FACTORS FREQUENCY  PT (By licensed PT), OT (By licensed OT)     PT Frequency: 5X week OT Frequency: 5X week            Contractures Contractures Info: Not present    Additional Factors Info  Code Status, Allergies Code Status Info: DNR Allergies Info: NKA           Current Medications (07/05/2023):  This is the current hospital active medication list Current Facility-Administered Medications  Medication Dose Route Frequency Provider Last Rate Last Admin   acetaminophen (TYLENOL) tablet 325-650 mg  325-650 mg Oral Q6H PRN Vickki Hearing, MD       acetaminophen (TYLENOL) tablet 500 mg  500 mg Oral Once Vickki Hearing, MD       acetaminophen (TYLENOL) tablet 500 mg  500 mg Oral Q6H Vickki Hearing, MD   500 mg at 07/05/23 1212   albuterol (PROVENTIL) (2.5 MG/3ML) 0.083% nebulizer solution 2.5 mg  2.5 mg Nebulization Q2H PRN  Vickki Hearing, MD       aspirin EC tablet 325 mg  325 mg Oral Q breakfast Vickki Hearing, MD   325 mg at 07/05/23 2130   atorvastatin (LIPITOR) tablet 10 mg  10 mg Oral Daily Vickki Hearing, MD   10 mg at 07/05/23 0838   budesonide (PULMICORT) nebulizer solution 0.25 mg  0.25 mg Nebulization BID Vickki Hearing, MD   0.25 mg at 07/05/23 0734   Compressor/Nebulizer MISC 1 Units  1 Units Does not apply Daily PRN Vickki Hearing, MD       cyanocobalamin (VITAMIN B12) tablet 1,000 mcg  1,000 mcg Oral Daily Vickki Hearing,  MD   1,000 mcg at 07/05/23 0837   dextromethorphan-guaiFENesin (MUCINEX DM) 30-600 MG per 12 hr tablet 1 tablet  1 tablet Oral BID Vickki Hearing, MD   1 tablet at 07/05/23 8657   docusate sodium (COLACE) capsule 100 mg  100 mg Oral BID Vickki Hearing, MD   100 mg at 07/05/23 0838   donepezil (ARICEPT) tablet 10 mg  10 mg Oral q morning Vickki Hearing, MD   10 mg at 07/05/23 0837   famotidine (PEPCID) tablet 20 mg  20 mg Oral Daily Vickki Hearing, MD   20 mg at 07/05/23 0837   HYDROcodone-acetaminophen (NORCO/VICODIN) 5-325 MG per tablet 1-2 tablet  1-2 tablet Oral Q4H PRN Vickki Hearing, MD   1 tablet at 07/05/23 8469   menthol-cetylpyridinium (CEPACOL) lozenge 3 mg  1 lozenge Oral PRN Vickki Hearing, MD       Or   phenol (CHLORASEPTIC) mouth spray 1 spray  1 spray Mouth/Throat PRN Vickki Hearing, MD       methocarbamol (ROBAXIN) tablet 500 mg  500 mg Oral Q6H PRN Vickki Hearing, MD       Or   methocarbamol (ROBAXIN) injection 500 mg  500 mg Intravenous Q6H PRN Vickki Hearing, MD       metoCLOPramide (REGLAN) tablet 5-10 mg  5-10 mg Oral Q8H PRN Vickki Hearing, MD       Or   metoCLOPramide (REGLAN) injection 5-10 mg  5-10 mg Intravenous Q8H PRN Vickki Hearing, MD       midodrine (PROAMATINE) tablet 10 mg  10 mg Oral TID WC Vickki Hearing, MD   10 mg at 07/05/23 1212   morphine (PF) 2 MG/ML injection 0.5-1 mg  0.5-1 mg Intravenous Q2H PRN Vickki Hearing, MD       ondansetron [facility]) tablet 4 mg  4 mg Oral Q6H PRN Vickki Hearing, MD       Or   ondansetron [facility]) injection 4 mg  4 mg Intravenous Q6H PRN Vickki Hearing, MD       pantoprazole (PROTONIX) EC tablet 40 mg  40 mg Oral Daily Vickki Hearing, MD   40 mg at 07/05/23 0837   polyethylene glycol (MIRALAX / GLYCOLAX) packet 17 g  17 g Oral Daily PRN Vickki Hearing, MD       polyethylene glycol (MIRALAX / GLYCOLAX) packet 17 g  17 g Oral Daily Vickki Hearing, MD   17 g at 07/05/23 0837   tamsulosin (FLOMAX) capsule 0.8 mg  0.8 mg Oral Daily Vickki Hearing, MD   0.8 mg at 07/05/23 0838   traMADol (ULTRAM) tablet 50 mg  50 mg Oral Once Vickki Hearing, MD       traMADol (  ULTRAM) tablet 50 mg  50 mg Oral Q6H Vickki Hearing, MD   50 mg at 07/05/23 1212     Discharge Medications: Please see discharge summary for a list of discharge medications.  Relevant Imaging Results:  Relevant Lab Results:   Additional Information [id number]  Catalina Gravel, LCSW

## 2023-07-06 ENCOUNTER — Inpatient Hospital Stay (HOSPITAL_COMMUNITY)

## 2023-07-06 DIAGNOSIS — R9389 Abnormal findings on diagnostic imaging of other specified body structures: Secondary | ICD-10-CM | POA: Diagnosis not present

## 2023-07-06 DIAGNOSIS — R918 Other nonspecific abnormal finding of lung field: Secondary | ICD-10-CM | POA: Diagnosis not present

## 2023-07-06 DIAGNOSIS — I2699 Other pulmonary embolism without acute cor pulmonale: Secondary | ICD-10-CM | POA: Diagnosis not present

## 2023-07-06 DIAGNOSIS — Z7189 Other specified counseling: Secondary | ICD-10-CM | POA: Diagnosis not present

## 2023-07-06 DIAGNOSIS — K219 Gastro-esophageal reflux disease without esophagitis: Secondary | ICD-10-CM | POA: Diagnosis not present

## 2023-07-06 DIAGNOSIS — S72009A Fracture of unspecified part of neck of unspecified femur, initial encounter for closed fracture: Secondary | ICD-10-CM | POA: Diagnosis present

## 2023-07-06 DIAGNOSIS — J432 Centrilobular emphysema: Secondary | ICD-10-CM | POA: Diagnosis not present

## 2023-07-06 DIAGNOSIS — J9621 Acute and chronic respiratory failure with hypoxia: Secondary | ICD-10-CM | POA: Diagnosis not present

## 2023-07-06 DIAGNOSIS — J441 Chronic obstructive pulmonary disease with (acute) exacerbation: Secondary | ICD-10-CM | POA: Diagnosis present

## 2023-07-06 DIAGNOSIS — Z953 Presence of xenogenic heart valve: Secondary | ICD-10-CM | POA: Diagnosis not present

## 2023-07-06 DIAGNOSIS — R0602 Shortness of breath: Secondary | ICD-10-CM | POA: Diagnosis not present

## 2023-07-06 DIAGNOSIS — S72002S Fracture of unspecified part of neck of left femur, sequela: Secondary | ICD-10-CM | POA: Diagnosis not present

## 2023-07-06 DIAGNOSIS — S2239XA Fracture of one rib, unspecified side, initial encounter for closed fracture: Secondary | ICD-10-CM | POA: Diagnosis not present

## 2023-07-06 DIAGNOSIS — I959 Hypotension, unspecified: Secondary | ICD-10-CM | POA: Diagnosis not present

## 2023-07-06 DIAGNOSIS — I2609 Other pulmonary embolism with acute cor pulmonale: Secondary | ICD-10-CM | POA: Diagnosis not present

## 2023-07-06 DIAGNOSIS — Z952 Presence of prosthetic heart valve: Secondary | ICD-10-CM | POA: Diagnosis not present

## 2023-07-06 DIAGNOSIS — N401 Enlarged prostate with lower urinary tract symptoms: Secondary | ICD-10-CM | POA: Diagnosis present

## 2023-07-06 DIAGNOSIS — N471 Phimosis: Secondary | ICD-10-CM | POA: Diagnosis not present

## 2023-07-06 DIAGNOSIS — J470 Bronchiectasis with acute lower respiratory infection: Secondary | ICD-10-CM | POA: Diagnosis not present

## 2023-07-06 DIAGNOSIS — J189 Pneumonia, unspecified organism: Secondary | ICD-10-CM | POA: Diagnosis not present

## 2023-07-06 DIAGNOSIS — I350 Nonrheumatic aortic (valve) stenosis: Secondary | ICD-10-CM | POA: Diagnosis not present

## 2023-07-06 DIAGNOSIS — I700 Atherosclerosis of aorta: Secondary | ICD-10-CM | POA: Diagnosis present

## 2023-07-06 DIAGNOSIS — W1830XA Fall on same level, unspecified, initial encounter: Secondary | ICD-10-CM | POA: Diagnosis present

## 2023-07-06 DIAGNOSIS — S72002D Fracture of unspecified part of neck of left femur, subsequent encounter for closed fracture with routine healing: Secondary | ICD-10-CM | POA: Diagnosis not present

## 2023-07-06 DIAGNOSIS — Z66 Do not resuscitate: Secondary | ICD-10-CM | POA: Diagnosis present

## 2023-07-06 DIAGNOSIS — I4891 Unspecified atrial fibrillation: Secondary | ICD-10-CM | POA: Diagnosis not present

## 2023-07-06 DIAGNOSIS — Y9301 Activity, walking, marching and hiking: Secondary | ICD-10-CM | POA: Diagnosis present

## 2023-07-06 DIAGNOSIS — R0902 Hypoxemia: Secondary | ICD-10-CM | POA: Diagnosis not present

## 2023-07-06 DIAGNOSIS — E785 Hyperlipidemia, unspecified: Secondary | ICD-10-CM | POA: Diagnosis not present

## 2023-07-06 DIAGNOSIS — J440 Chronic obstructive pulmonary disease with acute lower respiratory infection: Secondary | ICD-10-CM | POA: Diagnosis not present

## 2023-07-06 DIAGNOSIS — R6521 Severe sepsis with septic shock: Secondary | ICD-10-CM | POA: Diagnosis not present

## 2023-07-06 DIAGNOSIS — A419 Sepsis, unspecified organism: Secondary | ICD-10-CM | POA: Diagnosis not present

## 2023-07-06 DIAGNOSIS — J9611 Chronic respiratory failure with hypoxia: Secondary | ICD-10-CM | POA: Diagnosis not present

## 2023-07-06 DIAGNOSIS — I5032 Chronic diastolic (congestive) heart failure: Secondary | ICD-10-CM | POA: Diagnosis present

## 2023-07-06 DIAGNOSIS — N35014 Post-traumatic urethral stricture, male, unspecified: Secondary | ICD-10-CM | POA: Diagnosis not present

## 2023-07-06 DIAGNOSIS — E43 Unspecified severe protein-calorie malnutrition: Secondary | ICD-10-CM | POA: Diagnosis present

## 2023-07-06 DIAGNOSIS — Z681 Body mass index (BMI) 19 or less, adult: Secondary | ICD-10-CM | POA: Diagnosis not present

## 2023-07-06 DIAGNOSIS — F0390 Unspecified dementia without behavioral disturbance: Secondary | ICD-10-CM | POA: Diagnosis present

## 2023-07-06 DIAGNOSIS — I10 Essential (primary) hypertension: Secondary | ICD-10-CM | POA: Diagnosis not present

## 2023-07-06 DIAGNOSIS — Y92512 Supermarket, store or market as the place of occurrence of the external cause: Secondary | ICD-10-CM | POA: Diagnosis not present

## 2023-07-06 DIAGNOSIS — J984 Other disorders of lung: Secondary | ICD-10-CM | POA: Diagnosis not present

## 2023-07-06 DIAGNOSIS — E782 Mixed hyperlipidemia: Secondary | ICD-10-CM | POA: Diagnosis not present

## 2023-07-06 DIAGNOSIS — I110 Hypertensive heart disease with heart failure: Secondary | ICD-10-CM | POA: Diagnosis present

## 2023-07-06 DIAGNOSIS — J90 Pleural effusion, not elsewhere classified: Secondary | ICD-10-CM | POA: Diagnosis not present

## 2023-07-06 DIAGNOSIS — Z452 Encounter for adjustment and management of vascular access device: Secondary | ICD-10-CM | POA: Diagnosis not present

## 2023-07-06 DIAGNOSIS — R339 Retention of urine, unspecified: Secondary | ICD-10-CM | POA: Diagnosis not present

## 2023-07-06 DIAGNOSIS — I2693 Single subsegmental pulmonary embolism without acute cor pulmonale: Secondary | ICD-10-CM | POA: Diagnosis not present

## 2023-07-06 DIAGNOSIS — Z515 Encounter for palliative care: Secondary | ICD-10-CM | POA: Diagnosis not present

## 2023-07-06 DIAGNOSIS — Z9981 Dependence on supplemental oxygen: Secondary | ICD-10-CM | POA: Diagnosis not present

## 2023-07-06 DIAGNOSIS — I480 Paroxysmal atrial fibrillation: Secondary | ICD-10-CM | POA: Diagnosis not present

## 2023-07-06 DIAGNOSIS — S72142A Displaced intertrochanteric fracture of left femur, initial encounter for closed fracture: Secondary | ICD-10-CM | POA: Diagnosis present

## 2023-07-06 DIAGNOSIS — F17210 Nicotine dependence, cigarettes, uncomplicated: Secondary | ICD-10-CM | POA: Diagnosis not present

## 2023-07-06 DIAGNOSIS — S62002D Unspecified fracture of navicular [scaphoid] bone of left wrist, subsequent encounter for fracture with routine healing: Secondary | ICD-10-CM | POA: Diagnosis not present

## 2023-07-06 LAB — CBC
HCT: 36.4 % — ABNORMAL LOW (ref 39.0–52.0)
Hemoglobin: 11.6 g/dL — ABNORMAL LOW (ref 13.0–17.0)
MCH: 31.4 pg (ref 26.0–34.0)
MCHC: 31.9 g/dL (ref 30.0–36.0)
MCV: 98.4 fL (ref 80.0–100.0)
Platelets: 157 10*3/uL (ref 150–400)
RBC: 3.7 MIL/uL — ABNORMAL LOW (ref 4.22–5.81)
RDW: 13.6 % (ref 11.5–15.5)
WBC: 11.8 10*3/uL — ABNORMAL HIGH (ref 4.0–10.5)
nRBC: 0 % (ref 0.0–0.2)

## 2023-07-06 MED ORDER — BUDESONIDE 0.25 MG/2ML IN SUSP
0.5000 mg | Freq: Two times a day (BID) | RESPIRATORY_TRACT | Status: DC
  Administered 2023-07-06 – 2023-07-08 (×4): 0.5 mg via RESPIRATORY_TRACT
  Administered 2023-07-08: 0.25 mg via RESPIRATORY_TRACT
  Administered 2023-07-09 – 2023-07-10 (×4): 0.5 mg via RESPIRATORY_TRACT
  Filled 2023-07-06 (×10): qty 4

## 2023-07-06 NOTE — Plan of Care (Signed)
  Problem: Education: Goal: Knowledge of General Education information will improve Description: Including pain rating scale, medication(s)/side effects and non-pharmacologic comfort measures Outcome: Progressing   Problem: Health Behavior/Discharge Planning: Goal: Ability to manage health-related needs will improve Outcome: Progressing   Problem: Clinical Measurements: Goal: Ability to maintain clinical measurements within normal limits will improve Outcome: Progressing Goal: Will remain free from infection Outcome: Progressing Goal: Diagnostic test results will improve Outcome: Progressing   Problem: Nutrition: Goal: Adequate nutrition will be maintained Outcome: Progressing   Problem: Pain Management: Goal: General experience of comfort will improve Outcome: Progressing   Problem: Safety: Goal: Ability to remain free from injury will improve Outcome: Progressing   Problem: Skin Integrity: Goal: Risk for impaired skin integrity will decrease Outcome: Progressing   Problem: Education: Goal: Verbalization of understanding the information provided (i.e., activity precautions, restrictions, etc) will improve Outcome: Progressing Goal: Individualized Educational Video(s) Outcome: Progressing   Problem: Activity: Goal: Ability to ambulate and perform ADLs will improve Outcome: Progressing   Problem: Clinical Measurements: Goal: Postoperative complications will be avoided or minimized Outcome: Progressing   Problem: Self-Concept: Goal: Ability to maintain and perform role responsibilities to the fullest extent possible will improve Outcome: Progressing   Problem: Pain Management: Goal: Pain level will decrease Outcome: Progressing

## 2023-07-06 NOTE — Progress Notes (Signed)
Physical Therapy Treatment Patient Details Name: Travis Murphy MRN: [id number] DOB: [birth year] Today's Date: 07/06/2023   History of Present Illness Travis Murphy  is a 88 y.o. male s/p ORIF Left hip fracture on 07/04/23, past medical history of COPD, on 2 L nasal cannula at home intermittently, severe aortic stenosis status post TAVR, hypertension, dementia.  -Presents to ED secondary to fall, and left hip pain, patient was walking out of convenience store today, when he fell, he denies feeling weak, dizzy, no loss of consciousness, he did report he did hit his head, but he is not on any blood thinners, he was able to stand with assistance from bystanders, and he was put into his daughter's car, who was able to get him home, but he was in too much pain, to get out of car, so she brought him to ED, patient had another fall recently, last week, but he denies any trauma.  Patient is on hospice for COPD    PT Comments  Patient demonstrates slow labored movement for sitting up at bedside with frequent leaning to the right due to guarding left hip, once seated required frequent verbal/tactile assistance for maintaining sitting balance, active assistance for completing most BLE exercises and limited to a few unsteady labored side steps at bedside due to buckling, weakness of LLE and increasing left hip.  Patient on 5 LPM O2 with SpO2 dropping from 91% to 80% during functional activities, after resting in chair SpO2 increased to 91%.  Patient tolerated sitting up in chair after therapy with family members present - nursing staff notified.  Patient will benefit from continued skilled physical therapy in hospital and recommended venue below to increase strength, balance, endurance for safe ADLs and gait.       If plan is discharge home, recommend the following: A lot of help with bathing/dressing/bathroom;A lot of help with walking and/or transfers;Help with stairs or ramp for entrance;Assistance with  cooking/housework   Can travel by private vehicle     No  Equipment Recommendations  None recommended by PT    Recommendations for Other Services       Precautions / Restrictions Precautions Precautions: Fall Restrictions Weight Bearing Restrictions: Yes LLE Weight Bearing: Weight bearing as tolerated     Mobility  Bed Mobility Overal bed mobility: Needs Assistance Bed Mobility: Supine to Sit     Supine to sit: Mod assist, Max assist     General bed mobility comments: increased time, labored movement with frequent leaning to the right due to left hip pain    Transfers Overall transfer level: Needs assistance Equipment used: Rolling walker (2 wheels) Transfers: Sit to/from Stand, Bed to chair/wheelchair/BSC Sit to Stand: Mod assist   Step pivot transfers: Mod assist, Max assist       General transfer comment: very unsteady on feet with frequent buckling of LLE due to increasing pain    Ambulation/Gait Ambulation/Gait assistance: Mod assist, Max assist Gait Distance (Feet): 3 Feet Assistive device: Rolling walker (2 wheels) Gait Pattern/deviations: Decreased step length - right, Decreased step length - left, Decreased stride length, Antalgic, Trunk flexed, Knees buckling Gait velocity: slow     General Gait Details: very unsteady on feet and limited to a few side steps before having to sit due to buckling of LLE with loss of balance   Stairs             Wheelchair Mobility     Tilt Bed    Modified Rankin (Stroke Patients Only)  Balance Overall balance assessment: Needs assistance Sitting-balance support: Feet supported, No upper extremity supported Sitting balance-Leahy Scale: Poor Sitting balance - Comments: seated at EOB Postural control: Right lateral lean Standing balance support: Reliant on assistive device for balance, During functional activity, Bilateral upper extremity supported Standing balance-Leahy Scale: Poor Standing  balance comment: using RW                            Cognition Arousal: Alert Behavior During Therapy: WFL for tasks assessed/performed Overall Cognitive Status: History of cognitive impairments - at baseline                                          Exercises General Exercises - Lower Extremity Ankle Circles/Pumps: Seated, AROM, Strengthening, Both, 10 reps Long Arc Quad: Seated, AROM, AAROM, Strengthening, Both, 10 reps Hip Flexion/Marching: Seated, AROM, AAROM, Strengthening, Both, 10 reps    General Comments        Pertinent Vitals/Pain Pain Assessment Pain Assessment: Faces Faces Pain Scale: Hurts even more Pain Location: left hip with movement Pain Descriptors / Indicators: Sore, Grimacing, Discomfort, Sharp Pain Intervention(s): Limited activity within patient's tolerance, Monitored during session, Premedicated before session, Repositioned    Home Living                          Prior Function            PT Goals (current goals can now be found in the care plan section) Acute Rehab PT Goals Patient Stated Goal: return home after rehab PT Goal Formulation: With patient/family Time For Goal Achievement: 07/19/23 Potential to Achieve Goals: Good Progress towards PT goals: Progressing toward goals    Frequency    Min 4X/week      PT Plan      Co-evaluation              AM-PAC PT "6 Clicks" Mobility   Outcome Measure  Help needed turning from your back to your side while in a flat bed without using bedrails?: A Lot Help needed moving from lying on your back to sitting on the side of a flat bed without using bedrails?: A Lot Help needed moving to and from a bed to a chair (including a wheelchair)?: A Lot Help needed standing up from a chair using your arms (e.g., wheelchair or bedside chair)?: A Lot Help needed to walk in hospital room?: A Lot Help needed climbing 3-5 steps with a railing? : Total 6 Click  Score: 11    End of Session Equipment Utilized During Treatment: Oxygen Activity Tolerance: Patient tolerated treatment well;Patient limited by fatigue Patient left: in chair;with call bell/phone within reach;with family/visitor present Nurse Communication: Mobility status PT Visit Diagnosis: Unsteadiness on feet (R26.81);Other abnormalities of gait and mobility (R26.89);Muscle weakness (generalized) (M62.81);Pain Pain - Right/Left: Left Pain - part of body: Hip     Time: 8413-2440 PT Time Calculation (min) (ACUTE ONLY): 27 min  Charges:    $Therapeutic Exercise: 8-22 mins $Therapeutic Activity: 8-22 mins PT General Charges $$ ACUTE PT VISIT: 1 Visit                     11:39 AM, 07/06/23 [name], MPT Physical Therapist with [facility] 336 [phone] office [phone] mobile phone

## 2023-07-06 NOTE — Progress Notes (Signed)
Progress Note   Patient: Travis Murphy OZD:[id number] DOB: [birth year] DOA: 07/03/2023     3 DOS: the patient was seen and examined on 07/06/2023   Brief hospital admission course: As per H&P written by Dr. Randol Kern on 07/03/2023 Travis Murphy  is a 87 y.o. male, past medical history of COPD, on 2 L nasal cannula at home intermittently, severe aortic stenosis status post TAVR, hypertension, dementia. -Presents to ED secondary to fall, and left hip pain, patient was walking out of convenience store today, when he fell, he denies feeling weak, dizzy, no loss of consciousness, he did report he did hit his head, but he is not on any blood thinners, he was able to stand with assistance from bystanders, and he was put into his daughter's car, who was able to get him home, but he was in too much pain, to get out of car, so she brought him to ED, patient had another fall recently, last week, but he denies any trauma.  Patient is on hospice for COPD -ED his workup significant for mild leukocytosis at 13.9, he was afebrile, his chest x-ray significant for chronic COPD, his left hip x-ray significant for mildly displaced left intertrochanteric fracture, ED physician discussed with Dr. Romeo Apple who requested admission.   Assessment and Plan: 1-left hip fracture -Secondary to mechanical fall. -Pending evaluation by orthopedic service with anticipated surgical intervention later today -Continue as needed analgesics -Status post intramedullary nail to left hip to stabilize fracture without significant complications. -Following postoperative care by orthopedic service okay to weight-bear as tolerated; full dose aspirin on daily basis for DVT prophylaxis and keep staples and dressings in place for 14 days. -They will follow patient up after discharge in the next 2 weeks. -Physical therapy has seen patient and is recommending skilled nursing facility for rehabilitation and conditioning.  2-COPD/chronic  respiratory failure with hypoxia -No using accessory muscles; wheezing and positive rhonchi appreciated. -Repeat chest x-ray demonstrating chronic bronchitic changes without acute infiltrates. -Continue the use of mucolytic's and adjust the dose of bronchodilator management.  No frank exacerbation appreciated. -Good air movement bilaterally -Continue home bronchodilator management and follow-up response.  3-dementia -Continue Aricept  -No behavioral disturbances currently appreciated. -Continue constant reorientation.    4-history of paroxysmal atrial fibrillation -Appears to be stable and in sinus rhythm at the moment -Given increased risk for falls and dementia no anticoagulation chronically. -Follow electrolytes and telemetry monitoring for another 24 hours; if stable will discontinue telemetry. -Continue patient follow-up with cardiology service..  5-gastroesophageal flux disease -Continue PPI.  6-chronic diastolic heart failure -Euvolemic, stable and compensated -Continue to follow daily weights/I's and O's. -Low-sodium diet discussed with patient.  7-history of severe aortic stenosis -Status post TAVR -Appears to be stable and well compensated with repeat echo in December 2023 demonstrating no presence of aortic stenosis after surgical intervention. -Patient aortic valve mean gradient measures 9.5 mmHg -Continue patient follow-up with cardiology service.  8-leukocytosis -Stress demargination most likely -No acute signs of acute infection -Continue to follow WBC trend.  9-constipation -Will provide MiraLAX and Colace   Subjective:  Afebrile, no chest pain, no nausea, no vomiting.  Overnight requiring higher level of oxygen supplementation and per nursing staff noticing increased congested sound in his lungs.  Physical Exam: Vitals:   07/05/23 2036 07/06/23 0653 07/06/23 0740 07/06/23 1354  BP: 99/75  105/61 (!) 93/59  Pulse: 79  (!) 101 89  Resp: 20  (!) 22 (!)  21  Temp: 98.6 F (37 C)  98.3 F (36.8 C) 98.2 F (36.8 C)  TempSrc:      SpO2: 98% 97% 94% 90%  Weight:      Height:       General exam: Alert, awake, following commands appropriately; reports no chest pain, no nausea, no vomiting.  Still having pain in his left hip. Respiratory system: Mild expiratory wheezing bilaterally; positive rhonchi, no using accessory muscle. Cardiovascular system: Rate controlled, no rubs, no gallops, no JVD. Gastrointestinal system: Abdomen is nondistended, soft and nontender. No organomegaly or masses felt. Normal bowel sounds heard. Central nervous system: Moving 4 limbs spontaneously.  No focal neurological deficits. Extremities: No cyanosis or clubbing; left hip dressings in place, clean and dry. Skin: No petechiae. Psychiatry: Mood & affect appropriate.    Data Reviewed: CBC: WBCs 11.8, hemoglobin 11.6 and platelet count 157K.  Family Communication: Daughter is at bedside.  Disposition: Status is: Inpatient Remains inpatient appropriate because: Status post intramedullary nail intertrochanteric on 07/05/2023; continue postoperative care and follow-up PT evaluation following a skilled nursing facility for rehabilitation.   Planned Discharge Destination: Patient seen by physical therapy with recommendations for skilled nursing facility to pursued rehab/conditioning.  Patient is medically ready for discharge once insurance authorization obtained.  Time spent: 40 minutes  Author: Vassie Loll, MD 07/06/2023 4:58 PM  For on call review [email or web address].

## 2023-07-06 NOTE — Progress Notes (Signed)
Pt has been alert and oriented x4 this shift. Pt has been ambulated to the chair and back to the bed. Pt has reported pain several times throughout shift and PRN medication provided. Pt was on 7L when this nurse came on shift but was able to be weaned back down to baseline 4L. Pt has not appeared to be in any distress. Family has been bedside all day. Proper safety precautions are in place.

## 2023-07-06 NOTE — TOC Progression Note (Addendum)
Transition of Care [facility]) - Progression Note    Patient Details  Name: Travis Murphy MRN: [id number] Date of Birth: 1934/10/22  Transition of Care [facility]) CM/SW Contact  Catalina Gravel, [place] Phone Number: 07/06/2023, 12:40 PM  Clinical Narrative:    CSW met pt in room, he was finishing a visit from PT - two daughters and sister there, pt sitting in chair. CSW spoke to pt about recommendation for SNF to strengthen and mobility. He was agreeable and said that he wants me to refer him.  Discussed choice, preference  SNF in this area, advised of two in [place]. Advised family that I would move forward with auth and [facility] team will follow up tomorrow.  CSW completed release and Auth in Nav[employer] portal.  TOC to follow.   Addend: Text to daughter advising [facility] offers a bed to dad and I was able to include their name for Ins auth- if a different choice is made- this can be updated.   CSW checked [employer]-Auth approved #[id number] 11/11-11/13.  Returned daughters call- advised approval received and tomorrow the may hear if [facility] available with offer or accept [facility].  TOC to follow.  Expected Discharge Plan: Skilled Nursing Facility Barriers to Discharge: Continued Medical Work up  Expected Discharge Plan and Services       Living arrangements for the past 2 months: Single Family Home                                       Social Determinants of Health (SDOH) Interventions SDOH Screenings   Food Insecurity: No Food Insecurity (07/04/2023)  Housing: Low Risk  (07/04/2023)  Transportation Needs: No Transportation Needs (07/04/2023)  Utilities: Not At Risk (07/04/2023)  Alcohol Screen: Low Risk  (07/27/2022)  Depression (PHQ2-9): Low Risk  (07/27/2022)  Financial Resource Strain: Low Risk  (07/27/2022)  Physical Activity: Inactive (07/27/2022)  Social Connections: Moderately Isolated (07/27/2022)  Stress: No Stress Concern Present (07/27/2022)  Tobacco Use: High Risk (07/04/2023)     Readmission Risk Interventions     No data to display

## 2023-07-07 ENCOUNTER — Inpatient Hospital Stay (HOSPITAL_COMMUNITY)

## 2023-07-07 DIAGNOSIS — S72002D Fracture of unspecified part of neck of left femur, subsequent encounter for closed fracture with routine healing: Secondary | ICD-10-CM | POA: Diagnosis not present

## 2023-07-07 DIAGNOSIS — I4891 Unspecified atrial fibrillation: Secondary | ICD-10-CM

## 2023-07-07 DIAGNOSIS — Z515 Encounter for palliative care: Secondary | ICD-10-CM | POA: Diagnosis not present

## 2023-07-07 DIAGNOSIS — E43 Unspecified severe protein-calorie malnutrition: Secondary | ICD-10-CM | POA: Diagnosis not present

## 2023-07-07 DIAGNOSIS — I10 Essential (primary) hypertension: Secondary | ICD-10-CM | POA: Diagnosis not present

## 2023-07-07 DIAGNOSIS — S72142A Displaced intertrochanteric fracture of left femur, initial encounter for closed fracture: Secondary | ICD-10-CM | POA: Diagnosis not present

## 2023-07-07 DIAGNOSIS — J9611 Chronic respiratory failure with hypoxia: Secondary | ICD-10-CM | POA: Diagnosis not present

## 2023-07-07 DIAGNOSIS — J441 Chronic obstructive pulmonary disease with (acute) exacerbation: Secondary | ICD-10-CM | POA: Diagnosis not present

## 2023-07-07 DIAGNOSIS — I350 Nonrheumatic aortic (valve) stenosis: Secondary | ICD-10-CM

## 2023-07-07 DIAGNOSIS — I2693 Single subsegmental pulmonary embolism without acute cor pulmonale: Secondary | ICD-10-CM | POA: Diagnosis not present

## 2023-07-07 DIAGNOSIS — I110 Hypertensive heart disease with heart failure: Secondary | ICD-10-CM | POA: Diagnosis not present

## 2023-07-07 DIAGNOSIS — R6521 Severe sepsis with septic shock: Secondary | ICD-10-CM | POA: Diagnosis not present

## 2023-07-07 DIAGNOSIS — Z66 Do not resuscitate: Secondary | ICD-10-CM | POA: Diagnosis not present

## 2023-07-07 DIAGNOSIS — J9621 Acute and chronic respiratory failure with hypoxia: Secondary | ICD-10-CM | POA: Diagnosis not present

## 2023-07-07 DIAGNOSIS — A419 Sepsis, unspecified organism: Secondary | ICD-10-CM | POA: Diagnosis not present

## 2023-07-07 LAB — TSH: TSH: 0.927 u[IU]/mL (ref 0.350–4.500)

## 2023-07-07 LAB — BLOOD GAS, ARTERIAL
Acid-Base Excess: 3.8 mmol/L — ABNORMAL HIGH (ref 0.0–2.0)
Bicarbonate: 28.5 mmol/L — ABNORMAL HIGH (ref 20.0–28.0)
Drawn by: 4539
FIO2: 36 %
O2 Saturation: 87.8 %
Patient temperature: 37.7
pCO2 arterial: 43 mm[Hg] (ref 32–48)
pH, Arterial: 7.43 (ref 7.35–7.45)
pO2, Arterial: 56 mm[Hg] — ABNORMAL LOW (ref 83–108)

## 2023-07-07 LAB — GLUCOSE, CAPILLARY: Glucose-Capillary: 178 mg/dL — ABNORMAL HIGH (ref 70–99)

## 2023-07-07 LAB — D-DIMER, QUANTITATIVE: D-Dimer, Quant: 12.05 ug{FEU}/mL — ABNORMAL HIGH (ref 0.00–0.50)

## 2023-07-07 LAB — TROPONIN I (HIGH SENSITIVITY): Troponin I (High Sensitivity): 34 ng/L — ABNORMAL HIGH (ref <18)

## 2023-07-07 LAB — MAGNESIUM: Magnesium: 2 mg/dL (ref 1.7–2.4)

## 2023-07-07 MED ORDER — METHOCARBAMOL 500 MG PO TABS: 500.0000 mg | ORAL_TABLET | Freq: Three times a day (TID) | ORAL | Status: DC | PRN

## 2023-07-07 MED ORDER — IOHEXOL 350 MG/ML SOLN
100.0000 mL | Freq: Once | INTRAVENOUS | Status: AC | PRN
  Administered 2023-07-07: 100 mL via INTRAVENOUS

## 2023-07-07 MED ORDER — METHYLPREDNISOLONE SODIUM SUCC 40 MG IJ SOLR
40.0000 mg | Freq: Two times a day (BID) | INTRAMUSCULAR | Status: DC
  Administered 2023-07-07: 40 mg via INTRAVENOUS
  Filled 2023-07-07 (×2): qty 1

## 2023-07-07 MED ORDER — ACETAMINOPHEN 500 MG PO TABS: 1000.0000 mg | ORAL_TABLET | Freq: Three times a day (TID) | ORAL | Status: DC

## 2023-07-07 MED ORDER — IPRATROPIUM BROMIDE 0.02 % IN SOLN
0.5000 mg | Freq: Three times a day (TID) | RESPIRATORY_TRACT | Status: DC
  Administered 2023-07-07 – 2023-07-09 (×6): 0.5 mg via RESPIRATORY_TRACT
  Filled 2023-07-07 (×6): qty 2.5

## 2023-07-07 MED ORDER — SODIUM CHLORIDE 0.9 % IV SOLN
250.0000 mL | INTRAVENOUS | Status: AC
  Administered 2023-07-07: 250 mL via INTRAVENOUS

## 2023-07-07 MED ORDER — ARFORMOTEROL TARTRATE 15 MCG/2ML IN NEBU
15.0000 ug | INHALATION_SOLUTION | Freq: Two times a day (BID) | RESPIRATORY_TRACT | Status: DC
  Administered 2023-07-07 – 2023-07-11 (×8): 15 ug via RESPIRATORY_TRACT
  Filled 2023-07-07 (×8): qty 2

## 2023-07-07 MED ORDER — AMIODARONE IV BOLUS ONLY 150 MG/100ML
INTRAVENOUS | Status: AC
  Administered 2023-07-07: 150 mg via INTRAVENOUS
  Filled 2023-07-07: qty 100

## 2023-07-07 MED ORDER — SODIUM CHLORIDE 0.9 % IV BOLUS
1000.0000 mL | Freq: Once | INTRAVENOUS | Status: AC
  Administered 2023-07-07: 1000 mL via INTRAVENOUS

## 2023-07-07 MED ORDER — MENTHOL 3 MG MT LOZG: 1.0000 | LOZENGE | OROMUCOSAL | Status: DC | PRN

## 2023-07-07 MED ORDER — LEVALBUTEROL HCL 0.63 MG/3ML IN NEBU
0.6300 mg | INHALATION_SOLUTION | Freq: Four times a day (QID) | RESPIRATORY_TRACT | Status: DC | PRN
  Administered 2023-07-10 – 2023-07-11 (×2): 0.63 mg via RESPIRATORY_TRACT
  Filled 2023-07-07 (×2): qty 3

## 2023-07-07 MED ORDER — PHENYLEPHRINE HCL-NACL 20-0.9 MG/250ML-% IV SOLN
25.0000 ug/min | INTRAVENOUS | Status: DC
  Administered 2023-07-07: 25 ug/min via INTRAVENOUS
  Administered 2023-07-08: 200 ug/min via INTRAVENOUS
  Administered 2023-07-08: 170 ug/min via INTRAVENOUS
  Administered 2023-07-08 (×3): 200 ug/min via INTRAVENOUS
  Administered 2023-07-08: 150 ug/min via INTRAVENOUS
  Administered 2023-07-08: 200 ug/min via INTRAVENOUS
  Administered 2023-07-08: 195 ug/min via INTRAVENOUS
  Administered 2023-07-08: 155 ug/min via INTRAVENOUS
  Administered 2023-07-08: 200 ug/min via INTRAVENOUS
  Administered 2023-07-08: 165 ug/min via INTRAVENOUS
  Administered 2023-07-09: 170 ug/min via INTRAVENOUS
  Administered 2023-07-09: 70 ug/min via INTRAVENOUS
  Administered 2023-07-09: 160 ug/min via INTRAVENOUS
  Administered 2023-07-09: 180 ug/min via INTRAVENOUS
  Filled 2023-07-07 (×14): qty 250

## 2023-07-07 MED ORDER — PHENYLEPHRINE HCL-NACL 20-0.9 MG/250ML-% IV SOLN: 0.0000 ug/min | INTRAVENOUS | Status: DC

## 2023-07-07 MED ORDER — AMIODARONE HCL IN DEXTROSE 360-4.14 MG/200ML-% IV SOLN
30.0000 mg/h | INTRAVENOUS | Status: DC
  Administered 2023-07-08 – 2023-07-11 (×8): 30 mg/h via INTRAVENOUS
  Filled 2023-07-07 (×9): qty 200

## 2023-07-07 MED ORDER — METOPROLOL TARTRATE 5 MG/5ML IV SOLN
2.5000 mg | Freq: Once | INTRAVENOUS | Status: AC
  Administered 2023-07-07: 2.5 mg via INTRAVENOUS
  Filled 2023-07-07: qty 5

## 2023-07-07 MED ORDER — LEVALBUTEROL HCL 0.63 MG/3ML IN NEBU
0.6300 mg | INHALATION_SOLUTION | Freq: Once | RESPIRATORY_TRACT | Status: AC
  Administered 2023-07-07: 0.63 mg via RESPIRATORY_TRACT
  Filled 2023-07-07: qty 3

## 2023-07-07 MED ORDER — ENSURE ENLIVE PO LIQD
237.0000 mL | Freq: Two times a day (BID) | ORAL | Status: DC
  Administered 2023-07-08 – 2023-07-11 (×3): 237 mL via ORAL

## 2023-07-07 MED ORDER — AMIODARONE IV BOLUS ONLY 150 MG/100ML: 150.0000 mg | Freq: Once | INTRAVENOUS | Status: AC

## 2023-07-07 MED ORDER — LEVALBUTEROL HCL 1.25 MG/0.5ML IN NEBU
1.2500 mg | INHALATION_SOLUTION | Freq: Once | RESPIRATORY_TRACT | Status: AC
  Administered 2023-07-07: 1.25 mg via RESPIRATORY_TRACT
  Filled 2023-07-07: qty 0.5

## 2023-07-07 MED ORDER — SODIUM CHLORIDE 0.9 % IV SOLN: INTRAVENOUS | Status: AC

## 2023-07-07 MED ORDER — CHLORHEXIDINE GLUCONATE CLOTH 2 % EX PADS
6.0000 | MEDICATED_PAD | Freq: Every day | CUTANEOUS | Status: DC
  Administered 2023-07-07 – 2023-07-11 (×5): 6 via TOPICAL

## 2023-07-07 MED ORDER — ASPIRIN 325 MG PO TBEC: 325.0000 mg | DELAYED_RELEASE_TABLET | Freq: Every day | ORAL | Status: DC

## 2023-07-07 MED ORDER — AMIODARONE HCL IN DEXTROSE 360-4.14 MG/200ML-% IV SOLN
60.0000 mg/h | INTRAVENOUS | Status: AC
  Administered 2023-07-07: 60 mg/h via INTRAVENOUS
  Filled 2023-07-07 (×2): qty 200

## 2023-07-07 MED ORDER — TRAMADOL HCL 50 MG PO TABS: 50.0000 mg | ORAL_TABLET | Freq: Two times a day (BID) | ORAL | 0 refills | Status: DC | PRN

## 2023-07-07 MED ORDER — ALPRAZOLAM 0.25 MG PO TABS: 0.2500 mg | ORAL_TABLET | Freq: Once | ORAL | Status: DC

## 2023-07-07 NOTE — Progress Notes (Signed)
Transported patient to and from CT without incident.  Patient tolerated well.  RN at bedside with patient at this time.

## 2023-07-07 NOTE — Progress Notes (Signed)
Rapid called at shift change.  Afib RVR.  Patient was given IV lopressor. SBP 70s at time of rapid. Patient appears to have labored breathing, but Lung sounds are without crackles/wheezes. He is diminished. BiPAP ordered. Patient is able to respond to questions. 1L Bolus ordered. Amiodarone bolus and infusion ordered.  Ortho notified of change in status.  ABG pending Trop pending

## 2023-07-07 NOTE — Progress Notes (Addendum)
Patients heart rate in the 140-150s, tele called to report as well. Charge nurse made aware. Amion Zierle-Ghosh. Rapid Response called at 1915. EKG obtained reading Afib with RVR. Vital signs: BP-78/53, HR-126, O2-93% on 4 liters. CBG-178. MD Mariea Clonts and MD Zierle-Ghosh at bedside.  New orders placed to transfer to ICU. Patient transported to ICU, report given to [facility].

## 2023-07-07 NOTE — Progress Notes (Signed)
Spoke with youngest daughter and the oldest daughter at bedside, as well as ICU RN family friend at bedside. We discussed what patient would want as far as how invasive to treat. We've decided against central line for now, but will re-address if he seems to be needing one. They are ok with pressors for now.  Also adding D-Dimer for now, and likely will lead to CTA. Family informed of that as well.

## 2023-07-07 NOTE — Progress Notes (Signed)
Family at bedside requesting to change code status from DNR to DNI per what patient would want. Apparently he had been on hospice before, which was revoked for surgery, but he would not want to be intubated per their report.

## 2023-07-07 NOTE — Progress Notes (Signed)
Pt. Arrived to ICU unit. Pt. Lethargic, eyes to spontaneous. Pt. BP 75/53 MAP 60 HR 133 in a fib rhythm, RR 23 with labored breathing and diminished lung sounds, O2 sats 100 with [place] at 3L. Temp 99.5 oral. MD at bedside. MD placed orders. Orders implemented. Family at bedside, family became very emotional. Support was given to patient and family.   Due to BP consistently being low, MD ordered Neo. Family asking what neo is. Nurse explained what neo is. Family/ pt.'s daughters ask about the BIPAP machine and start to discus amongst themselves that they are unsure if they/their dad would want all these interventions. Family requesting to speak to the doctor. Nurse notified MD.   Around 8:44pm: MD at bedside. Per MD and family, pressors (neo) okay for now.   Around 10:15pm: pt. Transported to and from CT with nurse and RT. Patient tolerated transport well. Pt. Back in room with family at bedside.

## 2023-07-07 NOTE — Discharge Summary (Signed)
Physician Discharge Summary   Patient: Travis Murphy MRN: [id number] DOB: [birth year]  Admit date:     07/03/2023  Discharge date: 07/07/23  Discharge Physician: Vassie Loll   PCP: Carylon Perches, MD   Recommendations at discharge:  Repeat CBC in 1 week to assess hemoglobin trend/stability Repeat basic metabolic panel to follow electrolytes and renal function. Reassess blood pressure and adjust antihypertensive regimen as required.  Discharge Diagnoses: Principal Problem:   Hip fracture (HCC) Active Problems:   New onset a-fib (HCC)   Essential hypertension   S/P TAVR (transcatheter aortic valve replacement)   Severe aortic valve stenosis   Hyperlipemia   Chronic respiratory failure with hypoxia (HCC)   Protein-calorie malnutrition, severe   Intertrochanteric fracture of left hip, closed, initial encounter [facility])  Brief hospital admission course: As per H&P written by Dr. Randol Kern on 07/03/2023 Travis Murphy  is a 88 y.o. male, past medical history of COPD, on 2 L nasal cannula at home intermittently, severe aortic stenosis status post TAVR, hypertension, dementia. -Presents to ED secondary to fall, and left hip pain, patient was walking out of convenience store today, when he fell, he denies feeling weak, dizzy, no loss of consciousness, he did report he did hit his head, but he is not on any blood thinners, he was able to stand with assistance from bystanders, and he was put into his daughter's car, who was able to get him home, but he was in too much pain, to get out of car, so she brought him to ED, patient had another fall recently, last week, but he denies any trauma.  Patient is on hospice for COPD -ED his workup significant for mild leukocytosis at 13.9, he was afebrile, his chest x-ray significant for chronic COPD, his left hip x-ray significant for mildly displaced left intertrochanteric fracture, ED physician discussed with Dr. Romeo Apple who requested  admission.  Assessment and Plan: 1-left hip fracture -Secondary to mechanical fall. -Pending evaluation by orthopedic service with anticipated surgical intervention later today -Continue as needed analgesics -Status post intramedullary nail to left hip to stabilize fracture without significant complications. -Following postoperative care by orthopedic service okay to weight-bear as tolerated; full dose aspirin on daily basis for DVT prophylaxis and keep staples and dressings in place for 14 days. -They will follow patient up after discharge in the next 2 weeks. -Physical therapy has seen patient and is recommending skilled nursing facility for rehabilitation and conditioning.   2-COPD/chronic respiratory failure with hypoxia -No using accessory muscles; wheezing and positive rhonchi appreciated. -Repeat chest x-ray demonstrating chronic bronchitic changes without acute infiltrates. -Good air movement bilaterally without signs of any exacerbation at the moment. -Continue home bronchodilator management and follow-up with PCP/pulmonologist as an outpatient.   3-dementia -Continue Aricept  -No behavioral disturbances currently appreciated. -Continue constant reorientation.     4-history of paroxysmal atrial fibrillation -Appears to be stable and in sinus rhythm at the moment -Given increased risk for falls and dementia no anticoagulation chronically. -Follow electrolytes and telemetry monitoring for another 24 hours; if stable will discontinue telemetry. -Continue patient follow-up with cardiology service..   5-gastroesophageal flux disease -Continue PPI.   6-chronic diastolic heart failure -Euvolemic, stable and compensated -Continue to follow daily weights/I's and O's. -Low-sodium diet discussed with patient.   7-history of severe aortic stenosis -Status post TAVR -Appears to be stable and well compensated with repeat echo in December 2023 demonstrating no presence of aortic  stenosis after surgical intervention. -Patient aortic valve mean gradient measures  9.5 mmHg -Continue patient follow-up with cardiology service.   8-leukocytosis -Stress demargination most likely -No acute signs of acute infection -Continue to follow WBC trend.   9-constipation -Okay to use as needed MiraLAX and Colace.   10-protein severe calorie malnutrition -Continue adequate nutrition and adjuvant feeding supplements.  Consultants: Orthopedic service Procedures performed: Status post left hip ORIF, see below for x-ray reports. Disposition: Skilled nursing facility Diet recommendation: Heart healthy diet.  DISCHARGE MEDICATION: Allergies as of 07/07/2023   No Known Allergies      Medication List     TAKE these medications    acetaminophen 500 MG tablet Commonly known as: TYLENOL Take 2 tablets (1,000 mg total) by mouth in the morning, at noon, and at bedtime.   albuterol (2.5 MG/3ML) 0.083% nebulizer solution Commonly known as: PROVENTIL TAKE 3 ML (2.5 MG TOTAL) BY NEBULIZATION EVERY 4 HOURS AS NEEDED FOR WHEEZING OR SHORTNESS OF BREATH   aspirin EC 325 MG tablet Take 1 tablet (325 mg total) by mouth daily. After 30 days, ok to resume baby aspirin daily (81mg ) What changed:  medication strength how much to take when to take this additional instructions   atorvastatin 10 MG tablet Commonly known as: LIPITOR Take 10 mg by mouth daily.   budesonide 0.25 MG/2ML nebulizer solution Commonly known as: Pulmicort One vial twice daily with albuterol   Compressor/Nebulizer Misc 1 Units by Does not apply route daily as needed.   cyanocobalamin 1000 MCG tablet Commonly known as: VITAMIN B12 Take 1,000 mcg by mouth daily.   dextromethorphan-guaiFENesin 30-600 MG 12hr tablet Commonly known as: MUCINEX DM Take 1 tablet by mouth 2 (two) times daily.   donepezil 10 MG tablet Commonly known as: ARICEPT Take 10 mg by mouth every morning.   famotidine 20 MG  tablet Commonly known as: Pepcid One after supper   menthol-cetylpyridinium 3 MG lozenge Commonly known as: CEPACOL Take 1 lozenge (3 mg total) by mouth as needed for sore throat.   methocarbamol 500 MG tablet Commonly known as: ROBAXIN Take 1 tablet (500 mg total) by mouth every 8 (eight) hours as needed for muscle spasms.   midodrine 10 MG tablet Commonly known as: PROAMATINE Take 1 tablet (10 mg total) by mouth 3 (three) times daily with meals.   pantoprazole 40 MG tablet Commonly known as: PROTONIX TAKE 1 TABLET (40 MG TOTAL) BY MOUTH DAILY. TAKE 30-60 MIN BEFORE FIRST MEAL OF THE DAY   tamsulosin 0.4 MG Caps capsule Commonly known as: FLOMAX Take 0.8 mg by mouth daily.   traMADol 50 MG tablet Commonly known as: ULTRAM Take 1 tablet (50 mg total) by mouth every 12 (twelve) hours as needed for severe pain (pain score 7-10).        Follow-up Information     Carylon Perches, MD. Schedule an appointment as soon as possible for a visit in 2 week(s).   Specialty: Internal Medicine Why: After discharge from the skilled nursing facility. Contact information: [address] [place] [address] [phone]         Vickki Hearing, MD. Schedule an appointment as soon as possible for a visit in 10 day(s).   Specialties: Orthopedic Surgery, Radiology Contact information: [address] [place] [address] [phone]                Discharge Exam: Ceasar Mons Weights   07/03/23 1123  Weight: 54.4 kg   General exam: Alert, awake, following commands appropriately; reports no chest pain, no nausea,  no vomiting.  Still having pain in his left hip. Respiratory system: Mild expiratory wheezing bilaterally; positive rhonchi, no using accessory muscle. Cardiovascular system: Rate controlled, no rubs, no gallops, no JVD. Gastrointestinal system: Abdomen is nondistended, soft and nontender. No organomegaly or masses felt. Normal bowel sounds  heard. Central nervous system: Moving 4 limbs spontaneously.  No focal neurological deficits. Extremities: No cyanosis or clubbing; left hip dressings in place, clean and dry. Skin: No petechiae. Psychiatry: Mood & affect appropriate.   Condition at discharge: Stable.  The results of significant diagnostics from this hospitalization (including imaging, microbiology, ancillary and laboratory) are listed below for reference.   Imaging Studies: DG CHEST PORT 1 VIEW  Result Date: 07/06/2023 CLINICAL DATA:  Shortness of breath. EXAM: PORTABLE CHEST 1 VIEW COMPARISON:  07/03/2023 FINDINGS: Low volume film with asymmetric elevation right hemidiaphragm, similar to prior. The cardio pericardial silhouette is enlarged. Status post TAVR. Interstitial markings are diffusely coarsened with chronic features. Bones are diffusely demineralized. IMPRESSION: 1. Low volume film with asymmetric elevation right hemidiaphragm. 2. Chronic interstitial coarsening. Electronically Signed   By: Kennith Center M.D.   On: 07/06/2023 12:28   DG C-Arm 1-60 Min  Result Date: 07/04/2023 CLINICAL DATA:  Fracture, left hip IM nail. EXAM: DG HIP (WITH OR WITHOUT PELVIS) 2-3V LEFT; DG C-ARM 1-60 MIN COMPARISON:  Preoperative imaging. FINDINGS: Eight fluoroscopic spot views of the pelvis and left hip obtained in the operating room. Skull antral images during femoral intramedullary nail with trans trochanteric and distal locking screw fixation traversing proximal femur fracture. Fluoroscopy time 1 minutes 35 seconds. Dose 15.22 mGy. IMPRESSION: Intraoperative fluoroscopy during proximal femur fracture fixation. Electronically Signed   By: Narda Rutherford M.D.   On: 07/04/2023 18:06   DG HIP UNILAT WITH PELVIS 2-3 VIEWS LEFT  Result Date: 07/04/2023 CLINICAL DATA:  Fracture, left hip IM nail. EXAM: DG HIP (WITH OR WITHOUT PELVIS) 2-3V LEFT; DG C-ARM 1-60 MIN COMPARISON:  Preoperative imaging. FINDINGS: Eight fluoroscopic spot views  of the pelvis and left hip obtained in the operating room. Skull antral images during femoral intramedullary nail with trans trochanteric and distal locking screw fixation traversing proximal femur fracture. Fluoroscopy time 1 minutes 35 seconds. Dose 15.22 mGy. IMPRESSION: Intraoperative fluoroscopy during proximal femur fracture fixation. Electronically Signed   By: Narda Rutherford M.D.   On: 07/04/2023 18:06   DG HIP UNILAT WITH PELVIS 2-3 VIEWS LEFT  Result Date: 07/04/2023 CLINICAL DATA:  Postop. EXAM: DG HIP (WITH OR WITHOUT PELVIS) 2-3V LEFT COMPARISON:  Preoperative imaging. FINDINGS: Femoral intramedullary nail with trans trochanteric and distal locking screw fixation traversing intertrochanteric femur fracture. Improved fracture alignment from preoperative imaging. Recent postsurgical change includes air and edema in the soft tissues. IMPRESSION: ORIF of intertrochanteric femur fracture with improved alignment. Electronically Signed   By: Narda Rutherford M.D.   On: 07/04/2023 16:38   DG Chest Port 1 View  Result Date: 07/03/2023 CLINICAL DATA:  fall. EXAM: PORTABLE CHEST 1 VIEW COMPARISON:  06/24/2023. FINDINGS: Redemonstration of increased interstitial markings throughout bilateral lungs, which may represent underlying chronic interstitial lung disease versus emphysema. No frank pulmonary edema. No acute consolidation or lung collapse. Bilateral lateral costophrenic angles are clear. Stable cardio-mediastinal silhouette. Prosthetic aortic valve noted. No acute osseous abnormalities. The soft tissues are within normal limits. IMPRESSION: *No acute cardiopulmonary abnormality. *Redemonstration of increased interstitial markings, which may represent chronic interstitial lung disease versus emphysema. Electronically Signed   By: Jules Schick M.D.   On: 07/03/2023 15:06  DG Hip Unilat With Pelvis 2-3 Views Left  Result Date: 07/03/2023 CLINICAL DATA:  Fall.  Left hip pain. EXAM: DG HIP (WITH  OR WITHOUT PELVIS) 2-3V LEFT COMPARISON:  None Available. FINDINGS: There is mildly displaced intertrochanteric left femur fracture. No other acute fracture or dislocation. No aggressive osseous lesion. Visualized sacral arcuate lines are unremarkable. There are mild degenerative changes of bilateral hip joints without significant joint space narrowing. Osteophytosis of the superior acetabulum. No radiopaque foreign bodies. IMPRESSION: *Mildly displaced left intertrochanteric femur fracture. Electronically Signed   By: Jules Schick M.D.   On: 07/03/2023 15:04   CT Head Wo Contrast  Result Date: 07/03/2023 CLINICAL DATA:  Fall, weakness EXAM: CT HEAD WITHOUT CONTRAST CT CERVICAL SPINE WITHOUT CONTRAST TECHNIQUE: Multidetector CT imaging of the head and cervical spine was performed following the standard protocol without intravenous contrast. Multiplanar CT image reconstructions of the cervical spine were also generated. RADIATION DOSE REDUCTION: This exam was performed according to the departmental dose-optimization program which includes automated exposure control, adjustment of the mA and/or kV according to patient size and/or use of iterative reconstruction technique. COMPARISON:  06/24/2023 FINDINGS: CT HEAD FINDINGS Brain: No evidence of acute infarction, hemorrhage, hydrocephalus, extra-axial collection or mass lesion/mass effect. Extensive periventricular and deep white matter hypodensity. Vascular: No hyperdense vessel or unexpected calcification. Skull: Normal. Negative for fracture or focal lesion. Sinuses/Orbits: No acute finding. Other: None. CT CERVICAL SPINE FINDINGS Alignment: Normal. Skull base and vertebrae: No acute fracture. No primary bone lesion or focal pathologic process. Soft tissues and spinal canal: No prevertebral fluid or swelling. No visible canal hematoma. Disc levels: Mild-to-moderate disc space height loss and osteophytosis of the lower cervical levels, worst from C5-C7. Upper  chest: Negative. Other: None. IMPRESSION: 1. No acute intracranial pathology. Advanced small-vessel white matter disease in keeping with patient age. 2. No fracture or static subluxation of the cervical spine. 3. Mild-to-moderate disc space height loss and osteophytosis of the lower cervical levels, worst from C5-C7. Electronically Signed   By: Jearld Lesch M.D.   On: 07/03/2023 14:02   CT Cervical Spine Wo Contrast  Result Date: 07/03/2023 CLINICAL DATA:  Fall, weakness EXAM: CT HEAD WITHOUT CONTRAST CT CERVICAL SPINE WITHOUT CONTRAST TECHNIQUE: Multidetector CT imaging of the head and cervical spine was performed following the standard protocol without intravenous contrast. Multiplanar CT image reconstructions of the cervical spine were also generated. RADIATION DOSE REDUCTION: This exam was performed according to the departmental dose-optimization program which includes automated exposure control, adjustment of the mA and/or kV according to patient size and/or use of iterative reconstruction technique. COMPARISON:  06/24/2023 FINDINGS: CT HEAD FINDINGS Brain: No evidence of acute infarction, hemorrhage, hydrocephalus, extra-axial collection or mass lesion/mass effect. Extensive periventricular and deep white matter hypodensity. Vascular: No hyperdense vessel or unexpected calcification. Skull: Normal. Negative for fracture or focal lesion. Sinuses/Orbits: No acute finding. Other: None. CT CERVICAL SPINE FINDINGS Alignment: Normal. Skull base and vertebrae: No acute fracture. No primary bone lesion or focal pathologic process. Soft tissues and spinal canal: No prevertebral fluid or swelling. No visible canal hematoma. Disc levels: Mild-to-moderate disc space height loss and osteophytosis of the lower cervical levels, worst from C5-C7. Upper chest: Negative. Other: None. IMPRESSION: 1. No acute intracranial pathology. Advanced small-vessel white matter disease in keeping with patient age. 2. No fracture or  static subluxation of the cervical spine. 3. Mild-to-moderate disc space height loss and osteophytosis of the lower cervical levels, worst from C5-C7. Electronically Signed   By: Trinna Post  Jane Canary M.D.   On: 07/03/2023 14:02   DG Chest 2 View  Result Date: 06/24/2023 CLINICAL DATA:  COPD EXAM: CHEST - 2 VIEW COMPARISON:  Chest x-ray 02/11/2023 FINDINGS: The heart size and mediastinal contours are within normal limits. Both lungs are clear. The visualized skeletal structures are unremarkable. IMPRESSION: No active cardiopulmonary disease. Electronically Signed   By: Darliss Cheney M.D.   On: 06/24/2023 22:06   CT Head Wo Contrast  Result Date: 06/24/2023 CLINICAL DATA:  Fall EXAM: CT HEAD WITHOUT CONTRAST TECHNIQUE: Contiguous axial images were obtained from the base of the skull through the vertex without intravenous contrast. RADIATION DOSE REDUCTION: This exam was performed according to the departmental dose-optimization program which includes automated exposure control, adjustment of the mA and/or kV according to patient size and/or use of iterative reconstruction technique. COMPARISON:  08/19/2012 FINDINGS: Brain: No mass, hemorrhage or extra-axial collection. There is hypoattenuation of the white matter. CSF spaces are normal. Vascular: There is atherosclerotic calcification of both internal carotid arteries at the skull base. Skull: Normal Sinuses/Orbits: Trace left mastoid fluid. Paranasal sinuses are clear. Orbits are normal. Right ocular lens replacement. Other: None. IMPRESSION: 1. No acute intracranial abnormality. 2. Chronic small vessel ischemia. Electronically Signed   By: Deatra Robinson M.D.   On: 06/24/2023 20:51    Microbiology: Results for orders placed or performed during the hospital encounter of 07/03/23  MRSA Next Gen by PCR, Nasal     Status: None   Collection Time: 07/03/23  8:32 PM   Specimen: Nasal Mucosa; Nasal Swab  Result Value Ref Range Status   MRSA by PCR Next Gen NOT  DETECTED NOT DETECTED Final    Comment: (NOTE) The GeneXpert MRSA Assay (FDA approved for NASAL specimens only), is one component of a comprehensive MRSA colonization surveillance program. It is not intended to diagnose MRSA infection nor to guide or monitor treatment for MRSA infections. Test performance is not FDA approved in patients less than 87 years old. Performed at [facility], [address] [place], [place] [address]     Labs: CBC: Recent Labs  Lab 07/03/23 1229 07/04/23 0620 07/05/23 0340 07/06/23 0318  WBC 13.9* 7.8 12.8* 11.8*  HGB 12.9* 12.5* 11.1* 11.6*  HCT 39.8 39.4 34.5* 36.4*  MCV 96.1 97.0 97.7 98.4  PLT 201 172 162 157   Basic Metabolic Panel: Recent Labs  Lab 07/03/23 1229 07/04/23 0620  NA 137 137  K 4.1 4.0  CL 102 103  CO2 27 28  GLUCOSE 123* 110*  BUN 17 18  CREATININE 1.05 1.03  CALCIUM 8.2* 7.9*  MG  --  2.1  PHOS  --  3.4    Discharge time spent: greater than 30 minutes.  Signed: Vassie Loll, MD [facility] 07/07/2023

## 2023-07-07 NOTE — Progress Notes (Signed)
EMS arrived to get patient, patients HR in the 120s-130s and holding. Patient having congestion, oxygen saturation 85% on 2 liters. Family at bedside, crying and concerned about patients breathing and congestion. Noted breathing labored, patient shaking. MD Selina Cooley by Earnstine Regal RN. MD spoke with this writer regarding patients condition. Assessed by Paulino Rily. MD placed new orders for neb treatment. RT called to give patient neb treatment. Will continue to monitor patient.

## 2023-07-07 NOTE — Plan of Care (Signed)

## 2023-07-07 NOTE — Progress Notes (Signed)
Physical Therapy Treatment Patient Details Name: Travis Murphy MRN: [id number] DOB: 23-Sep-1934 Today's Date: 07/07/2023   History of Present Illness Travis Murphy  is a 88 y.o. male s/p ORIF Left hip fracture on 07/04/23, past medical history of COPD, on 2 L nasal cannula at home intermittently, severe aortic stenosis status post TAVR, hypertension, dementia.  -Presents to ED secondary to fall, and left hip pain, patient was walking out of convenience store today, when he fell, he denies feeling weak, dizzy, no loss of consciousness, he did report he did hit his head, but he is not on any blood thinners, he was able to stand with assistance from bystanders, and he was put into his daughter's car, who was able to get him home, but he was in too much pain, to get out of car, so she brought him to ED, patient had another fall recently, last week, but he denies any trauma.  Patient is on hospice for COPD    PT Comments  Patient demonstrates slow labored movement for sitting up at bedside, once seated has frequent leaning/falling backwards requiring Max verbal/tactile to maintain sitting balance, very unsteady on feet and limited to a few side steps before having to sit due to fall risk and BLE weakness.  Patient tolerated sitting up in chair after therapy with family members present.  Patient will benefit from continued skilled physical therapy in hospital and recommended venue below to increase strength, balance, endurance for safe ADLs and gait.      If plan is discharge home, recommend the following: A lot of help with bathing/dressing/bathroom;A lot of help with walking and/or transfers;Help with stairs or ramp for entrance;Assistance with cooking/housework   Can travel by private vehicle     No  Equipment Recommendations  None recommended by PT    Recommendations for Other Services       Precautions / Restrictions Precautions Precautions: Fall Restrictions Weight Bearing  Restrictions: Yes LLE Weight Bearing: Weight bearing as tolerated     Mobility  Bed Mobility Overal bed mobility: Needs Assistance Bed Mobility: Supine to Sit     Supine to sit: Mod assist, Max assist     General bed mobility comments: increased time, labored movement with frequent leaning to the right due to left hip pain    Transfers Overall transfer level: Needs assistance Equipment used: Rolling walker (2 wheels) Transfers: Sit to/from Stand, Bed to chair/wheelchair/BSC Sit to Stand: Mod assist, Max assist   Step pivot transfers: Mod assist, Max assist       General transfer comment: unsteady labored movement with frequent verbal/tacile cueing for proper hand placement on RW    Ambulation/Gait Ambulation/Gait assistance: Max assist Gait Distance (Feet): 3 Feet Assistive device: Rolling walker (2 wheels) Gait Pattern/deviations: Decreased step length - right, Decreased step length - left, Decreased stride length, Antalgic, Trunk flexed, Knees buckling Gait velocity: slow     General Gait Details: limited to a few side steps before having to sit due to buckling of knees, fall risk   Stairs             Wheelchair Mobility     Tilt Bed    Modified Rankin (Stroke Patients Only)       Balance Overall balance assessment: Needs assistance Sitting-balance support: Feet supported, Bilateral upper extremity supported Sitting balance-Leahy Scale: Good Sitting balance - Comments: seated at EOB Postural control: Posterior lean Standing balance support: Reliant on assistive device for balance, During functional activity, Bilateral upper extremity  supported Standing balance-Leahy Scale: Poor Standing balance comment: using RW                            Cognition Arousal: Alert Behavior During Therapy: WFL for tasks assessed/performed Overall Cognitive Status: History of cognitive impairments - at baseline                                           Exercises General Exercises - Lower Extremity Ankle Circles/Pumps: Seated, AROM, Strengthening, Both, 10 reps Long Arc Quad: Seated, AROM, AAROM, Strengthening, Both, 10 reps Hip Flexion/Marching: Seated, AROM, AAROM, Strengthening, Both, 10 reps    General Comments        Pertinent Vitals/Pain Pain Assessment Pain Assessment: Faces Faces Pain Scale: Hurts even more Pain Location: left hip with movement Pain Descriptors / Indicators: Sore, Grimacing, Discomfort, Sharp Pain Intervention(s): Limited activity within patient's tolerance, Monitored during session, Premedicated before session, Repositioned    Home Living                          Prior Function            PT Goals (current goals can now be found in the care plan section) Acute Rehab PT Goals Patient Stated Goal: return home after rehab PT Goal Formulation: With patient/family Potential to Achieve Goals: Good Progress towards PT goals: Progressing toward goals    Frequency    Min 4X/week      PT Plan      Co-evaluation              AM-PAC PT "6 Clicks" Mobility   Outcome Measure  Help needed turning from your back to your side while in a flat bed without using bedrails?: A Lot Help needed moving from lying on your back to sitting on the side of a flat bed without using bedrails?: A Lot Help needed moving to and from a bed to a chair (including a wheelchair)?: A Lot Help needed standing up from a chair using your arms (e.g., wheelchair or bedside chair)?: A Lot Help needed to walk in hospital room?: A Lot Help needed climbing 3-5 steps with a railing? : Total 6 Click Score: 11    End of Session Equipment Utilized During Treatment: Oxygen   Patient left: in chair;with call bell/phone within reach;with family/visitor present Nurse Communication: Mobility status PT Visit Diagnosis: Unsteadiness on feet (R26.81);Other abnormalities of gait and mobility  (R26.89);Muscle weakness (generalized) (M62.81);Pain Pain - Right/Left: Left Pain - part of body: Hip     Time: 1035-1101 PT Time Calculation (min) (ACUTE ONLY): 26 min  Charges:    $Therapeutic Exercise: 8-22 mins $Therapeutic Activity: 8-22 mins PT General Charges $$ ACUTE PT VISIT: 1 Visit                     12:33 PM, 07/07/23 [name], MPT Physical Therapist with [facility] [phone] office [phone] mobile phone

## 2023-07-07 NOTE — Progress Notes (Signed)
Addendum: Patient respiratory status has decompensated prior to discharge, requiring higher levels of oxyged, increased rhonchi/congestion sound and associated tachypnea.  Plan: -repeat x-ray -adjusted bronchodilator management and nebulizer treatment provided -low dose lopressor given and one dose of PRN xanax to assist with air hunger and tachypnea. -discharge has been cancel for now -will continue treatment and follow response.   Vassie Loll MD [phone]

## 2023-07-07 NOTE — TOC Transition Note (Signed)
Transition of Care [facility]) - CM/SW Discharge Note   Patient Details  Name: Travis Murphy MRN: [id number] Date of Birth: April 09, 1935  Transition of Care [facility]) CM/SW Contact:  Karn Cassis, LCSW Phone Number: 07/07/2023, 1:38 PM   Clinical Narrative:  Pt's daughters requested to see if [facility] had bed available. Medicare.gov ratings reviewed for area SNFs. Referral sent and bed offer made by [facility]. Daughters notified and have accepted bed at [facility]. Debbie at [facility] aware and can accept today. Daughters requesting [facility] to transport. LCSW to arrange. D/C summary sent to SNF. Authorization received. Will give RN number to call report.      Final next level of care: Skilled Nursing Facility Barriers to Discharge: Barriers Resolved   Patient Goals and CMS Choice   Choice offered to / list presented to : Adult Children, Patient  Discharge Placement     Existing PASRR number confirmed : 07/07/23          Patient chooses bed at: Other - please specify in the comment section below: [facility]) Patient to be transferred to facility by: [facility] Name of family member notified: Rosey Bath- daughter Patient and family notified of of transfer: 07/07/23  Discharge Plan and Services Additional resources added to the After Visit Summary for                                       Social Determinants of Health (SDOH) Interventions SDOH Screenings   Food Insecurity: No Food Insecurity (07/04/2023)  Housing: Low Risk  (07/04/2023)  Transportation Needs: No Transportation Needs (07/04/2023)  Utilities: Not At Risk (07/04/2023)  Alcohol Screen: Low Risk  (07/27/2022)  Depression (PHQ2-9): Low Risk  (07/27/2022)  Financial Resource Strain: Low Risk  (07/27/2022)  Physical Activity: Inactive (07/27/2022)  Social Connections: Moderately Isolated (07/27/2022)  Stress: No Stress Concern Present (07/27/2022)  Tobacco Use: High Risk (07/04/2023)      Readmission Risk Interventions     No data to display

## 2023-07-07 NOTE — Progress Notes (Signed)
Patient ID: Travis Murphy, male   DOB: 21-Feb-1935, 88 y.o.   MRN: [id number]  POD 3   BP (!) 111/58 (BP Location: Right Arm)   Pulse 97   Temp 98.2 F (36.8 C) (Oral)   Resp 20   Ht 5\' 6"  (1.676 m)   Wt 120 lb (54.4 kg)   SpO2 95%   BMI 19.37 kg/m   Daughters indicate patient a little sluggish today had a long day yesterday with a lot of visitors seems to be less energetic today  There has been some difficulty monitoring his oxygen saturation as he gets 2 different readings from the ear and the finger  At home the patient does not use oxygen although it was prescribed  I am thinking he may be getting too much oxygen trying to titrate him up to 95% so I am going to recommend we go to 2 L titrate him to 92% and use the ear monitor for the O2 sat  There is no orthopedic indication for further acute admission but medically, the medical staff will have to decide on that  As far as we are concerned he should follow-up in about 4 weeks for x-rays and wound check  His weightbearing status is as tolerated his DVT prevention is aspirin

## 2023-07-07 NOTE — Progress Notes (Addendum)
Chest x-ray completed, waiting on results. Patient complaining of shortness of breath and noted elevated heart rate. Family reported patient stated his heart was racing. Patient did not verbalize to this writer heart was racing but that he was short of breath. Patients oxygen saturation 92% on 3 liters, heart rate 123. MD Gwenlyn Perking made aware. RT notified. Patient given one time dose of Lopressor 2.5 mg at 1804, family at bedside.

## 2023-07-07 NOTE — Progress Notes (Signed)
   07/07/23 1300  Assess: MEWS Score  Temp 97.7 F (36.5 C)  BP (!) 90/53  MAP (mmHg) (!) 63  Pulse Rate (!) 108  SpO2 95 %  O2 Device Nasal Cannula  O2 Flow Rate (L/min) 2 L/min  Assess: MEWS Score  MEWS Temp 0  MEWS Systolic 1  MEWS Pulse 1  MEWS RR 0  MEWS LOC 0  MEWS Score 2  MEWS Score Color Yellow  Assess: if the MEWS score is Yellow or Red  Were vital signs accurate and taken at a resting state? Yes  Does the patient meet 2 or more of the SIRS criteria? Yes  Does the patient have a confirmed or suspected source of infection? No  MEWS guidelines implemented  Yes, yellow  Treat  MEWS Interventions Considered administering scheduled or prn medications/treatments as ordered  Take Vital Signs  Increase Vital Sign Frequency  Yellow: Q2hr x1, continue Q4hrs until patient remains green for 12hrs  Escalate  MEWS: Escalate Yellow: Discuss with charge nurse and consider notifying provider and/or RRT  Notify: Charge Nurse/RN  Name of Charge Nurse/RN Notified [place] RN  Provider Notification  Provider Name/Title MD Gwenlyn Perking  Date Provider Notified 07/07/23  Time Provider Notified 1310  Method of Notification Page (Secure chat)  Notification Reason Other (Comment) (yellow MEWs)  Provider response No new orders  Date of Provider Response 07/07/23  Time of Provider Response 1338  Assess: SIRS CRITERIA  SIRS Temperature  0  SIRS Pulse 1  SIRS Respirations  0  SIRS WBC 0  SIRS Score Sum  1

## 2023-07-07 NOTE — Progress Notes (Signed)
Tele called patients oxygen saturation 85% at 2 liters. Noted patient had oxygen sensor on ear and finger, one connected to tele monitor other to vital sign machine. Rt called stated reading on ear was more correct due to poor perfusion on fingers. Rechecked oxygen saturation was 95% at 2 liters. MD Vonda Antigua made aware. No new orders.

## 2023-07-07 NOTE — Progress Notes (Signed)
   07/07/23 1847  Assess: MEWS Score  Temp (!) 101.4 F (38.6 C)  BP (!) 92/51  MAP (mmHg) (!) 64  Pulse Rate (!) 110  SpO2 93 %  O2 Device Nasal Cannula  O2 Flow Rate (L/min) 3 L/min  Assess: MEWS Score  MEWS Temp 1  MEWS Systolic 1  MEWS Pulse 1  MEWS RR 1  MEWS LOC 0  MEWS Score 4  MEWS Score Color Red  Assess: if the MEWS score is Yellow or Red  Were vital signs accurate and taken at a resting state? Yes (Simultaneous filing. User may not have seen previous data.)  Does the patient meet 2 or more of the SIRS criteria? Yes (Simultaneous filing. User may not have seen previous data.)  Does the patient have a confirmed or suspected source of infection? Yes (Simultaneous filing. User may not have seen previous data.)  MEWS guidelines implemented  Yes, red (Simultaneous filing. User may not have seen previous data.)  Treat  MEWS Interventions Considered administering scheduled or prn medications/treatments as ordered (Simultaneous filing. User may not have seen previous data.)  Take Vital Signs  Increase Vital Sign Frequency  Red: Q1hr x2, continue Q4hrs until patient remains green for 12hrs (Simultaneous filing. User may not have seen previous data.)  Escalate  MEWS: Escalate Red: Discuss with charge nurse and notify provider. Consider notifying RRT. If remains red for 2 hours consider need for higher level of care (Simultaneous filing. User may not have seen previous data.)  Notify: Charge Nurse/RN  Name of Charge Nurse/RN Notified [place] RN  Provider Notification  Provider Name/Title Vassie Loll (Simultaneous filing. User may not have seen previous data.)  Date Provider Notified 07/07/23 (Simultaneous filing. User may not have seen previous data.)  Time Provider Notified 1850 (Simultaneous filing. User may not have seen previous data.)  Method of Notification Page (Simultaneous filing. User may not have seen previous data.)  Notification Reason Change in status   Provider response Other (Comment) (Admin Tylenol & follow previously entered orders)  Date of Provider Response 07/07/23  Time of Provider Response 1856  Assess: SIRS CRITERIA  SIRS Temperature  1  SIRS Pulse 1  SIRS Respirations  1  SIRS WBC 0  SIRS Score Sum  3    VS as above, Tylenol given per order. MD notified and advises to follow previously entered orders and update provider if worsening symptoms.

## 2023-07-07 NOTE — Progress Notes (Addendum)
Family concerned about patient, noted patient sweaty. Vital signs: T-97.7, R-20, BP-90/53, P-108, O2-95% at 2 liters. Patient given oral midodrine. Family stated they were told that patient needed to see urologist, patient normally is cold. Noted WBC elevated from yesterday, know labs obtained today. MD Gwenlyn Perking made aware. No new orders.

## 2023-07-08 ENCOUNTER — Encounter (HOSPITAL_COMMUNITY): Payer: Self-pay | Admitting: Internal Medicine

## 2023-07-08 ENCOUNTER — Other Ambulatory Visit (HOSPITAL_COMMUNITY): Payer: Self-pay | Admitting: *Deleted

## 2023-07-08 ENCOUNTER — Inpatient Hospital Stay (HOSPITAL_COMMUNITY): Payer: Medicare Other

## 2023-07-08 ENCOUNTER — Other Ambulatory Visit: Payer: Self-pay

## 2023-07-08 DIAGNOSIS — E782 Mixed hyperlipidemia: Secondary | ICD-10-CM | POA: Diagnosis not present

## 2023-07-08 DIAGNOSIS — I2609 Other pulmonary embolism with acute cor pulmonale: Secondary | ICD-10-CM | POA: Diagnosis not present

## 2023-07-08 DIAGNOSIS — Z515 Encounter for palliative care: Secondary | ICD-10-CM | POA: Diagnosis not present

## 2023-07-08 DIAGNOSIS — S72002D Fracture of unspecified part of neck of left femur, subsequent encounter for closed fracture with routine healing: Secondary | ICD-10-CM | POA: Diagnosis not present

## 2023-07-08 DIAGNOSIS — I2699 Other pulmonary embolism without acute cor pulmonale: Secondary | ICD-10-CM

## 2023-07-08 DIAGNOSIS — J189 Pneumonia, unspecified organism: Secondary | ICD-10-CM

## 2023-07-08 DIAGNOSIS — Z7189 Other specified counseling: Secondary | ICD-10-CM | POA: Diagnosis not present

## 2023-07-08 DIAGNOSIS — N35014 Post-traumatic urethral stricture, male, unspecified: Secondary | ICD-10-CM

## 2023-07-08 DIAGNOSIS — I480 Paroxysmal atrial fibrillation: Secondary | ICD-10-CM

## 2023-07-08 DIAGNOSIS — J9621 Acute and chronic respiratory failure with hypoxia: Secondary | ICD-10-CM

## 2023-07-08 DIAGNOSIS — S72142A Displaced intertrochanteric fracture of left femur, initial encounter for closed fracture: Secondary | ICD-10-CM | POA: Diagnosis not present

## 2023-07-08 DIAGNOSIS — N471 Phimosis: Secondary | ICD-10-CM | POA: Diagnosis not present

## 2023-07-08 DIAGNOSIS — R339 Retention of urine, unspecified: Secondary | ICD-10-CM | POA: Diagnosis not present

## 2023-07-08 DIAGNOSIS — J441 Chronic obstructive pulmonary disease with (acute) exacerbation: Secondary | ICD-10-CM

## 2023-07-08 DIAGNOSIS — E43 Unspecified severe protein-calorie malnutrition: Secondary | ICD-10-CM

## 2023-07-08 DIAGNOSIS — I10 Essential (primary) hypertension: Secondary | ICD-10-CM | POA: Diagnosis not present

## 2023-07-08 LAB — BLOOD GAS, ARTERIAL
Acid-base deficit: 0.4 mmol/L (ref 0.0–2.0)
Bicarbonate: 25.4 mmol/L (ref 20.0–28.0)
Drawn by: 35043
O2 Saturation: 97.7 %
Patient temperature: 36.7
pCO2 arterial: 44 mm[Hg] (ref 32–48)
pH, Arterial: 7.36 (ref 7.35–7.45)
pO2, Arterial: 85 mm[Hg] (ref 83–108)

## 2023-07-08 LAB — LACTIC ACID, PLASMA
Lactic Acid, Venous: 1.4 mmol/L (ref 0.5–1.9)
Lactic Acid, Venous: 1.2 mmol/L (ref 0.5–1.9)

## 2023-07-08 LAB — ECHOCARDIOGRAM COMPLETE
AR max vel: 1.53 cm2
AV Area VTI: 1.49 cm2
AV Area mean vel: 1.5 cm2
AV Mean grad: 12 mm[Hg]
AV Peak grad: 21.7 mm[Hg]
Ao pk vel: 2.33 m/s
Area-P 1/2: 2.76 cm2
Height: 66 in
MV VTI: 1.85 cm2
P 1/2 time: 344 ms
S' Lateral: 2.7 cm
Weight: 1920 [oz_av]

## 2023-07-08 LAB — URINALYSIS, MICROSCOPIC (REFLEX)
RBC / HPF: NONE SEEN RBC/hpf (ref 0–5)
Squamous Epithelial / HPF: NONE SEEN /[HPF] (ref 0–5)
WBC, UA: NONE SEEN WBC/hpf (ref 0–5)

## 2023-07-08 LAB — URINALYSIS, ROUTINE W REFLEX MICROSCOPIC
Bilirubin Urine: NEGATIVE
Glucose, UA: NEGATIVE mg/dL
Hgb urine dipstick: NEGATIVE
Ketones, ur: NEGATIVE mg/dL
Leukocytes,Ua: NEGATIVE
Nitrite: NEGATIVE
Protein, ur: 100 mg/dL — AB
Specific Gravity, Urine: 1.02 (ref 1.005–1.030)
pH: 5.5 (ref 5.0–8.0)

## 2023-07-08 LAB — HEPARIN LEVEL (UNFRACTIONATED): Heparin Unfractionated: <0.1 [IU]/mL — ABNORMAL LOW (ref 0.30–0.70)

## 2023-07-08 LAB — PROCALCITONIN: Procalcitonin: 2.01 ng/mL

## 2023-07-08 MED ORDER — METHYLPREDNISOLONE SODIUM SUCC 125 MG IJ SOLR
125.0000 mg | Freq: Two times a day (BID) | INTRAMUSCULAR | Status: DC
  Administered 2023-07-08 – 2023-07-09 (×2): 125 mg via INTRAVENOUS
  Filled 2023-07-08 (×2): qty 2

## 2023-07-08 MED ORDER — HEPARIN BOLUS VIA INFUSION
1500.0000 [IU] | Freq: Once | INTRAVENOUS | Status: AC
Start: 2023-07-08 — End: 2023-07-08
  Administered 2023-07-08: 1500 [IU] via INTRAVENOUS
  Filled 2023-07-08: qty 1500

## 2023-07-08 MED ORDER — SODIUM CHLORIDE 0.9% FLUSH
10.0000 mL | Freq: Two times a day (BID) | INTRAVENOUS | Status: DC
  Administered 2023-07-08 – 2023-07-09 (×3): 10 mL
  Administered 2023-07-10: 20 mL
  Administered 2023-07-11: 10 mL

## 2023-07-08 MED ORDER — BISACODYL 10 MG RE SUPP: 10.0000 mg | Freq: Every day | RECTAL | Status: DC | PRN

## 2023-07-08 MED ORDER — SODIUM CHLORIDE 0.9 % IV BOLUS
1000.0000 mL | Freq: Once | INTRAVENOUS | Status: AC
  Administered 2023-07-08: 1000 mL via INTRAVENOUS

## 2023-07-08 MED ORDER — METHYLPREDNISOLONE SODIUM SUCC 40 MG IJ SOLR
40.0000 mg | Freq: Two times a day (BID) | INTRAMUSCULAR | Status: DC
  Administered 2023-07-08: 40 mg via INTRAVENOUS

## 2023-07-08 MED ORDER — PIPERACILLIN-TAZOBACTAM 3.375 G IVPB: 3.3750 g | Freq: Three times a day (TID) | INTRAVENOUS | Status: DC

## 2023-07-08 MED ORDER — HEPARIN (PORCINE) 25000 UT/250ML-% IV SOLN
1650.0000 [IU]/h | INTRAVENOUS | Status: DC
  Administered 2023-07-08: 800 [IU]/h via INTRAVENOUS
  Administered 2023-07-09: 1200 [IU]/h via INTRAVENOUS
  Administered 2023-07-10 – 2023-07-11 (×2): 1550 [IU]/h via INTRAVENOUS
  Filled 2023-07-08 (×5): qty 250

## 2023-07-08 MED ORDER — SODIUM CHLORIDE 0.9 % IV SOLN
250.0000 mL | INTRAVENOUS | Status: AC
  Administered 2023-07-09: 250 mL via INTRAVENOUS

## 2023-07-08 MED ORDER — SODIUM CHLORIDE 0.9% FLUSH: 10.0000 mL | INTRAVENOUS | Status: DC | PRN

## 2023-07-08 MED ORDER — PIPERACILLIN-TAZOBACTAM 3.375 G IVPB
3.3750 g | Freq: Three times a day (TID) | INTRAVENOUS | Status: DC
  Administered 2023-07-08 – 2023-07-11 (×11): 3.375 g via INTRAVENOUS
  Filled 2023-07-08 (×12): qty 50

## 2023-07-08 MED ORDER — VANCOMYCIN HCL 1250 MG/250ML IV SOLN
1250.0000 mg | Freq: Once | INTRAVENOUS | Status: AC
  Administered 2023-07-08: 1250 mg via INTRAVENOUS
  Filled 2023-07-08: qty 250

## 2023-07-08 MED ORDER — HEPARIN BOLUS VIA INFUSION
2000.0000 [IU] | Freq: Once | INTRAVENOUS | Status: AC
  Administered 2023-07-08: 2000 [IU] via INTRAVENOUS
  Filled 2023-07-08: qty 2000

## 2023-07-08 NOTE — Progress Notes (Signed)
Progress Note   Patient: Travis Murphy KVQ:[id number] DOB: Dec 09, 1934 DOA: 07/03/2023     5 DOS: the patient was seen and examined on 07/08/2023   Brief hospital admission course: As per H&P written by Dr. Randol Kern on 07/03/2023 Travis Murphy  is a 88 y.o. male, past medical history of COPD, on 2 L nasal cannula at home intermittently, severe aortic stenosis status post TAVR, hypertension, dementia. -Presents to ED secondary to fall, and left hip pain, patient was walking out of convenience store today, when he fell, he denies feeling weak, dizzy, no loss of consciousness, he did report he did hit his head, but he is not on any blood thinners, he was able to stand with assistance from bystanders, and he was put into his daughter's car, who was able to get him home, but he was in too much pain, to get out of car, so she brought him to ED, patient had another fall recently, last week, but he denies any trauma.  Patient is on hospice for COPD -ED his workup significant for mild leukocytosis at 13.9, he was afebrile, his chest x-ray significant for chronic COPD, his left hip x-ray significant for mildly displaced left intertrochanteric fracture, ED physician discussed with Dr. Romeo Apple who requested admission.   Assessment and Plan: 1-left hip fracture -Secondary to mechanical fall. -Pending evaluation by orthopedic service with anticipated surgical intervention later today -Continue as needed analgesics -Status post intramedullary nail to left hip to stabilize fracture without significant complications. -Following postoperative care by orthopedic service okay to weight-bear as tolerated -Given new diagnosis of pulmonary embolism will be on Eliquis as part of treatment for PE and DVT prophylaxis. -They will follow patient up after discharge in the next 2 weeks. -Physical therapy has seen patient and is recommending skilled nursing facility for rehabilitation and conditioning; and the  patient was pending transfer for rehabilitation to skilled nursing facility on 07/07/2023, prior to decompensating.  2-acute on chronic respiratory failure with hypoxia/COPD exacerbation/bronchiectasis and pneumonia. -Positive tachypnea -Following CT chest findings of extensive pneumonia and emphysematous changes; steroids and broad-spectrum antibiotic has been started. -Continue bronchodilator management and the use of mucolytic's. -Continue oxygen supplementation (chronically 2-3 L at baseline). -Patient has emphasized once again not wanting to be intubated if required; okay to use BiPAP if needed.  3-dementia -Continue Aricept  -No behavioral disturbances currently appreciated. -Continue constant reorientation and supportive care.Marland Kitchen    4-history of paroxysmal atrial fibrillation; now with A-fib with RVR -Patient required initiation of amiodarone drip for rate control -Soft blood pressure limiting the use of other rate controlling agents. -Continue to follow heart rate -Stabilize electrolytes as needed with a goal potassium above 4 and magnesium above 2.  5-gastroesophageal flux disease -Continue PPI.  6-chronic diastolic heart failure -Euvolemic, stable and compensated -Continue to follow daily weights/I's and O's. -Low-sodium diet discussed with patient.  7-history of severe aortic stenosis -Status post TAVR -Appears to be stable and well compensated with repeat echo in December 2023 demonstrating no presence of aortic stenosis after surgical intervention. -Patient aortic valve mean gradient measures 9.5 mmHg -Continue patient follow-up with cardiology service.  8-constipation -Will continue the use of MiraLAX and Colace -PRN dulcolax suppository for severe constipation. -Lack of movement and as needed analgesics most likely playing a role.  10-pulmonary embolism/hypertension -Continue pressor support -Continue heparin drip -Once medically stable transition to Eliquis for  PE treatment. -Wean off pressors support as tolerated.  11-social/ethics: -palliative care consulted -patient is DNR/DNI; ok for BIPAP -  continue treat acutely.  CRITICAL CARE Performed by: Vassie Loll   Total critical care time: 60 minutes  Critical care time was exclusive of separately billable procedures and treating other patients.  Critical care was necessary to treat or prevent imminent or life-threatening deterioration.  Critical care was time spent personally by me on the following activities: development of treatment plan with patient and/or surrogate as well as nursing, discussions with consultants, evaluation of patient's response to treatment, examination of patient, obtaining history from patient or surrogate, ordering and performing treatments and interventions, ordering and review of laboratory studies, ordering and review of radiographic studies, pulse oximetry and re-evaluation of patient's condition.    Subjective:  Significant decline overnight with increased tachypnea, respiratory failure, hypoxia and requirement of transient use of BiPAP, pressors to maintain BP and development of A-fib with RVR.  Physical Exam: Vitals:   07/08/23 1200 07/08/23 1400 07/08/23 1411 07/08/23 1618  BP: (!) 88/37 (!) 102/59 (!) 102/59   Pulse: 94 (!) 59    Resp: 17 19    Temp:    97.6 F (36.4 C)  TempSrc:    Axillary  SpO2: 96% 96%    Weight:      Height:       General exam: Alert, awake, oriented x 3; not complaining of any pain at the moment, no nausea or vomiting.  Soft blood pressure requiring pressor support.  Currently afebrile. Respiratory system: Mild expiratory wheezing; posterior rhonchi, no frank crackles.  Good saturation on 3 L nasal cannula supplementation. Cardiovascular system: Rate controlled, no rubs, no gallops, no JVD on exam. Gastrointestinal system: Abdomen is nondistended, soft and nontender. No organomegaly or masses felt. Normal bowel sounds  heard. Central nervous system: No focal neurological deficits. Extremities: No cyanosis or clubbing; no edema. Skin: No petechiae; left hip wound clean and intact; dressings in place. Psychiatry: Mood & affect appropriate.   Data Reviewed: Lactic acid: 1.2 Procalcitonin: 2.01 Troponin: 34 Arterial blood gas: pH 7.36, CO2 44, pO2 85 and pO2 85. CT angiogram: Positive for extensive erythematous changes, bibasilar bronchiectasis/pneumonia and positive pulmonary embolism.   Family Communication: Daughters and significant other at bedside.  Disposition: Status is: Inpatient Remains inpatient appropriate because: Status post intramedullary nail intertrochanteric on 07/05/2023; continue postoperative care and new findings of A. Fib, PE, PNA and COPD exacerbation.  Planned Discharge Destination: Patient will need SNF for rehab after medically clear.    Author: Vassie Loll, MD 07/08/2023 4:21 PM  For on call review [email or web address].

## 2023-07-08 NOTE — Progress Notes (Signed)
PHARMACY - ANTICOAGULATION CONSULT NOTE  Pharmacy Consult for heparin Indication: pulmonary embolus  No Known Allergies  Patient Measurements: Height: 5\' 6"  (167.6 cm) Weight: 54.4 kg (120 lb) IBW/kg (Calculated) : 63.8  Vital Signs: Temp: 97.7 F (36.5 C) (11/12 0415) Temp Source: Axillary (11/12 0415) BP: 77/47 (11/12 0535) Pulse Rate: 90 (11/12 0535)  Labs: Recent Labs    07/06/23 0318 07/07/23 2002  HGB 11.6*  --   HCT 36.4*  --   PLT 157  --   TROPONINIHS  --  34*    Estimated Creatinine Clearance: 39.6 mL/min (by C-G formula based on SCr of 1.03 mg/dL).   Medical History: Past Medical History:  Diagnosis Date   Arthritis    Essential hypertension    Hyperlipemia    Hyperplastic colon polyp 2002   Dr. Lovell Sheehan   Pulmonary nodule    a. right lower lobe. 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 severe AS by echo in 2017 b. 01/2018: repeat echo showing EF of 55-60%, Grade 2 DD, and severe AS with mean gradient of 58 mm Hg   Vertigo     Medications:  Medications Prior to Admission  Medication Sig Dispense Refill Last Dose   albuterol (PROVENTIL) (2.5 MG/3ML) 0.083% nebulizer solution TAKE 3 ML (2.5 MG TOTAL) BY NEBULIZATION EVERY 4 HOURS AS NEEDED FOR WHEEZING OR SHORTNESS OF BREATH 75 mL 2 07/03/2023   aspirin EC 81 MG tablet Take 1 tablet (81 mg total) by mouth daily with breakfast. 30 tablet 11 07/03/2023 at 0815   atorvastatin (LIPITOR) 10 MG tablet Take 10 mg by mouth daily.  4 07/01/2023   budesonide (PULMICORT) 0.25 MG/2ML nebulizer solution One vial twice daily with albuterol 60 mL 12 07/03/2023   dextromethorphan-guaiFENesin (MUCINEX DM) 30-600 MG 12hr tablet Take 1 tablet by mouth 2 (two)  times daily.   07/03/2023   donepezil (ARICEPT) 10 MG tablet Take 10 mg by mouth every morning.   07/03/2023   famotidine (PEPCID) 20 MG tablet One after supper 30 tablet 11 07/01/2023   midodrine (PROAMATINE) 10 MG tablet Take 1 tablet (10 mg total) by mouth 3 (three) times daily with meals. 90 tablet 1 07/03/2023   Nebulizers (COMPRESSOR/NEBULIZER) MISC 1 Units by Does not apply route daily as needed. 1 each 0 07/03/2023   pantoprazole (PROTONIX) 40 MG tablet TAKE 1 TABLET (40 MG TOTAL) BY MOUTH DAILY. TAKE 30-60 MIN BEFORE FIRST MEAL OF THE DAY 90 tablet 0 07/03/2023   tamsulosin (FLOMAX) 0.4 MG CAPS capsule Take 0.8 mg by mouth daily.   07/01/2023   vitamin B-12 (CYANOCOBALAMIN) 1000 MCG tablet Take 1,000 mcg by mouth daily.   07/03/2023   Scheduled:   acetaminophen  500 mg Oral Once   arformoterol  15 mcg Nebulization BID   aspirin EC  325 mg Oral Q breakfast   atorvastatin  10 mg Oral Daily   budesonide  0.5 mg Nebulization BID   Chlorhexidine Gluconate Cloth  6 each Topical Daily   cyanocobalamin  1,000 mcg Oral Daily   dextromethorphan-guaiFENesin  1 tablet Oral BID   docusate sodium  100 mg Oral BID   donepezil  10 mg Oral q morning   famotidine  20 mg Oral Daily   feeding supplement  237 mL Oral BID BM   ipratropium  0.5 mg Nebulization Q8H   methylPREDNISolone (SOLU-MEDROL) injection  40 mg Intravenous Q12H   midodrine  10 mg Oral TID WC   pantoprazole  40 mg Oral Daily   polyethylene glycol  17 g Oral Daily   tamsulosin  0.8 mg Oral Daily   traMADol  50 mg Oral Once   traMADol  50 mg Oral Q6H   Infusions:   sodium chloride 150 mL/hr at 07/08/23 0537   sodium chloride 10 mL/hr at 07/08/23 0256   amiodarone 30 mg/hr (07/08/23 0537)   phenylephrine (NEO-SYNEPHRINE) Adult infusion 195 mcg/min (07/08/23 0537)   piperacillin-tazobactam 3.375 g (07/08/23 0546)    Assessment: 88yo male had been set for hospital discharge on 11/11 after stay for a hip fracture and had ortho surgery  on 11/8 with post-op DVT Px w/ ASA; respiratory status changed prior to discharge and pt had CXR and CTA which reveals extensive PNA and less-significant acute PE, already started on ABX, now to start heparin.  Goal of Therapy:  Heparin level 0.3-0.7 units/ml Monitor platelets by anticoagulation protocol: Yes   Plan:  Heparin 2000 units IV bolus x1 followed by infusion at 800 units/hr. Monitor heparin levels and CBC.  Vernard Gambles, PharmD, BCPS  07/08/2023,5:46 AM

## 2023-07-08 NOTE — Consult Note (Signed)
Consultation Note Date: 07/08/2023   Patient Name: Travis Murphy  DOB: 15-May-1935  MRN: [id number]  Age / Sex: 88 y.o., male  PCP: Travis Perches, MD Referring Physician: Vassie Loll, MD  Reason for Consultation: Establishing goals of care  HPI/Patient Profile: 88 y.o. male  with past medical history of ORIF left hip fracture 11/8, COPD 2 L, arthritis, HTN/HLD, hyperplastic colon polyp in 2002, TAVR 2019 secondary to severe aortic stenosis, admitted on 07/03/2023 with hip fracture.   Clinical Assessment and Goals of Care: I have reviewed medical records including EPIC notes, labs and imaging, received report from RN, assessed the patient.  Travis Murphy is resting quietly in bed.  He appears acutely/chronically ill and somewhat frail.  He greets me, making and mostly keeping eye contact.  He is alert and oriented, able to make his needs known.  There is no family at bedside at this time although bedside nursing staff is present attending to needs.    Travis Murphy tells me that he is agreeable to continue to treat the treatable.  He would except further medical management of his acute health concerns.  He shares that he is also at peace about where he is and when his time comes to the past he is okay.  He is agreeable for me to speak with his family.  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  Conversation with daughters Travis Murphy and Travis Murphy in the family waiting room.  Multiple unnamed family members are present.  We meet to discuss diagnosis prognosis, GOC, EOL wishes, disposition and options.  I introduced Palliative Medicine as specialized medical care for people living with serious illness. It focuses on providing relief from the symptoms and stress of a serious illness. The goal is to improve quality of life for both the patient and the family.  Travis Murphy had  been active with [facility] hospice services for "treat the treatable" hospice care.  When he developed difficulty walking family was encouraged to seek assistance in the emergency department.  We focused on their current illness.  We talk about his low blood pressure and vasopressors.  We talk about his urinary retention and catheter.  We talk about possible central line.  Daughters shared that they would like to have detailed discussions with your father about what he does and does not want.  They share that he has said in the past that he would not want to live in a wheelchair.   The natural disease trajectory and expectations at EOL were discussed.  Advanced directives, concepts specific to code status, artifical feeding and hydration, and rehospitalization were considered and discussed.  DNR verified with patient and family.  Discussed the importance of continued conversation with family and the medical providers regarding overall plan of care and treatment options, ensuring decisions are within the context of the patient's values and GOCs.  Questions and concerns were addressed.  The family was encouraged to call with questions or concerns.  PMT will  continue to support holistically.  Conference with attending, bedside nursing staff, transition of care team related to patient condition, needs, goals of care, disposition.   HCPOA  NEXT OF KIN -2 daughters shared decision making, Travis Murphy and Travis Murphy.    SUMMARY OF RECOMMENDATIONS   At this point continue to treat the treatable but no CPR or intubation Time for outcomes Considering central line placement   Code Status/Advance Care Planning: DNR -verified with patient and family  Symptom Management:  Per hospitalist, no additional needs at this time.  Palliative Prophylaxis:  Frequent Pain Assessment, Oral Care, and Turn Reposition  Additional Recommendations (Limitations, Scope, Preferences): Continue to treatment no CPR or  intubation  Psycho-social/Spiritual:  Desire for further Chaplaincy support:no Additional Recommendations: Caregiving  Support/Resources  Prognosis:  Unable to determine, based on outcomes.  Discharge Planning: To Be Determined      Primary Diagnoses: Present on Admission:  Hip fracture (HCC)  Chronic respiratory failure with hypoxia (HCC)  Essential hypertension  Hyperlipemia  New onset a-fib (HCC)   I have reviewed the medical record, interviewed the patient and family, and examined the patient. The following aspects are pertinent.  Past Medical History:  Diagnosis Date   Arthritis    Essential hypertension    Hyperlipemia    Hyperplastic colon polyp 2002   Dr. Lovell Murphy   Pulmonary nodule    a. right lower lobe. 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 severe AS by echo in 2017 b. 01/2018: repeat echo showing EF of 55-60%, Grade 2 DD, and severe AS with mean gradient of 58 mm Hg   Vertigo    Social History   Socioeconomic History   Marital status: Widowed    Spouse name: Not on file   Number of children: 2   Years of education: 13   Highest education level: 12th grade  Occupational History   Occupation: retired; Health and safety inspector  Tobacco Use   Smoking status: Every Day    Current packs/day: 1.00    Average packs/day: 1 pack/day for 65.0 years (65.0 ttl pk-yrs)    Types: Cigarettes    Passive exposure: Current   Smokeless tobacco: Never   Tobacco comments:    Verified by Daughter - Travis Murphy    Smoking Cessation Offered.  Vaping Use   Vaping status: Never Used  Substance and Sexual Activity   Alcohol use: Not Currently    Alcohol/week: 0.0 standard drinks of alcohol   Drug use: Never    Sexual activity: Not Currently    Partners: Female  Other Topics Concern   Not on file  Social History Narrative   Widower (married for 53yrs); Remarried in 2016      Social Determinants of Health   Financial Resource Strain: Low Risk  (07/27/2022)   Overall Financial Resource Strain (CARDIA)    Difficulty of Paying Living Expenses: Not hard at all  Food Insecurity: No Food Insecurity (07/04/2023)   Hunger Vital Sign    Worried About Running Out of Food in the Last Year: Never true    Ran Out of Food in the Last Year: Never true  Transportation Needs: No Transportation Needs (07/04/2023)   PRAPARE - Transportation    Lack  of Transportation (Medical): No    Lack of Transportation (Non-Medical): No  Physical Activity: Inactive (07/27/2022)   Exercise Vital Sign    Days of Exercise per Week: 0 days    Minutes of Exercise per Session: 0 min  Stress: No Stress Concern Present (07/27/2022)   [employer] of Occupational Health - Occupational Stress Questionnaire    Feeling of Stress : Not at all  Social Connections: Moderately Isolated (07/27/2022)   Social Connection and Isolation Panel [NHANES]    Frequency of Communication with Friends and Family: More than three times a week    Frequency of Social Gatherings with Friends and Family: More than three times a week    Attends Religious Services: More than 4 times per year    Active Member of [employer] or Organizations: No    Attends Banker Meetings: Never    Marital Status: Widowed   Family History  Problem Relation Age of Onset   Stroke Mother    Heart attack Father    Liver disease Sister    Renal Disease Brother    Scheduled Meds:  acetaminophen  500 mg Oral Once   arformoterol  15 mcg Nebulization BID   aspirin EC  325 mg Oral Q breakfast   atorvastatin  10 mg Oral Daily   budesonide  0.5 mg Nebulization BID   Chlorhexidine Gluconate Cloth  6 each Topical Daily   cyanocobalamin  1,000 mcg Oral Daily    dextromethorphan-guaiFENesin  1 tablet Oral BID   docusate sodium  100 mg Oral BID   donepezil  10 mg Oral q morning   famotidine  20 mg Oral Daily   feeding supplement  237 mL Oral BID BM   ipratropium  0.5 mg Nebulization Q8H   methylPREDNISolone (SOLU-MEDROL) injection  125 mg Intravenous Q12H   midodrine  10 mg Oral TID WC   pantoprazole  40 mg Oral Daily   polyethylene glycol  17 g Oral Daily   tamsulosin  0.8 mg Oral Daily   traMADol  50 mg Oral Q6H   Continuous Infusions:  sodium chloride 150 mL/hr at 07/08/23 0800   sodium chloride 10 mL/hr at 07/08/23 0256   amiodarone 30 mg/hr (07/08/23 1343)   heparin 800 Units/hr (07/08/23 0639)   phenylephrine (NEO-SYNEPHRINE) Adult infusion 200 mcg/min (07/08/23 1327)   piperacillin-tazobactam 3.375 g (07/08/23 1327)   PRN Meds:.acetaminophen, Compressor/Nebulizer, HYDROcodone-acetaminophen, levalbuterol, menthol-cetylpyridinium **OR** phenol, methocarbamol **OR** methocarbamol (ROBAXIN) injection, metoCLOPramide **OR** metoCLOPramide (REGLAN) injection, morphine injection, ondansetron **OR** ondansetron (ZOFRAN) IV, polyethylene glycol Medications Prior to Admission:  Prior to Admission medications   Medication Sig Start Date End Date Taking? Authorizing Provider  albuterol (PROVENTIL) (2.5 MG/3ML) 0.083% nebulizer solution TAKE 3 ML (2.5 MG TOTAL) BY NEBULIZATION EVERY 4 HOURS AS NEEDED FOR WHEEZING OR SHORTNESS OF BREATH 05/12/23  Yes Nyoka Cowden, MD  aspirin EC 81 MG tablet Take 1 tablet (81 mg total) by mouth daily with breakfast. 08/27/22  Yes Emokpae, Courage, MD  atorvastatin (LIPITOR) 10 MG tablet Take 10 mg by mouth daily. 11/15/15  Yes [provider]  budesonide (PULMICORT) 0.25 MG/2ML nebulizer solution One vial twice daily with albuterol 02/20/23  Yes Nyoka Cowden, MD  dextromethorphan-guaiFENesin [facility] DM) 30-600 MG 12hr tablet Take 1 tablet by mouth 2 (two) times daily.   Yes [provider]   donepezil (ARICEPT) 10 MG tablet Take 10 mg by mouth every morning. 02/11/22  Yes [provider]  famotidine (PEPCID) 20 MG tablet  One after supper 03/06/21  Yes Nyoka Cowden, MD  midodrine (PROAMATINE) 10 MG tablet Take 1 tablet (10 mg total) by mouth 3 (three) times daily with meals. 08/27/22  Yes Emokpae, Courage, MD  Nebulizers (COMPRESSOR/NEBULIZER) MISC 1 Units by Does not apply route daily as needed. 08/27/22  Yes Emokpae, Courage, MD  pantoprazole (PROTONIX) 40 MG tablet TAKE 1 TABLET (40 MG TOTAL) BY MOUTH DAILY. TAKE 30-60 MIN BEFORE FIRST MEAL OF THE DAY 08/30/21  Yes Nyoka Cowden, MD  tamsulosin (FLOMAX) 0.4 MG CAPS capsule Take 0.8 mg by mouth daily.   Yes [provider]  vitamin B-12 (CYANOCOBALAMIN) 1000 MCG tablet Take 1,000 mcg by mouth daily.   Yes [provider]  acetaminophen (TYLENOL) 500 MG tablet Take 2 tablets (1,000 mg total) by mouth in the morning, at noon, and at bedtime. 07/07/23   Travis Loll, MD  aspirin EC 325 MG tablet Take 1 tablet (325 mg total) by mouth daily. After 30 days, ok to resume baby aspirin daily (81mg ) 07/07/23   Travis Loll, MD  menthol-cetylpyridinium (CEPACOL) 3 MG lozenge Take 1 lozenge (3 mg total) by mouth as needed for sore throat. 07/07/23   Travis Loll, MD  methocarbamol (ROBAXIN) 500 MG tablet Take 1 tablet (500 mg total) by mouth every 8 (eight) hours as needed for muscle spasms. 07/07/23   Travis Loll, MD  traMADol (ULTRAM) 50 MG tablet Take 1 tablet (50 mg total) by mouth every 12 (twelve) hours as needed for severe pain (pain score 7-10). 07/07/23   Travis Loll, MD   No Known Allergies Review of Systems  Unable to perform ROS: Age    Physical Exam Vitals and nursing note reviewed.  Constitutional:      General: He is not in acute distress.    Appearance: He is ill-appearing.  HENT:     Mouth/Throat:     Mouth: Mucous membranes are dry.  Cardiovascular:     Rate and Rhythm: Normal rate.   Pulmonary:     Effort: Pulmonary effort is normal. No respiratory distress.  Skin:    General: Skin is warm and dry.  Neurological:     Mental Status: He is alert and oriented to person, place, and time.  Psychiatric:        Mood and Affect: Mood normal.        Behavior: Behavior normal.     Vital Signs: BP (!) 102/59   Pulse (!) 59   Temp 97.7 F (36.5 C) (Axillary)   Resp 19   Ht 5\' 6"  (1.676 m)   Wt 54.4 kg   SpO2 96%   BMI 19.37 kg/m  Pain Scale: 0-10 POSS *See Group Information*: 1-Acceptable,Awake and alert Pain Score: 0-No pain   SpO2: SpO2: 96 % O2 Device:SpO2: 96 % O2 Flow Rate: .O2 Flow Rate (L/min): 4 L/min  IO: Intake/output summary:  Intake/Output Summary (Last 24 hours) at 07/08/2023 1500 Last data filed at 07/08/2023 4782 Gross per 24 hour  Intake 3957.76 ml  Output 70 ml  Net 3887.76 ml    LBM: Last BM Date : 07/04/23 Baseline Weight: Weight: 54.4 kg Most recent weight: Weight: 54.4 kg     Palliative Assessment/Data:     Time In: 1000 Time Out: 1055 Time Total: 55 minutes  Greater than 50%  of this time was spent counseling and coordinating care related to the above assessment and plan.  Signed by: Katheran Awe, NP   Please contact Palliative Medicine  Team phone at [phone] for questions and concerns.  For individual provider: See Loretha Stapler

## 2023-07-08 NOTE — Progress Notes (Signed)
*  PRELIMINARY RESULTS* Echocardiogram 2D Echocardiogram has been performed.  Najah, Streater 07/08/2023, 10:22 AM

## 2023-07-08 NOTE — Progress Notes (Signed)
PT Cancellation Note  Patient Details Name: Travis Murphy MRN: [id number] DOB: 1934/12/23   Cancelled Treatment:    Reason Eval/Treat Not Completed: Medical issues which prohibited therapy. Patient transferred to a higher level of care and will need new PT consult to resume therapy when patient is medically stable.  Thank you.    7:43 AM, 07/08/23 [name], MPT Physical Therapist with [facility] [phone] office [phone] mobile phone

## 2023-07-08 NOTE — Progress Notes (Signed)
Pharmacy Antibiotic Note  Travis Murphy is a 88 y.o. male admitted on 07/03/2023 for hip fracture now with worsening respiratory status prior to discharge and concerns for sepsis.  Pharmacy has been consulted for Vancomycin dosing.  WBC 11.8, lactate 1.4, PCT 2.01, sCr 1.03, Tmax 101.4 Blood cultures pending Zosyn started  Plan: -Zosyn 3.375gm IV every 8 hours per MD -Vancomycin 1250mg  IV x1 -Vancomycin 750mg  IV every 24 hours (AUC 445, Vd 0.72, IBW) -Monitor renal function -Follow up signs of clinical improvement, LOT, de-escalation of antibiotics   Height: 5\' 6"  (167.6 cm) Weight: 54.4 kg (120 lb) IBW/kg (Calculated) : 63.8  Temp (24hrs), Avg:99 F (37.2 C), Min:97.7 F (36.5 C), Max:101.4 F (38.6 C)  Recent Labs  Lab 07/03/23 1229 07/04/23 0620 07/05/23 0340 07/06/23 0318 07/08/23 0243  WBC 13.9* 7.8 12.8* 11.8*  --   CREATININE 1.05 1.03  --   --   --   LATICACIDVEN  --   --   --   --  1.4    Estimated Creatinine Clearance: 39.6 mL/min (by C-G formula based on SCr of 1.03 mg/dL).    No Known Allergies  Antimicrobials this admission: Zosyn 11/12 >>  Vancomycin 11/12 >>   Microbiology results: 11/12 BCx:  11/12 MRSA PCR:   Thank you for allowing pharmacy to be a part of this patient's care.  Arabella Merles, PharmD. Clinical Pharmacist 07/08/2023 4:15 AM

## 2023-07-08 NOTE — Progress Notes (Signed)
Spoke with Primary RN regarding PICC placement, hold off on PICC at this time. IV/PICC team will follow up tomorrow 11/13 if PICC is still needed.

## 2023-07-08 NOTE — Progress Notes (Signed)
Nurse heard machines beeping, went to assess pt. Pt. Pulling at BIPAP mask, restless, and agitated requesting for the mask to be removed. Nurse removed BIPAP mask and placed pt. On HFNC at 6L. Pt.'s O2 sats 94. Nurse educated and explained to patient that we may have to place him back on BIPAP in the event of increased work of breathing, SOB, or if his O2 sats start to drop. Pt. Verbalized understanding and is okay with the plan. Nurse notified RT.

## 2023-07-08 NOTE — Progress Notes (Signed)
Peripherally Inserted Central Catheter Placement  The IV Nurse has discussed with the patient and/or persons authorized to consent for the patient, the purpose of this procedure and the potential benefits and risks involved with this procedure.  The benefits include less needle sticks, lab draws from the catheter, and the patient may be discharged home with the catheter. Risks include, but not limited to, infection, bleeding, blood clot (thrombus formation), and puncture of an artery; nerve damage and irregular heartbeat and possibility to perform a PICC exchange if needed/ordered by physician.  Alternatives to this procedure were also discussed.  Bard Power PICC patient education guide, fact sheet on infection prevention and patient information card has been provided to patient /or left at bedside.    PICC Placement Documentation  PICC Triple Lumen 07/08/23 Right Basilic 35 cm 0 cm (Active)  Indication for Insertion or Continuance of Line Vasoactive infusions 07/08/23 1830  Exposed Catheter (cm) 0 cm 07/08/23 1830  Site Assessment Clean, Dry, Intact 07/08/23 1830  Lumen #1 Status Flushed;Saline locked;Blood return noted 07/08/23 1830  Lumen #2 Status Flushed;Saline locked;Blood return noted 07/08/23 1830  Lumen #3 Status Flushed;Saline locked;Blood return noted 07/08/23 1830  Dressing Type Transparent;Securing device 07/08/23 1830  Dressing Status Antimicrobial disc in place;Clean, Dry, Intact 07/08/23 1830  Line Care Connections checked and tightened 07/08/23 1830  Line Adjustment (NICU/IV Team Only) No 07/08/23 1830  Dressing Intervention New dressing;Adhesive placed at insertion site (IV team only);Other (Comment) 07/08/23 1830  Dressing Change Due 07/15/23 07/08/23 1830    Patient's daughter, Derenda Mis, signed PICC consent at bedside prior to PICC insertion.   Annett Fabian 07/08/2023, 6:31 PM

## 2023-07-08 NOTE — Progress Notes (Signed)
CTA ordered at 9:30PM Radiology called with result 5:30am. Extensive pneumonia with less significant acute pulm emboli.

## 2023-07-08 NOTE — Progress Notes (Signed)
PHARMACY - ANTICOAGULATION CONSULT NOTE  Pharmacy Consult for heparin Indication: pulmonary embolus  No Known Allergies  Patient Measurements: Height: 5\' 6"  (167.6 cm) Weight: 54.4 kg (120 lb) IBW/kg (Calculated) : 63.8  Vital Signs: Temp: 97.7 F (36.5 C) (11/12 0415) Temp Source: Axillary (11/12 0415) BP: 102/59 (11/12 1411) Pulse Rate: 59 (11/12 1400)  Labs: Recent Labs    07/06/23 0318 07/07/23 2002 07/08/23 1431  HGB 11.6*  --   --   HCT 36.4*  --   --   PLT 157  --   --   HEPARINUNFRC  --   --  <0.10*  TROPONINIHS  --  34*  --     Estimated Creatinine Clearance: 39.6 mL/min (by C-G formula based on SCr of 1.03 mg/dL).   Medical History: Past Medical History:  Diagnosis Date   Arthritis    Essential hypertension    Hyperlipemia    Hyperplastic colon polyp 2002   Dr. Lovell Sheehan   Pulmonary nodule    a. right lower lobe. 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 severe AS by echo in 2017 b. 01/2018: repeat echo showing EF of 55-60%, Grade 2 DD, and severe AS with mean gradient of 58 mm Hg   Vertigo     Medications:  Medications Prior to Admission  Medication Sig Dispense Refill Last Dose   albuterol (PROVENTIL) (2.5 MG/3ML) 0.083% nebulizer solution TAKE 3 ML (2.5 MG TOTAL) BY NEBULIZATION EVERY 4 HOURS AS NEEDED FOR WHEEZING OR SHORTNESS OF BREATH 75 mL 2 07/03/2023   aspirin EC 81 MG tablet Take 1 tablet (81 mg total) by mouth daily with breakfast. 30 tablet 11 07/03/2023 at 0815   atorvastatin (LIPITOR) 10 MG tablet Take 10 mg by mouth daily.  4 07/01/2023   budesonide (PULMICORT) 0.25 MG/2ML nebulizer solution One vial twice daily with albuterol 60 mL 12 07/03/2023    dextromethorphan-guaiFENesin (MUCINEX DM) 30-600 MG 12hr tablet Take 1 tablet by mouth 2 (two) times daily.   07/03/2023   donepezil (ARICEPT) 10 MG tablet Take 10 mg by mouth every morning.   07/03/2023   famotidine (PEPCID) 20 MG tablet One after supper 30 tablet 11 07/01/2023   midodrine (PROAMATINE) 10 MG tablet Take 1 tablet (10 mg total) by mouth 3 (three) times daily with meals. 90 tablet 1 07/03/2023   Nebulizers (COMPRESSOR/NEBULIZER) MISC 1 Units by Does not apply route daily as needed. 1 each 0 07/03/2023   pantoprazole (PROTONIX) 40 MG tablet TAKE 1 TABLET (40 MG TOTAL) BY MOUTH DAILY. TAKE 30-60 MIN BEFORE FIRST MEAL OF THE DAY 90 tablet 0 07/03/2023   tamsulosin (FLOMAX) 0.4 MG CAPS capsule Take 0.8 mg by mouth daily.   07/01/2023   vitamin B-12 (CYANOCOBALAMIN) 1000 MCG tablet Take 1,000 mcg by mouth daily.   07/03/2023   Scheduled:   acetaminophen  500 mg Oral Once   arformoterol  15 mcg Nebulization BID   aspirin EC  325 mg Oral Q breakfast   atorvastatin  10 mg Oral Daily   budesonide  0.5 mg Nebulization BID   Chlorhexidine Gluconate Cloth  6 each Topical Daily   cyanocobalamin  1,000 mcg Oral Daily   dextromethorphan-guaiFENesin  1 tablet  Oral BID   docusate sodium  100 mg Oral BID   donepezil  10 mg Oral q morning   famotidine  20 mg Oral Daily   feeding supplement  237 mL Oral BID BM   ipratropium  0.5 mg Nebulization Q8H   methylPREDNISolone (SOLU-MEDROL) injection  125 mg Intravenous Q12H   midodrine  10 mg Oral TID WC   pantoprazole  40 mg Oral Daily   polyethylene glycol  17 g Oral Daily   tamsulosin  0.8 mg Oral Daily   traMADol  50 mg Oral Q6H   Infusions:   sodium chloride 150 mL/hr at 07/08/23 1524   sodium chloride 10 mL/hr at 07/08/23 0256   amiodarone 30 mg/hr (07/08/23 1343)   heparin 800 Units/hr (07/08/23 0639)   phenylephrine (NEO-SYNEPHRINE) Adult infusion 200 mcg/min (07/08/23 1525)   piperacillin-tazobactam 3.375 g (07/08/23 1327)     Assessment: 88yo male had been set for hospital discharge on 11/11 after stay for a hip fracture and had ortho surgery on 11/8 with post-op DVT Px w/ ASA; respiratory status changed prior to discharge and pt had CXR and CTA which reveals extensive PNA and less-significant acute PE, already started on ABX, now to start heparin.  HL <0.10- sub therapeutic  D-dimer 12.05  Goal of Therapy:  Heparin level 0.3-0.7 units/ml Monitor platelets by anticoagulation protocol: Yes   Plan:  Rebolus Heparin 1500 units IV x 1 Increase heparin infusion to 1000 units/hr. Monitor heparin levels and CBC.  Judeth Cornfield, PharmD Clinical Pharmacist 07/08/2023 3:37 PM

## 2023-07-08 NOTE — Evaluation (Signed)
Clinical/Bedside Swallow Evaluation Patient Details  Name: Travis Murphy MRN: [id number] Date of Birth: [birth year]  Today's Date: 07/08/2023 Time: SLP Start Time (ACUTE ONLY): 1345 SLP Stop Time (ACUTE ONLY): 1410 SLP Time Calculation (min) (ACUTE ONLY): 25 min  Past Medical History:  Past Medical History:  Diagnosis Date   Arthritis    Essential hypertension    Hyperlipemia    Hyperplastic colon polyp 2002   Dr. Lovell Sheehan   Pulmonary nodule    a. right lower lobe. 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 severe AS by echo in 2017 b. 01/2018: repeat echo showing EF of 55-60%, Grade 2 DD, and severe AS with mean gradient of 58 mm Hg   Vertigo    Past Surgical History:  Past Surgical History:  Procedure Laterality Date   CATARACT EXTRACTION Right    COLONOSCOPY  08/17/2001   Small polyp measured 3 mm removed/multiple middle scattered diverticula in the ascending colon-hyperplastic   COLONOSCOPY  08/14/2011   Procedure: COLONOSCOPY;  Surgeon: Corbin Ade, MD;  Location: [facility];  Service: Endoscopy;  Laterality: N/A;  10:00   EXCISIONAL HEMORRHOIDECTOMY     INTRAOPERATIVE TRANSTHORACIC ECHOCARDIOGRAM N/A 04/21/2018   Procedure: INTRAOPERATIVE TRANSTHORACIC ECHOCARDIOGRAM;  Surgeon: Kathleene Hazel, MD;  Location: [facility] OR;  Service: Open Heart Surgery;  Laterality: N/A;   RIGHT/LEFT HEART CATH AND CORONARY ANGIOGRAPHY N/A 03/25/2018   Procedure: RIGHT/LEFT HEART CATH AND CORONARY ANGIOGRAPHY;  Surgeon: Kathleene Hazel, MD;  Location: [facility] INVASIVE CV LAB;  Service: Cardiovascular;  Laterality: N/A;   TRANSCATHETER AORTIC VALVE REPLACEMENT, TRANSFEMORAL  04/21/2018   TRANSCATHETER AORTIC VALVE  REPLACEMENT, TRANSFEMORAL N/A 04/21/2018   Procedure: TRANSCATHETER AORTIC VALVE REPLACEMENT, TRANSFEMORAL;  Surgeon: Kathleene Hazel, MD;  Location: [facility] OR;  Service: Open Heart Surgery;  Laterality: N/A;   HPI:  Travis Murphy  is a 88 y.o. male s/p ORIF Left hip fracture on 07/04/23, past medical history of COPD, on 2 L nasal cannula at home intermittently, severe aortic stenosis status post TAVR, hypertension, dementia. Pt was ready for d/c yesterday when he decompensated from a respiratory standpoint and was transferred to ICU. BSE requested. Chest xray yesterday shows: Patchy increased airspace disease in the right upper lobe mid  perihilar area, and left lower lung field consistent with pneumonia  or aspiration. CT chest: Two small subsegmental pulmonary emboli in the right lower lobe.  Extensive pneumonia at the lung bases, most confluent at the  right lower lobe.    Assessment / Plan / Recommendation  Clinical Impression  Clinical swallow evaluation completed at bedside with family present. Pt denies any previous or current difficulties with swallowing. Oral care completed and U/L dentures placed. Pt with congested cough prior to PO. He consumed ice chips, thin water via cup/straw, puree, and regular textures with an occasional delayed throat clear. Pt was encouraged to cough strongly and expectorate mucous if able (unable to bring anything up). Recommend continue diet as ordered, regular textures and thin liquids with aspiration and reflux precautions. Pt with recent hip fracture repair and has recent rib fracture, h/o COPD. RN reports that Pt has been drinking water and took medication whole with water today without incident. SLP will  follow x1 in acute setting. SLP Visit Diagnosis: Dysphagia, unspecified (R13.10)    Aspiration Risk  Mild aspiration risk    Diet Recommendation Regular;Thin liquid    Liquid Administration via: Cup;Straw Medication Administration: Whole meds with  liquid Supervision: Patient able to self feed;Intermittent supervision to cue for compensatory strategies Compensations: Slow rate;Small sips/bites;Multiple dry swallows after each bite/sip Postural Changes: Seated upright at 90 degrees;Remain upright for at least 30 minutes after po intake    Other  Recommendations Oral Care Recommendations: Oral care BID;Staff/trained caregiver to provide oral care    Recommendations for follow up therapy are one component of a multi-disciplinary discharge planning process, led by the attending physician.  Recommendations may be updated based on patient status, additional functional criteria and insurance authorization.  Follow up Recommendations Follow physician's recommendations for discharge plan and follow up therapies      Assistance Recommended at Discharge    Functional Status Assessment Patient has had a recent decline in their functional status and demonstrates the ability to make significant improvements in function in a reasonable and predictable amount of time.  Frequency and Duration min 2x/week  1 week       Prognosis Prognosis for improved oropharyngeal function: Good      Swallow Study   General Date of Onset: 07/07/23 HPI: Travis Murphy  is a 88 y.o. male s/p ORIF Left hip fracture on 07/04/23, past medical history of COPD, on 2 L nasal cannula at home intermittently, severe aortic stenosis status post TAVR, hypertension, dementia. Pt was ready for d/c yesterday when he decompensated from a respiratory standpoint and was transferred to ICU. BSE requested. Chest xray yesterday shows: Patchy increased airspace disease in the right upper lobe mid  perihilar area, and left lower lung field consistent with pneumonia  or aspiration. CT chest: Two small subsegmental pulmonary emboli in the right lower lobe.  Extensive pneumonia at the lung bases, most confluent at the  right lower lobe. Type of Study: Bedside Swallow Evaluation Previous  Swallow Assessment: N/A Diet Prior to this Study: Regular;Thin liquids (Level 0) Temperature Spikes Noted: No Respiratory Status: Nasal cannula History of Recent Intubation: No Behavior/Cognition: Alert;Cooperative;Pleasant mood Oral Cavity Assessment: Dry Oral Care Completed by SLP: Yes Oral Cavity - Dentition: Dentures, top;Dentures, bottom Vision: Functional for self-feeding Self-Feeding Abilities: Able to feed self Patient Positioning: Upright in bed Baseline Vocal Quality: Normal Volitional Cough: Congested;Strong Volitional Swallow: Able to elicit    Oral/Motor/Sensory Function Overall Oral Motor/Sensory Function: Within functional limits   Ice Chips Ice chips: Within functional limits Presentation: Spoon   Thin Liquid Thin Liquid: Within functional limits Presentation: Cup;Self Fed;Straw    Nectar Thick Nectar Thick Liquid: Not tested   Honey Thick Honey Thick Liquid: Not tested   Puree Puree: Within functional limits Presentation: Spoon   Solid     Solid: Within functional limits Presentation: Spoon     Thank you,  Havery Moros, CCC-SLP [phone]  Selah Klang 07/08/2023,2:22 PM

## 2023-07-08 NOTE — Progress Notes (Signed)
Radiology has now read the CTA, but the reading room answering service reports that their system is down so they can't get me through to the doctor to get the report.

## 2023-07-08 NOTE — Progress Notes (Signed)
At bedside to place PICC. Spoke with family member to talk to the patient's daughter Rosey Bath [facility]) over the phone and stated that she wants to talk to MD first before placing central line/PICC. RN at bedside and aware. Will follow up.

## 2023-07-08 NOTE — Plan of Care (Signed)
  Problem: Education: Goal: Knowledge of General Education information will improve Description: Including pain rating scale, medication(s)/side effects and non-pharmacologic comfort measures Outcome: Progressing   Problem: Health Behavior/Discharge Planning: Goal: Ability to manage health-related needs will improve Outcome: Progressing   Problem: Clinical Measurements: Goal: Ability to maintain clinical measurements within normal limits will improve Outcome: Progressing Goal: Will remain free from infection Outcome: Progressing Goal: Diagnostic test results will improve Outcome: Progressing   Problem: Nutrition: Goal: Adequate nutrition will be maintained Outcome: Progressing   Problem: Pain Management: Goal: General experience of comfort will improve Outcome: Progressing   Problem: Safety: Goal: Ability to remain free from injury will improve Outcome: Progressing   Problem: Skin Integrity: Goal: Risk for impaired skin integrity will decrease Outcome: Progressing   Problem: Education: Goal: Verbalization of understanding the information provided (i.e., activity precautions, restrictions, etc) will improve Outcome: Progressing Goal: Individualized Educational Video(s) Outcome: Progressing   Problem: Activity: Goal: Ability to ambulate and perform ADLs will improve Outcome: Progressing   Problem: Clinical Measurements: Goal: Postoperative complications will be avoided or minimized Outcome: Progressing   Problem: Self-Concept: Goal: Ability to maintain and perform role responsibilities to the fullest extent possible will improve Outcome: Progressing   Problem: Pain Management: Goal: Pain level will decrease Outcome: Progressing

## 2023-07-08 NOTE — Progress Notes (Signed)
Subjective: 4 Days Post-Op Procedure(s) (LRB): INTRAMEDULLARY (IM) NAIL INTERTROCHANTERIC (Left) PE PNA   Objective: Vital signs in last 24 hours:BP (!) 86/55   Pulse 88   Temp 97.7 F (36.5 C) (Axillary)   Resp (!) 23   Ht 5\' 6"  (1.676 m)   Wt 54.4 kg   SpO2 99%   BMI 19.37 kg/m   Intake/Output from previous day: 11/11 0701 - 11/12 0700 In: 3916.9 [P.O.:120; I.V.:2472.6; IV Piggyback:1324.3] Out: 70 [Urine:70] Intake/Output this shift: Total I/O In: 40.8 [I.V.:40.8] Out: -   Recent Labs    07/06/23 0318  HGB 11.6*   Recent Labs    07/06/23 0318  WBC 11.8*  RBC 3.70*  HCT 36.4*  PLT 157       Latest Ref Rng & Units 07/06/2023    3:18 AM 07/05/2023    3:40 AM 07/04/2023    6:20 AM  CBC  WBC 4.0 - 10.5 K/uL 11.8  12.8  7.8   Hemoglobin 13.0 - 17.0 g/dL 16.1  09.6  04.5   Hematocrit 39.0 - 52.0 % 36.4  34.5  39.4   Platelets 150 - 400 K/uL 157  162  172     CXR IMPRESSION: 1. Patchy increased airspace disease in the right upper lobe mid perihilar area, and left lower lung field consistent with pneumonia or aspiration. 2. Small pleural effusions have also formed. 3. Stable cardiomegaly with TAVR. 4. Aortic atherosclerosis.     Electronically Signed   By: Almira Bar M.D.   On: 07/07/2023 21:42   CTA IMPRESSION: 1. Two small subsegmental pulmonary emboli in the right lower lobe. 2. Extensive pneumonia at the lung bases, most confluent at the right lower lobe. 3. Possible cirrhosis, correlate with risk factors. 4. Emphysema and extensive atherosclerosis.     Electronically Signed   By: Tiburcio Pea M.D.   On: 07/08/2023 05:34  No results for input(s): "NA", "K", "CL", "CO2", "BUN", "CREATININE", "GLUCOSE", "CALCIUM" in the last 72 hours. No results for input(s): "LABPT", "INR" in the last 72 hours.  {physical exam:3041401}   Assessment/Plan: 4 Days Post-Op Procedure(s) (LRB): INTRAMEDULLARY (IM) NAIL INTERTROCHANTERIC  (Left) {WUJW:1191478}   {ORTHOADMISSIONSTATUS:21269}   Fuller Canada 07/08/2023, 11:26 AM

## 2023-07-09 DIAGNOSIS — I959 Hypotension, unspecified: Secondary | ICD-10-CM | POA: Diagnosis not present

## 2023-07-09 DIAGNOSIS — S72002D Fracture of unspecified part of neck of left femur, subsequent encounter for closed fracture with routine healing: Secondary | ICD-10-CM | POA: Diagnosis not present

## 2023-07-09 DIAGNOSIS — Z515 Encounter for palliative care: Secondary | ICD-10-CM | POA: Diagnosis not present

## 2023-07-09 DIAGNOSIS — I10 Essential (primary) hypertension: Secondary | ICD-10-CM

## 2023-07-09 DIAGNOSIS — S72142A Displaced intertrochanteric fracture of left femur, initial encounter for closed fracture: Secondary | ICD-10-CM | POA: Diagnosis not present

## 2023-07-09 DIAGNOSIS — I2699 Other pulmonary embolism without acute cor pulmonale: Secondary | ICD-10-CM | POA: Clinically undetermined

## 2023-07-09 DIAGNOSIS — I4891 Unspecified atrial fibrillation: Secondary | ICD-10-CM

## 2023-07-09 DIAGNOSIS — Z7189 Other specified counseling: Secondary | ICD-10-CM | POA: Diagnosis not present

## 2023-07-09 DIAGNOSIS — J9611 Chronic respiratory failure with hypoxia: Secondary | ICD-10-CM | POA: Diagnosis not present

## 2023-07-09 DIAGNOSIS — A419 Sepsis, unspecified organism: Secondary | ICD-10-CM | POA: Clinically undetermined

## 2023-07-09 LAB — CBC
HCT: 32.2 % — ABNORMAL LOW (ref 39.0–52.0)
Hemoglobin: 10.2 g/dL — ABNORMAL LOW (ref 13.0–17.0)
MCH: 31.2 pg (ref 26.0–34.0)
MCHC: 31.7 g/dL (ref 30.0–36.0)
MCV: 98.5 fL (ref 80.0–100.0)
Platelets: 199 10*3/uL (ref 150–400)
RBC: 3.27 MIL/uL — ABNORMAL LOW (ref 4.22–5.81)
RDW: 14.1 % (ref 11.5–15.5)
WBC: 12.1 10*3/uL — ABNORMAL HIGH (ref 4.0–10.5)
nRBC: 0.2 % (ref 0.0–0.2)

## 2023-07-09 LAB — BASIC METABOLIC PANEL
Anion gap: 5 (ref 5–15)
BUN: 27 mg/dL — ABNORMAL HIGH (ref 8–23)
CO2: 21 mmol/L — ABNORMAL LOW (ref 22–32)
Calcium: 7 mg/dL — ABNORMAL LOW (ref 8.9–10.3)
Chloride: 108 mmol/L (ref 98–111)
Creatinine, Ser: 0.9 mg/dL (ref 0.61–1.24)
GFR, Estimated: >60 mL/min (ref >60)
Glucose, Bld: 145 mg/dL — ABNORMAL HIGH (ref 70–99)
Potassium: 3.9 mmol/L (ref 3.5–5.1)
Sodium: 134 mmol/L — ABNORMAL LOW (ref 135–145)

## 2023-07-09 LAB — HEPARIN LEVEL (UNFRACTIONATED)
Heparin Unfractionated: 0.12 [IU]/mL — ABNORMAL LOW (ref 0.30–0.70)
Heparin Unfractionated: 0.16 [IU]/mL — ABNORMAL LOW (ref 0.30–0.70)
Heparin Unfractionated: <0.1 [IU]/mL — ABNORMAL LOW (ref 0.30–0.70)

## 2023-07-09 MED ORDER — METHYLPREDNISOLONE SODIUM SUCC 40 MG IJ SOLR
40.0000 mg | Freq: Two times a day (BID) | INTRAMUSCULAR | Status: DC
  Administered 2023-07-09 – 2023-07-11 (×4): 40 mg via INTRAVENOUS
  Filled 2023-07-09 (×4): qty 1

## 2023-07-09 MED ORDER — HALOPERIDOL LACTATE 5 MG/ML IJ SOLN
2.0000 mg | Freq: Once | INTRAMUSCULAR | Status: AC
  Administered 2023-07-09: 2 mg via INTRAVENOUS
  Filled 2023-07-09: qty 1

## 2023-07-09 MED ORDER — HEPARIN BOLUS VIA INFUSION
1500.0000 [IU] | Freq: Once | INTRAVENOUS | Status: AC
  Administered 2023-07-09: 1500 [IU] via INTRAVENOUS
  Filled 2023-07-09: qty 1500

## 2023-07-09 MED ORDER — HYDROXYZINE HCL 25 MG PO TABS
25.0000 mg | ORAL_TABLET | Freq: Once | ORAL | Status: AC | PRN
  Administered 2023-07-09: 25 mg via ORAL
  Filled 2023-07-09: qty 1

## 2023-07-09 MED ORDER — VANCOMYCIN VARIABLE DOSE PER UNSTABLE RENAL FUNCTION (PHARMACIST DOSING): Status: DC

## 2023-07-09 MED ORDER — HYDROXYZINE HCL 10 MG PO TABS: 10.0000 mg | ORAL_TABLET | Freq: Once | ORAL | Status: DC | PRN

## 2023-07-09 MED ORDER — IPRATROPIUM BROMIDE 0.02 % IN SOLN
0.5000 mg | Freq: Three times a day (TID) | RESPIRATORY_TRACT | Status: DC
  Administered 2023-07-09 – 2023-07-11 (×6): 0.5 mg via RESPIRATORY_TRACT
  Filled 2023-07-09 (×6): qty 2.5

## 2023-07-09 MED ORDER — HEPARIN BOLUS VIA INFUSION
1600.0000 [IU] | Freq: Once | INTRAVENOUS | Status: AC
  Administered 2023-07-09: 1600 [IU] via INTRAVENOUS
  Filled 2023-07-09: qty 1600

## 2023-07-09 MED ORDER — MELATONIN 3 MG PO TABS: 6.0000 mg | ORAL_TABLET | Freq: Once | ORAL | Status: DC

## 2023-07-09 MED ORDER — ASPIRIN 81 MG PO TBEC
81.0000 mg | DELAYED_RELEASE_TABLET | Freq: Every day | ORAL | Status: DC
Start: 2023-07-10 — End: 2023-07-11
  Administered 2023-07-10 – 2023-07-11 (×2): 81 mg via ORAL
  Filled 2023-07-09 (×2): qty 1

## 2023-07-09 MED ORDER — VANCOMYCIN HCL IN DEXTROSE 1-5 GM/200ML-% IV SOLN
1000.0000 mg | INTRAVENOUS | Status: DC
  Administered 2023-07-09 – 2023-07-10 (×2): 1000 mg via INTRAVENOUS
  Filled 2023-07-09 (×3): qty 200

## 2023-07-09 NOTE — Progress Notes (Addendum)
Progress Note  Patient: Travis Murphy ZOX:[id number] DOB: Jan 24, 1935 DOA: 07/03/2023     6 DOS: the patient was seen and examined on 07/09/2023   Brief summary  88 y.o. male, past medical history of COPD, on 2 L nasal cannula at home intermittently, severe aortic stenosis status post TAVR, hypertension, dementia admitted on 07/03/2023 with left hip fracture after mechanical fall at a convenience store, underwent left hip ORIF on 07/04/2023, was on aspirin for DVT prophylaxis. -Developed acute on chronic hypoxic respiratory failure on 07/07/2023 was found to have extensive pneumonia especially on the right along with 2 small subsegmental PE in the right lower lobe -Severe hypoxia persist requiring BiPAP alternating with high flow nasal cannula -Patient and family request DNR/DNI, they do want to treat the treatable including pneumonia and PE  Problem  Severe sepsis with septic shock (HCC)  Acute Pulmonary Embolism (Hcc)  Severe Sepsis with Septic Shock due to pneumonia (HCC)  Protein-calorie malnutrition, severe  Intertrochanteric Fracture of Left Hip, Closed, Initial Encounter (Hcc)  Acute on chronic respiratory failure with hypoxia (HCC)      Assessment and Plan:  1)Bil PNA --Rt > Lt --- ??  Hospital-acquired  -CTA chest with extensive bilateral pneumonia right more than left continue IV Zosyn and Zosyn started on 07/08/2023 -Consider stopping IV vancomycin after 07/09/2023 dose (MRSA negative) -Continue bronchodilators and mucolytic's  -Blood cultures from 07/08/2023 NGTD  2)Acute Pulmonary Embolism-- --CTA chest shows  2 small subsegmental PE in the right lower lobe -Echo with EF of 60 to 65%, -No right heart strain -Continue IV heparin  3) acute COPD exacerbation--- continue IV Solu-Medrol, bronchodilators, mucolytics and antibiotics  4)Acute on chronic hypoxic respiratory failure--prior to admission patient was on 2 L of oxygen via nasal cannula -Severe hypoxia  persist requiring BiPAP alternating with heated high flow nasal cannula (70 % Fi02 at 25 L/min)  5)Lt Hip Fx---admitted on 07/03/2023 with left hip fracture after mechanical fall at a convenience store, underwent left hip ORIF on 07/04/2023,  -Patient will need transfer to SNF facility once medically stable  6)-social/ethics--- plan of care discussed with patient and 2 daughters at bedside --Patient and family request DNR/DNI, they do want to treat the treatable including pneumonia and PE  7)Dementia -Continue Aricept  -No behavioral disturbances currently appreciated. -Continue constant reorientation and supportive care.Marland Kitchen    8)PAFiB--- now with A-fib with RVR -Patient required initiation of amiodarone drip for rate control -Soft blood pressure limiting the use of other rate controlling agents. -Continue to follow heart rate -Stabilize electrolytes as needed with a goal potassium above 4 and magnesium above 2.  9)GERD-- -Continue PPI.  10)chronic diastolic heart failure/status post TAVR--echo from 07/16/2023 with EF of 60 to 65% -Evidence of prior aortic valve repair/TAVR,  no aortic stenosis Euvolemic, stable and compensated -Continue to follow daily weights/I's and O's. -Low-sodium diet discussed with patient.  11)-constipation -Will continue the use of MiraLAX and Colace -PRN dulcolax suppository for severe constipation. -Lack of movement and as needed analgesics most likely playing a role.  12)Persistent hypotension/hemodynamic instability--due to #1 and #2 above -Continue IV Neo-Synephrine for pressure support   CRITICAL CARE Performed by: Shon Hale   Total critical care time: 54 minutes  Critical care time was exclusive of separately billable procedures and treating other patients.  Persistent hypotension/hemodynamic instability--due to #1 and #2 above -Continue IV Neo-Synephrine for pressure support  Critical care was necessary to treat or prevent imminent  or life-threatening deterioration.  Critical care was  time spent personally by me on the following activities: development of treatment plan with patient and/or surrogate as well as nursing, discussions with consultants, evaluation of patient's response to treatment, examination of patient, obtaining history from patient or surrogate, ordering and performing treatments and interventions, ordering and review of laboratory studies, ordering and review of radiographic studies, pulse oximetry and re-evaluation of patient's condition.    Subjective:  -Daughters x 2 at bedside -No fever  Or chills  -Dyspnea persist -Severe hypoxia persist  Physical Exam: Vitals:   07/09/23 0925 07/09/23 1113 07/09/23 1126 07/09/23 1401  BP:      Pulse:  91    Resp:  20    Temp:   (!) 97.5 F (36.4 C)   TempSrc:   Axillary   SpO2: 100% 91%  100%  Weight:      Height:        Physical Exam  Gen:- Awake Alert, conversational dyspnea, HEENT:- [place].AT, No sclera icterus Nose--BiPAP alternating with high flow nasal cannula Neck-Supple Neck,No JVD,.  Lungs-diminished breath sounds with scattered wheezes and rhonchi CV- S1, S2 normal, irregular  abd-  +ve B.Sounds, Abd Soft, No tenderness,    Extremity/Skin:-  good pedal pulses , left hip postop wound is hemostatic Psych-affect is appropriate, oriented x3 Neuro-generalized weakness, no new focal deficits, no tremors   Family Communication: Daughters Dois Davenport and Forgan at bedside.  Disposition:--- SNF when medically stable Status is: Inpatient  Author: Shon Hale, MD 07/09/2023 2:33 PM  For on call review [email or web address].

## 2023-07-09 NOTE — Progress Notes (Signed)
PHARMACY - ANTICOAGULATION CONSULT NOTE  Pharmacy Consult for heparin Indication: pulmonary embolus  No Known Allergies  Patient Measurements: Height: 5\' 6"  (167.6 cm) Weight: 54.4 kg (120 lb) IBW/kg (Calculated) : 63.8  Vital Signs: Temp: 97.3 F (36.3 C) (11/13 1954) Temp Source: Oral (11/13 1954) BP: 100/46 (11/13 2130) Pulse Rate: 66 (11/13 2130)  Labs: Recent Labs    07/07/23 2002 07/08/23 1431 07/08/23 2352 07/09/23 0219 07/09/23 1002 07/09/23 2053  HGB  --   --   --  10.2*  --   --   HCT  --   --   --  32.2*  --   --   PLT  --   --   --  199  --   --   HEPARINUNFRC  --    < > <0.10*  --  0.12* 0.16*  CREATININE  --   --   --  0.90  --   --   TROPONINIHS 34*  --   --   --   --   --    < > = values in this interval not displayed.    Estimated Creatinine Clearance: 45.3 mL/min (by C-G formula based on SCr of 0.9 mg/dL).  Assessment: 88yo male had been set for hospital discharge on 11/11 after stay for a hip fracture and had ortho surgery on 11/8 with post-op DVT Px w/ ASA; respiratory status changed prior to discharge and pt had CXR and CTA which reveals extensive PNA and less-significant acute PE, already started on ABX, now to start heparin. D-dimer 12.05  HL 0.16- sub therapeutic on 1400 units/hr.   Goal of Therapy:  Heparin level 0.3-0.7 units/ml Monitor platelets by anticoagulation protocol: Yes   Plan:  Rebolus Heparin 1600 units IV x 1 Increase heparin infusion to 1550 units/hr. Check Anti-Xa level in 8 hours Monitor heparin levels and CBC.  Thank you for involving pharmacy in this patient's care.   Rockwell Alexandria, PharmD Clinical Pharmacist 07/09/2023 9:52 PM

## 2023-07-09 NOTE — Progress Notes (Signed)
PHARMACY - ANTICOAGULATION CONSULT NOTE  Pharmacy Consult for heparin Indication: pulmonary embolus  No Known Allergies  Patient Measurements: Height: 5\' 6"  (167.6 cm) Weight: 54.4 kg (120 lb) IBW/kg (Calculated) : 63.8  Vital Signs: Temp: 97.5 F (36.4 C) (11/13 1126) Temp Source: Axillary (11/13 1126) BP: 130/53 (11/13 0900) Pulse Rate: 91 (11/13 1113)  Labs: Recent Labs    07/07/23 2002 07/08/23 1431 07/08/23 2352 07/09/23 0219 07/09/23 1002  HGB  --   --   --  10.2*  --   HCT  --   --   --  32.2*  --   PLT  --   --   --  199  --   HEPARINUNFRC  --  <0.10* <0.10*  --  0.12*  CREATININE  --   --   --  0.90  --   TROPONINIHS 34*  --   --   --   --     Estimated Creatinine Clearance: 45.3 mL/min (by C-G formula based on SCr of 0.9 mg/dL).  Assessment: 88yo male had been set for hospital discharge on 11/11 after stay for a hip fracture and had ortho surgery on 11/8 with post-op DVT Px w/ ASA; respiratory status changed prior to discharge and pt had CXR and CTA which reveals extensive PNA and less-significant acute PE, already started on ABX, now to start heparin. D-dimer 12.05  HL 0.12- sub therapeutic on 1200 units/hr.   Goal of Therapy:  Heparin level 0.3-0.7 units/ml Monitor platelets by anticoagulation protocol: Yes   Plan:  Rebolus Heparin 1500 units IV x 1 Increase heparin infusion to 1400 units/hr. Monitor heparin levels and CBC.  Judeth Cornfield, PharmD Clinical Pharmacist 07/09/2023 11:35 AM

## 2023-07-09 NOTE — Progress Notes (Signed)
Patient placed back on Bipap due to increased WOB and sats dropping despite being on 15L [place].

## 2023-07-09 NOTE — TOC Progression Note (Signed)
Transition of Care [facility]) - Progression Note    Patient Details  Name: Claudy Soto MRN: [id number] Date of Birth: Feb 06, 1935  Transition of Care [facility]) CM/SW Contact  Leitha Bleak, RN Phone Number: 07/09/2023, 12:55 PM  Clinical Narrative:   INS Auth expires today. Patient is not medically ready. On Bipap, increased oxygen. Auth Cancelled. Palliative consulted. [facility] updated. TOC following.     Expected Discharge Plan: Skilled Nursing Facility Barriers to Discharge: Barriers Resolved  Expected Discharge Plan and Services       Living arrangements for the past 2 months: Single Family Home Expected Discharge Date: 07/07/23                  Social Determinants of Health (SDOH) Interventions SDOH Screenings   Food Insecurity: No Food Insecurity (07/04/2023)  Housing: Low Risk  (07/04/2023)  Transportation Needs: No Transportation Needs (07/04/2023)  Utilities: Not At Risk (07/04/2023)  Alcohol Screen: Low Risk  (07/27/2022)  Depression (PHQ2-9): Low Risk  (07/27/2022)  Financial Resource Strain: Low Risk  (07/27/2022)  Physical Activity: Inactive (07/27/2022)  Social Connections: Moderately Isolated (07/27/2022)  Stress: No Stress Concern Present (07/27/2022)  Tobacco Use: High Risk (07/08/2023)

## 2023-07-09 NOTE — Plan of Care (Signed)
  Problem: Education: Goal: Knowledge of General Education information will improve Description: Including pain rating scale, medication(s)/side effects and non-pharmacologic comfort measures Outcome: Progressing   Problem: Health Behavior/Discharge Planning: Goal: Ability to manage health-related needs will improve Outcome: Progressing   Problem: Clinical Measurements: Goal: Ability to maintain clinical measurements within normal limits will improve Outcome: Progressing Goal: Will remain free from infection Outcome: Progressing Goal: Diagnostic test results will improve Outcome: Progressing   Problem: Pain Management: Goal: General experience of comfort will improve Outcome: Progressing   Problem: Safety: Goal: Ability to remain free from injury will improve Outcome: Progressing   Problem: Skin Integrity: Goal: Risk for impaired skin integrity will decrease Outcome: Progressing   Problem: Education: Goal: Verbalization of understanding the information provided (i.e., activity precautions, restrictions, etc) will improve Outcome: Progressing   Problem: Clinical Measurements: Goal: Postoperative complications will be avoided or minimized Outcome: Progressing   Problem: Self-Concept: Goal: Ability to maintain and perform role responsibilities to the fullest extent possible will improve Outcome: Progressing   Problem: Pain Management: Goal: Pain level will decrease Outcome: Progressing

## 2023-07-09 NOTE — Progress Notes (Signed)
Patient had been on 45% FIO2 on Bipap; patient started to desat, had to be taken up to 65% FIO2 on Bipap to maintain sat; current sat at 93%.

## 2023-07-09 NOTE — Progress Notes (Signed)
Concerns for dysphagia and possible aspiration risk-- Discussed with Speech Pathologist okay to give medications with applesauce - plans for possible modified barium swallow on 07/10/2023  Shon Hale, MD

## 2023-07-09 NOTE — Progress Notes (Signed)
Heard alarms going off, went to assess patient. Pt. Pulling off BIPAP, pt. Is very agiated and restless/anxious. Pt. And increased work of breathing and breathing is very labored. Pt. States he has to get up and go while attempting to get out of bed. Pt. States he doesn't want the mask on anymore. Nurse applied HFNC at 10 L. PRN medication given. RT notified. All safety measures in place for pt. Safety.    Around 0336am: pt. Lying comfortably in semi-fowlers position in bed with eyes closed. All vital signs stable/within normal limits.

## 2023-07-09 NOTE — Progress Notes (Signed)
Covid/flu test performed to evaluate for URI as a cause of his generalized weakness

## 2023-07-09 NOTE — Progress Notes (Signed)
Speech Language Pathology Treatment: Dysphagia  Patient Details Name: Travis Murphy MRN: [id number] DOB: July 05, 1935 Today's Date: 07/09/2023 Time: 2130-8657 SLP Time Calculation (min) (ACUTE ONLY): 32 min  Assessment / Plan / Recommendation Clinical Impression  Ongoing diagnostic dysphagia provided; Pt was placed on BiPAP in the night and have been alternating high flow nasal cannula with BiPAP throughout the day. Pt was easily roused for PO trials; note wet congested cough at baseline. Pt consumed thin trials via cup and straw with consistent delayed wet cough after each sip. SLP provided NTL trials with no appreciable difference. Today's presentation is altered from BSE yesterday. Recommend consider MBSS to objectively assess the swallowing function. Recommend Pt be NPO pending MBSS. Above to family, RN and MD   HPI HPI: 88 y.o. male, past medical history of COPD, on 2 L nasal cannula at home intermittently, severe aortic stenosis status post TAVR, hypertension, dementia admitted on 07/03/2023 with left hip fracture after mechanical fall at a convenience store, underwent left hip ORIF on 07/04/2023, was on aspirin for DVT prophylaxis.  -Developed acute on chronic hypoxic respiratory failure on 07/07/2023 was found to have extensive pneumonia especially on the right along with 2 small subsegmental PE in the right lower lobe  -Severe hypoxia persist requiring BiPAP alternating with high flow nasal cannula      SLP Plan  MBS;Continue with current plan of care      Recommendations for follow up therapy are one component of a multi-disciplinary discharge planning process, led by the attending physician.  Recommendations may be updated based on patient status, additional functional criteria and insurance authorization.    Recommendations  Diet recommendations: NPO Liquids provided via: Cup;Straw Medication Administration: Whole meds with puree Supervision: Staff to assist with self  feeding Compensations: Slow rate;Small sips/bites;Multiple dry swallows after each bite/sip Postural Changes and/or Swallow Maneuvers: Seated upright 90 degrees;Upright 30-60 min after meal                  Oral care BID;Staff/trained caregiver to provide oral care     Dysphagia, unspecified (R13.10)     MBS;Continue with current plan of care     Georgetta Haber  07/09/2023, 4:32 PM

## 2023-07-09 NOTE — Progress Notes (Signed)
PHARMACY - ANTICOAGULATION CONSULT NOTE  Pharmacy Consult for heparin Indication: pulmonary embolus  No Known Allergies  Patient Measurements: Height: 5\' 6"  (167.6 cm) Weight: 54.4 kg (120 lb) IBW/kg (Calculated) : 63.8  Vital Signs: Temp: 97.8 F (36.6 C) (11/12 2230) Temp Source: Oral (11/12 2230) BP: 112/50 (11/12 2230) Pulse Rate: 88 (11/12 2240)  Labs: Recent Labs    07/06/23 0318 07/07/23 2002 07/08/23 1431 07/08/23 2352  HGB 11.6*  --   --   --   HCT 36.4*  --   --   --   PLT 157  --   --   --   HEPARINUNFRC  --   --  <0.10* <0.10*  TROPONINIHS  --  34*  --   --     Estimated Creatinine Clearance: 39.6 mL/min (by C-G formula based on SCr of 1.03 mg/dL).  Assessment: 88yo male had been set for hospital discharge on 11/11 after stay for a hip fracture and had ortho surgery on 11/8 with post-op DVT Px w/ ASA; respiratory status changed prior to discharge and pt had CXR and CTA which reveals extensive PNA and less-significant acute PE, already started on ABX, now to start heparin. D-dimer 12.05  HL <0.10- sub therapeutic on 1000 units/hr. Per RN, no issues with the heparin running continuously or sign/symptoms of bleeding. Heparin level drawn from opposite side of infusion  Goal of Therapy:  Heparin level 0.3-0.7 units/ml Monitor platelets by anticoagulation protocol: Yes   Plan:  Rebolus Heparin 1500 units IV x 1 Increase heparin infusion to 1200 units/hr. Monitor heparin levels and CBC.  Arabella Merles, PharmD. Clinical Pharmacist 07/09/2023 12:12 AM

## 2023-07-09 NOTE — Plan of Care (Signed)

## 2023-07-09 NOTE — Progress Notes (Signed)
Palliative:   Mr. Travis Murphy is lying quietly in bed.  He appears acutely ill and frail.  He greets me, making and mostly keeping eye contact.  He is alert and oriented, able to make his needs known.  His daughter is present at bedside.  We talk about his respiratory status and the treatment plan.  Overall, patient and family are very knowledgeable about his acute illness and the treatment plan.  They are very engaged with staff to ensure quality of care.  He denies pain or discomfort at this time.  Daughter states that they feel that they have a good plan for Mr. Travis Murphy recovery.  No needs identified at this time.  PMT to follow.  Conference with attending, bedside nursing staff, transition of care team related to patient condition, needs, goals of care.  Plan: At this point continue to treat the treatable but no CPR or intubation.  Time for outcomes.  PMT to follow.  25 minutes  Lillia Carmel, NP Palliative Medicine Team  Team Phone [phone]

## 2023-07-10 ENCOUNTER — Other Ambulatory Visit (HOSPITAL_COMMUNITY): Payer: Self-pay

## 2023-07-10 ENCOUNTER — Encounter (HOSPITAL_COMMUNITY): Payer: Self-pay | Admitting: Orthopedic Surgery

## 2023-07-10 ENCOUNTER — Inpatient Hospital Stay (HOSPITAL_COMMUNITY)

## 2023-07-10 DIAGNOSIS — J189 Pneumonia, unspecified organism: Secondary | ICD-10-CM

## 2023-07-10 DIAGNOSIS — N35014 Post-traumatic urethral stricture, male, unspecified: Secondary | ICD-10-CM

## 2023-07-10 DIAGNOSIS — J9611 Chronic respiratory failure with hypoxia: Secondary | ICD-10-CM | POA: Diagnosis not present

## 2023-07-10 DIAGNOSIS — Z7189 Other specified counseling: Secondary | ICD-10-CM | POA: Diagnosis not present

## 2023-07-10 DIAGNOSIS — I2699 Other pulmonary embolism without acute cor pulmonale: Secondary | ICD-10-CM | POA: Diagnosis not present

## 2023-07-10 DIAGNOSIS — I350 Nonrheumatic aortic (valve) stenosis: Secondary | ICD-10-CM

## 2023-07-10 DIAGNOSIS — S72002S Fracture of unspecified part of neck of left femur, sequela: Secondary | ICD-10-CM | POA: Diagnosis not present

## 2023-07-10 DIAGNOSIS — R339 Retention of urine, unspecified: Secondary | ICD-10-CM

## 2023-07-10 DIAGNOSIS — Z515 Encounter for palliative care: Secondary | ICD-10-CM | POA: Diagnosis not present

## 2023-07-10 DIAGNOSIS — S72002D Fracture of unspecified part of neck of left femur, subsequent encounter for closed fracture with routine healing: Secondary | ICD-10-CM | POA: Diagnosis not present

## 2023-07-10 DIAGNOSIS — A419 Sepsis, unspecified organism: Secondary | ICD-10-CM

## 2023-07-10 LAB — CBC
HCT: 30.6 % — ABNORMAL LOW (ref 39.0–52.0)
Hemoglobin: 9.7 g/dL — ABNORMAL LOW (ref 13.0–17.0)
MCH: 30.9 pg (ref 26.0–34.0)
MCHC: 31.7 g/dL (ref 30.0–36.0)
MCV: 97.5 fL (ref 80.0–100.0)
Platelets: 178 10*3/uL (ref 150–400)
RBC: 3.14 MIL/uL — ABNORMAL LOW (ref 4.22–5.81)
RDW: 14.2 % (ref 11.5–15.5)
WBC: 11.5 10*3/uL — ABNORMAL HIGH (ref 4.0–10.5)
nRBC: 0.2 % (ref 0.0–0.2)

## 2023-07-10 LAB — HEPARIN LEVEL (UNFRACTIONATED)
Heparin Unfractionated: 0.46 [IU]/mL (ref 0.30–0.70)
Heparin Unfractionated: 0.41 [IU]/mL (ref 0.30–0.70)

## 2023-07-10 MED ORDER — ORAL CARE MOUTH RINSE
15.0000 mL | OROMUCOSAL | Status: DC
  Administered 2023-07-10 – 2023-07-11 (×8): 15 mL via OROMUCOSAL

## 2023-07-10 MED ORDER — GUAIFENESIN 100 MG/5ML PO LIQD
15.0000 mL | Freq: Four times a day (QID) | ORAL | Status: DC | PRN
  Administered 2023-07-10: 15 mL via ORAL
  Filled 2023-07-10: qty 15

## 2023-07-10 MED ORDER — ORAL CARE MOUTH RINSE: 15.0000 mL | OROMUCOSAL | Status: DC | PRN

## 2023-07-10 MED ORDER — BUDESONIDE 0.5 MG/2ML IN SUSP
0.5000 mg | Freq: Two times a day (BID) | RESPIRATORY_TRACT | Status: DC
  Administered 2023-07-11: 0.5 mg via RESPIRATORY_TRACT
  Filled 2023-07-10: qty 2

## 2023-07-10 NOTE — Progress Notes (Signed)
Palliative: Travis Murphy is sitting up quietly in bed in the intensive care.  He appears acutely/chronically ill and somewhat frail.  He greets me, making and mostly keeping eye contact.  He is alert and oriented, able to make his needs known.  Family is present at bedside.  We talked about his acute health concerns.  Overall he remains stable and optimistic about improvement.  He states that his goal is to improve enough to go to rehab and then return home.  Face-to-face conference with bedside nursing staff related to patient condition, needs, goals of care.  Plan: Continue to treat the treatable but no CPR or intubation.  Time for outcomes.  Would like short-term rehab with ultimate goal of returning home.  25 minutes Lillia Carmel, NP Palliative medicine team Team phone [phone]

## 2023-07-10 NOTE — Consult Note (Signed)
Urology Consult  Referring physician: Dr. Romeo Apple Reason for referral: urinary retention, difficult foley  Chief Complaint: difficulty urinating   History of Present Illness: Mr Travis Murphy is a 88yo with a history of BPH who was admitted with a left hip fracture. At the time of surgery foley catheter placement was attempted which was unsuccessful. The patient was then transferred to the ICU and while in the ICU he had a purewick placed. Today he was noted to have elevated PVRS and multiple attempts by nursing were made to place a foley which were unsuccessful. He patient states he has a longstanding weak urinary stream and straining to urinate. He has nocturia 3-5x. He denies dysuria, gross hematuria or hx of UTIs.   Past Medical History:  Diagnosis Date   Arthritis    Essential hypertension    Hyperlipemia    Hyperplastic colon polyp 2002   Dr. Lovell Sheehan   Pulmonary nodule    a. right lower lobe. 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 severe AS by echo in 2017 b. 01/2018: repeat echo showing EF of 55-60%, Grade 2 DD, and severe AS with mean gradient of 58 mm Hg   Vertigo    Past Surgical History:  Procedure Laterality Date   CATARACT EXTRACTION Right    COLONOSCOPY  08/17/2001   Small polyp measured 3 mm removed/multiple middle scattered diverticula in the ascending colon-hyperplastic   COLONOSCOPY  08/14/2011   Procedure: COLONOSCOPY;  Surgeon: Corbin Ade, MD;  Location: [facility];  Service: Endoscopy;  Laterality: N/A;  10:00   EXCISIONAL HEMORRHOIDECTOMY     INTRAOPERATIVE TRANSTHORACIC ECHOCARDIOGRAM N/A 04/21/2018   Procedure: INTRAOPERATIVE TRANSTHORACIC ECHOCARDIOGRAM;  Surgeon: Kathleene Hazel,  MD;  Location: [facility] OR;  Service: Open Heart Surgery;  Laterality: N/A;   RIGHT/LEFT HEART CATH AND CORONARY ANGIOGRAPHY N/A 03/25/2018   Procedure: RIGHT/LEFT HEART CATH AND CORONARY ANGIOGRAPHY;  Surgeon: Kathleene Hazel, MD;  Location: [facility] INVASIVE CV LAB;  Service: Cardiovascular;  Laterality: N/A;   TRANSCATHETER AORTIC VALVE REPLACEMENT, TRANSFEMORAL  04/21/2018   TRANSCATHETER AORTIC VALVE REPLACEMENT, TRANSFEMORAL N/A 04/21/2018   Procedure: TRANSCATHETER AORTIC VALVE REPLACEMENT, TRANSFEMORAL;  Surgeon: Kathleene Hazel, MD;  Location: [facility] OR;  Service: Open Heart Surgery;  Laterality: N/A;    Medications: I have reviewed the patient's current medications. Allergies: No Known Allergies  Family History  Problem Relation Age of Onset   Stroke Mother    Heart attack Father    Liver disease Sister    Renal Disease Brother    Social History:  reports that he has been smoking cigarettes. He has a 65 pack-year smoking history. He has been exposed to tobacco smoke. He has never used smokeless tobacco. He reports that he does not currently use alcohol. He reports that he does not use drugs.  Review of Systems  Genitourinary:  Positive for difficulty urinating.  All other systems reviewed and are negative.   Physical Exam:  Vital signs in last 24 hours: Temp:  [97.3 F (36.3 C)-97.5 F (36.4 C)] 97.3 F (36.3 C) (11/14 0759) Pulse Rate:  [59-103] 63 (11/14 0759) Resp:  [13-24] 18 (11/14 0759) BP: (96-147)/(34-102) 102/41 (11/14 0759) SpO2:  [82 %-100 %] 100 % (  11/14 0759) FiO2 (%):  [60 %-80 %] 70 % (11/14 0752) Physical Exam Vitals reviewed.  Constitutional:      Appearance: Normal appearance.  HENT:     Head: Normocephalic and atraumatic.     Nose: Nose normal.     Mouth/Throat:     Mouth: Mucous membranes are dry.  Eyes:     Extraocular Movements: Extraocular movements intact.     Pupils: Pupils are equal, round, and reactive to light.  Cardiovascular:      Rate and Rhythm: Normal rate and regular rhythm.  Pulmonary:     Effort: Pulmonary effort is normal. No respiratory distress.  Abdominal:     General: Abdomen is flat. There is no distension.  Genitourinary:    Penis: Phimosis present.      Testes: Normal.  Musculoskeletal:     Cervical back: Normal range of motion and neck supple.  Skin:    General: Skin is warm and dry.  Neurological:     Mental Status: He is alert and oriented to person, place, and time.  Psychiatric:        Mood and Affect: Mood normal.        Behavior: Behavior normal.        Thought Content: Thought content normal.        Judgment: Judgment normal.     Laboratory Data:  Results for orders placed or performed during the hospital encounter of 07/03/23 (from the past 72 hour(s))  Glucose, capillary     Status: Abnormal   Collection Time: 07/07/23  7:22 PM  Result Value Ref Range   Glucose-Capillary 178 (H) 70 - 99 mg/dL    Comment: Glucose reference range applies only to samples taken after fasting for at least 8 hours.  Blood gas, arterial     Status: Abnormal   Collection Time: 07/07/23  7:49 PM  Result Value Ref Range   FIO2 36.0 %   pH, Arterial 7.43 7.35 - 7.45   pCO2 arterial 43 32 - 48 mmHg   pO2, Arterial 56 (L) 83 - 108 mmHg   Bicarbonate 28.5 (H) 20.0 - 28.0 mmol/L   Acid-Base Excess 3.8 (H) 0.0 - 2.0 mmol/L   O2 Saturation 87.8 %   Patient temperature 37.7    Collection site RIGHT RADIAL    Drawn by [id number]    Allens test (pass/fail) PASS PASS    Comment: Performed at [facility], [address], [place], [place] [address]  D-dimer, quantitative     Status: Abnormal   Collection Time: 07/07/23  8:01 PM  Result Value Ref Range   D-Dimer, Quant 12.05 (H) 0.00 - 0.50 ug/mL-FEU    Comment: (NOTE) At the manufacturer cut-off value of 0.5 g/mL FEU, this assay has a negative predictive value of 95-100%.This assay is intended for use in conjunction with a clinical pretest probability (PTP)  assessment model to exclude pulmonary embolism (PE) and deep venous thrombosis (DVT) in outpatients suspected of PE or DVT. Results should be correlated with clinical presentation. Performed at [facility], [address], [place], [place] [address]   Magnesium     Status: None   Collection Time: 07/07/23  8:02 PM  Result Value Ref Range   Magnesium 2.0 1.7 - 2.4 mg/dL    Comment: Performed at [facility], [address], [place], [place] [address]  TSH     Status: None   Collection Time: 07/07/23  8:02 PM  Result Value Ref Range   TSH 0.927  0.350 - 4.500 uIU/mL    Comment: Performed by a 3rd Generation assay with a functional sensitivity of <=0.01 uIU/mL. Performed at [facility], [address] [place], [place] [address]   Troponin I (High Sensitivity)     Status: Abnormal   Collection Time: 07/07/23  8:02 PM  Result Value Ref Range   Troponin I (High Sensitivity) 34 (H) <18 ng/L    Comment: (NOTE) Elevated high sensitivity troponin I (hsTnI) values and significant  changes across serial measurements may suggest ACS but many other  chronic and acute conditions are known to elevate hsTnI results.  Refer to the "Links" section for chest pain algorithms and additional  guidance. Performed at [facility], [address] [place], [place] [address]   Lactic acid, plasma     Status: None   Collection Time: 07/08/23  2:43 AM  Result Value Ref Range   Lactic Acid, Venous 1.4 0.5 - 1.9 mmol/L    Comment: Performed at [facility], [address] [place], [place] [address]  Culture, blood (Routine X 2) w Reflex to ID Panel     Status: None (Preliminary result)   Collection Time: 07/08/23  2:43 AM   Specimen: BLOOD  Result Value Ref Range   Specimen Description BLOOD BLOOD RIGHT HAND    Special Requests      BOTTLES DRAWN AEROBIC AND ANAEROBIC Blood Culture adequate volume   Culture      NO GROWTH 1 DAY Performed at [facility], [address], [place], [place] [address]     Report Status PENDING   Culture, blood (Routine X 2) w Reflex to ID Panel     Status: None (Preliminary result)   Collection Time: 07/08/23  2:43 AM   Specimen: BLOOD  Result Value Ref Range   Specimen Description BLOOD LEFT ANTECUBITAL    Special Requests      BOTTLES DRAWN AEROBIC AND ANAEROBIC Blood Culture results may not be optimal due to an excessive volume of blood received in culture bottles   Culture      NO GROWTH 1 DAY Performed at [facility], [address], [place], [place] [address]    Report Status PENDING   Procalcitonin     Status: None   Collection Time: 07/08/23  2:43 AM  Result Value Ref Range   Procalcitonin 2.01 ng/mL    Comment:        Interpretation: PCT > 2 ng/mL: Systemic infection (sepsis) is likely, unless other causes are known. (NOTE)       Sepsis PCT Algorithm           Lower Respiratory Tract                                      Infection PCT Algorithm    ----------------------------     ----------------------------         PCT < 0.25 ng/mL                PCT < 0.10 ng/mL          Strongly encourage             Strongly discourage   discontinuation of antibiotics    initiation of antibiotics    ----------------------------     -----------------------------       PCT 0.25 - 0.50 ng/mL            PCT  0.10 - 0.25 ng/mL               OR       >80% decrease in PCT            Discourage initiation of                                            antibiotics      Encourage discontinuation           of antibiotics    ----------------------------     -----------------------------         PCT >= 0.50 ng/mL              PCT 0.26 - 0.50 ng/mL               AND       <80% decrease in PCT              Encourage initiation of                                             antibiotics       Encourage continuation           of antibiotics    ----------------------------     -----------------------------        PCT >= 0.50 ng/mL                  PCT > 0.50  ng/mL               AND         increase in PCT                  Strongly encourage                                      initiation of antibiotics    Strongly encourage escalation           of antibiotics                                     -----------------------------                                           PCT <= 0.25 ng/mL                                                 OR                                        > 80% decrease in PCT  Discontinue / Do not initiate                                             antibiotics  Performed at [facility], [address], [place], [place] [address]   Blood gas, arterial     Status: None   Collection Time: 07/08/23  2:55 AM  Result Value Ref Range   pH, Arterial 7.36 7.35 - 7.45   pCO2 arterial 44 32 - 48 mmHg   pO2, Arterial 85 83 - 108 mmHg   Bicarbonate 25.4 20.0 - 28.0 mmol/L   Acid-base deficit 0.4 0.0 - 2.0 mmol/L   O2 Saturation 97.7 %   Patient temperature 36.7    Collection site RIGHT RADIAL    Drawn by [id number]    Allens test (pass/fail) PASS PASS    Comment: Performed at [facility], [address] [place], [place] [address]  Lactic acid, plasma     Status: None   Collection Time: 07/08/23  5:11 AM  Result Value Ref Range   Lactic Acid, Venous 1.2 0.5 - 1.9 mmol/L    Comment: Performed at [facility], [address] [place], [place] [address]  Urinalysis, Routine w reflex microscopic -Urine, Random     Status: Abnormal   Collection Time: 07/08/23  5:23 AM  Result Value Ref Range   Color, Urine YELLOW YELLOW   APPearance CLEAR CLEAR   Specific Gravity, Urine 1.020 1.005 - 1.030   pH 5.5 5.0 - 8.0   Glucose, UA NEGATIVE NEGATIVE mg/dL   Hgb urine dipstick NEGATIVE NEGATIVE   Bilirubin Urine NEGATIVE NEGATIVE   Ketones, ur NEGATIVE NEGATIVE mg/dL   Protein, ur 578 (A) NEGATIVE mg/dL   Nitrite NEGATIVE NEGATIVE   Leukocytes,Ua NEGATIVE NEGATIVE    Comment: Performed at [facility], [address], [place], [place] [address]  Urinalysis, Microscopic (reflex)     Status: Abnormal   Collection Time: 07/08/23  5:23 AM  Result Value Ref Range   RBC / HPF NONE SEEN 0 - 5 RBC/hpf   WBC, UA NONE SEEN 0 - 5 WBC/hpf   Bacteria, UA RARE (A) NONE SEEN   Squamous Epithelial / HPF NONE SEEN 0 - 5 /HPF    Comment: Performed at [facility], [address], [place], [place] [address]  Heparin level (unfractionated)     Status: Abnormal   Collection Time: 07/08/23  2:31 PM  Result Value Ref Range   Heparin Unfractionated <0.10 (L) 0.30 - 0.70 IU/mL    Comment: (NOTE) The clinical reportable range upper limit is being lowered to >1.10 to align with the FDA approved guidance for the current laboratory assay.  If heparin results are below expected values, and patient dosage has  been confirmed, suggest follow up testing of antithrombin III levels. Performed at [facility], [address], [place], [place] [address]   Heparin level (unfractionated)     Status: Abnormal   Collection Time: 07/08/23 11:52 PM  Result Value Ref Range   Heparin Unfractionated <0.10 (L) 0.30 - 0.70 IU/mL    Comment: (NOTE) The clinical reportable range upper limit is being lowered to >1.10 to align with the FDA approved guidance for the current laboratory assay.  If heparin results are below expected values, and patient dosage has  been confirmed, suggest follow up testing of antithrombin III levels. Performed at [facility], [address] [place], [place] [address]   Basic metabolic panel     Status: Abnormal   Collection Time: 07/09/23  2:19 AM  Result Value Ref Range   Sodium 134 (L) 135 - 145 mmol/L   Potassium 3.9 3.5 - 5.1 mmol/L   Chloride 108 98 - 111 mmol/L   CO2 21 (L) 22 - 32 mmol/L   Glucose, Bld 145 (H) 70 - 99 mg/dL    Comment: Glucose reference range applies only to samples taken after fasting for at least 8 hours.   BUN 27 (H) 8 - 23 mg/dL   Creatinine, Ser 6.21 0.61 - 1.24  mg/dL   Calcium 7.0 (L) 8.9 - 10.3 mg/dL   GFR, Estimated >30 >86 mL/min    Comment: (NOTE) Calculated using the CKD-EPI Creatinine Equation (2021)    Anion gap 5 5 - 15    Comment: Performed at [facility], [address] [place], [place] [address]  CBC     Status: Abnormal   Collection Time: 07/09/23  2:19 AM  Result Value Ref Range   WBC 12.1 (H) 4.0 - 10.5 K/uL   RBC 3.27 (L) 4.22 - 5.81 MIL/uL   Hemoglobin 10.2 (L) 13.0 - 17.0 g/dL   HCT 96.2 (L) 95.2 - 84.1 %   MCV 98.5 80.0 - 100.0 fL   MCH 31.2 26.0 - 34.0 pg   MCHC 31.7 30.0 - 36.0 g/dL   RDW 32.4 40.1 - 02.7 %   Platelets 199 150 - 400 K/uL   nRBC 0.2 0.0 - 0.2 %    Comment: Performed at [facility], [address] [place], [place] [address]  Heparin level (unfractionated)     Status: Abnormal   Collection Time: 07/09/23 10:02 AM  Result Value Ref Range   Heparin Unfractionated 0.12 (L) 0.30 - 0.70 IU/mL    Comment: (NOTE) The clinical reportable range upper limit is being lowered to >1.10 to align with the FDA approved guidance for the current laboratory assay.  If heparin results are below expected values, and patient dosage has  been confirmed, suggest follow up testing of antithrombin III levels. Performed at [facility], [address], [place], [place] [address]   Heparin level (unfractionated)     Status: Abnormal   Collection Time: 07/09/23  8:53 PM  Result Value Ref Range   Heparin Unfractionated 0.16 (L) 0.30 - 0.70 IU/mL    Comment: (NOTE) The clinical reportable range upper limit is being lowered to >1.10 to align with the FDA approved guidance for the current laboratory assay.  If heparin results are below expected values, and patient dosage has  been confirmed, suggest follow up testing of antithrombin III levels. Performed at [facility], [address], [place], [place] [address]   Heparin level (unfractionated)     Status: None   Collection Time: 07/10/23  4:42 AM  Result Value Ref Range    Heparin Unfractionated 0.46 0.30 - 0.70 IU/mL    Comment: (NOTE) The clinical reportable range upper limit is being lowered to >1.10 to align with the FDA approved guidance for the current laboratory assay.  If heparin results are below expected values, and patient dosage has  been confirmed, suggest follow up testing of antithrombin III levels. Performed at [facility], [address] [place], [place] [address]   CBC     Status: Abnormal   Collection Time: 07/10/23  4:42 AM  Result Value Ref Range   WBC 11.5 (H) 4.0 -  10.5 K/uL   RBC 3.14 (L) 4.22 - 5.81 MIL/uL   Hemoglobin 9.7 (L) 13.0 - 17.0 g/dL   HCT 16.1 (L) 09.6 - 04.5 %   MCV 97.5 80.0 - 100.0 fL   MCH 30.9 26.0 - 34.0 pg   MCHC 31.7 30.0 - 36.0 g/dL   RDW 40.9 81.1 - 91.4 %   Platelets 178 150 - 400 K/uL   nRBC 0.2 0.0 - 0.2 %    Comment: Performed at [facility], [address], [place], [place] [address]   Recent Results (from the past 240 hour(s))  MRSA Next Gen by PCR, Nasal     Status: None   Collection Time: 07/03/23  8:32 PM   Specimen: Nasal Mucosa; Nasal Swab  Result Value Ref Range Status   MRSA by PCR Next Gen NOT DETECTED NOT DETECTED Final    Comment: (NOTE) The GeneXpert MRSA Assay (FDA approved for NASAL specimens only), is one component of a comprehensive MRSA colonization surveillance program. It is not intended to diagnose MRSA infection nor to guide or monitor treatment for MRSA infections. Test performance is not FDA approved in patients less than 75 years old. Performed at [facility], [address], [place], [place] [address]   Culture, blood (Routine X 2) w Reflex to ID Panel     Status: None (Preliminary result)   Collection Time: 07/08/23  2:43 AM   Specimen: BLOOD  Result Value Ref Range Status   Specimen Description BLOOD BLOOD RIGHT HAND  Final   Special Requests   Final    BOTTLES DRAWN AEROBIC AND ANAEROBIC Blood Culture adequate volume   Culture   Final    NO GROWTH 1  DAY Performed at [facility], [address] [place], [place] [address]    Report Status PENDING  Incomplete  Culture, blood (Routine X 2) w Reflex to ID Panel     Status: None (Preliminary result)   Collection Time: 07/08/23  2:43 AM   Specimen: BLOOD  Result Value Ref Range Status   Specimen Description BLOOD LEFT ANTECUBITAL  Final   Special Requests   Final    BOTTLES DRAWN AEROBIC AND ANAEROBIC Blood Culture results may not be optimal due to an excessive volume of blood received in culture bottles   Culture   Final    NO GROWTH 1 DAY Performed at [facility], [address] [place], [place] [address]    Report Status PENDING  Incomplete   Creatinine: Recent Labs    07/03/23 1229 07/04/23 0620 07/09/23 0219  CREATININE 1.05 1.03 0.90   Baseline Creatinine: 1  Foley Catheter Placemenet--Complex  Patient was prepped and draped in the usual sterile fashion using betadine solution. 2% viscous lidocaine was inserted per urethra. A 0.038 sensor was advanced per urethra without significant resistance or recoiling. Upon passage through the bladder neck with the wire urine was noted to emanate from the urethral meatus. We then advanced a 24 french balloon dilator over the wire and across the penile urethral stricture. We then inflated the balloon to 16cm of water and held it for 2 minutes. We then deflated the balloon. A 16 Fr council catheter was placed via the Blitz technique and passed easily with immediate return of >400 cc of clear, amber colored urine without clot. 10 cc sterile water was placed in the balloon port.    Impression/Assessment:  88yo with phimosis and urethral stricture  Plan:  16 french council foley was placed with the aid  of a urethral balloon dilator. The foley should remain in place for 7 days prior to attempting a voiding trial  Wilkie Aye 07/10/2023, 8:06 AM

## 2023-07-10 NOTE — Progress Notes (Signed)
Speech Language Pathology Treatment: Dysphagia  Patient Details Name: Dick Angello MRN: [id number] DOB: 11-26-1934 Today's Date: 07/10/2023 Time: 4010-2725 SLP Time Calculation (min) (ACUTE ONLY): 24 min  Assessment / Plan / Recommendation Clinical Impression  ONgoing dysphagia treatment provided; Pt is not medically appropriate to go off the floor for MBSS at this time (on heated high flow O2). Pt was provided trials to determine PO readiness. Pt demonstrated delayed very wet congested coughing with limited thin liquid trials. Pt was then provided two tsp bites of puree and Pt's O2 dropped to 80 and RN and respiratory were called. By the time respiratory arrived Pt's O2 was back ~90. Ended trials after PT's O2 dropped, recommend continue NPO status at this time. Recommend space out meds, as able, and provide them one at a time whole with applesauce. ST will continue to follow acutely for goals of care and will tentatively plan for MBSS tomorrow if Pt is medically appropriate. Thank you,      HPI HPI: 88 y.o. male, past medical history of COPD, on 2 L nasal cannula at home intermittently, severe aortic stenosis status post TAVR, hypertension, dementia admitted on 07/03/2023 with left hip fracture after mechanical fall at a convenience store, underwent left hip ORIF on 07/04/2023, was on aspirin for DVT prophylaxis.  -Developed acute on chronic hypoxic respiratory failure on 07/07/2023 was found to have extensive pneumonia especially on the right along with 2 small subsegmental PE in the right lower lobe  -Severe hypoxia persist requiring BiPAP alternating with high flow nasal cannula      SLP Plan  MBS;Continue with current plan of care      Recommendations for follow up therapy are one component of a multi-disciplinary discharge planning process, led by the attending physician.  Recommendations may be updated based on patient status, additional functional criteria and insurance  authorization.    Recommendations  Diet recommendations: NPO Liquids provided via: Cup;Straw Medication Administration: Whole meds with puree Supervision: Staff to assist with self feeding Compensations: Slow rate;Small sips/bites;Multiple dry swallows after each bite/sip Postural Changes and/or Swallow Maneuvers: Seated upright 90 degrees;Upright 30-60 min after meal                  Oral care BID;Staff/trained caregiver to provide oral care     Dysphagia, unspecified (R13.10)     MBS;Continue with current plan of care    Teoman Giraud H. Romie Levee, CCC-SLP Speech Language Pathologist  Georgetta Haber  07/10/2023, 2:54 PM

## 2023-07-10 NOTE — Progress Notes (Signed)
Progress Note  Patient: Travis Murphy GMW:[id number] DOB: 04/28/1935 DOA: 07/03/2023     7 DOS: the patient was seen and examined on 07/10/2023   Brief summary  88 y.o. male, past medical history of COPD, on 2 L nasal cannula at home intermittently, severe aortic stenosis status post TAVR, hypertension, dementia admitted on 07/03/2023 with left hip fracture after mechanical fall at a convenience store, underwent left hip ORIF on 07/04/2023, was on aspirin for DVT prophylaxis. -Developed acute on chronic hypoxic respiratory failure on 07/07/2023 was found to have extensive pneumonia especially on the right along with 2 small subsegmental PE in the right lower lobe -Severe hypoxia persist requiring BiPAP alternating with high flow nasal cannula -Patient and family request DNR/DNI, they do want to treat the treatable including pneumonia and PE   Assessment and Plan:  1)Bil PNA --Rt > Lt --- ??  Hospital-acquired  -CTA chest with extensive bilateral pneumonia right more than left continue IV Zosyn and Zosyn started on 07/08/2023 -Consider stopping IV vancomycin after 07/10/2023 dose (MRSA negative) -Continue bronchodilators and mucolytic's  -Blood cultures from 07/08/2023 NGTD -Repeat chest x-ray on 07/10/2023 with worsening multifocal pneumonia -- Speech pathologist recommends n.p.o. status except for medications at this time due to aspiration concerns  2)Acute Pulmonary Embolism-- --CTA chest shows  2 small subsegmental PE in the right lower lobe -Echo with EF of 60 to 65%, -No right heart strain -Continue IV heparin  3) acute COPD exacerbation--- continue IV Solu-Medrol, bronchodilators, mucolytics and antibiotics  4)Acute on chronic hypoxic respiratory failure--prior to admission patient was on 2 L of oxygen via nasal cannula -Severe hypoxia persist--- requiring high flow nasal cannula with FiO2 of 60 to 70% and flow rate of 20 to 25 L  5)Lt Hip Fx---admitted on 07/03/2023 with  left hip fracture after mechanical fall at a convenience store, underwent left hip ORIF on 07/04/2023,  -Patient will need transfer to SNF facility once medically stable  6)-social/ethics--- plan of care discussed with patient and 2 daughters at bedside --Patient and family request DNR/DNI, they do want to treat the treatable including pneumonia and PE  7)Dementia -Continue Aricept  -No behavioral disturbances currently appreciated. -Continue constant reorientation and supportive care.Marland Kitchen    8)PAFiB--- now with A-fib with RVR -Continue IV amiodarone drip -Soft blood pressure limiting the use of other rate controlling agents. -Continue to follow heart rate -Stabilize electrolytes as needed with a goal potassium above 4 and magnesium above 2.  9)GERD-- -Continue PPI.  10)chronic diastolic heart failure/status post TAVR--echo from 07/16/2023 with EF of 60 to 65% -Evidence of prior aortic valve repair/TAVR,  no aortic stenosis Euvolemic, stable and compensated -Continue to follow daily weights/I's and O's. -Low-sodium diet discussed with patient.  11)-constipation -c/n the use of MiraLAX and Colace -PRN dulcolax suppository for severe constipation.  12)Persistent hypotension/hemodynamic instability--due to #1 and #2 above -Weaned off IV Neo-Synephrine for pressure support --BP has improved   CRITICAL CARE Performed by: Shon Hale   Total critical care time: 47 minutes  Critical care time was exclusive of separately billable procedures and treating other patients.  Severe hypoxia persist--- requiring high flow nasal cannula with FiO2 of 60 to 70% and flow rate of 20 to 25 L  Critical care was necessary to treat or prevent imminent or life-threatening deterioration.  Critical care was time spent personally by me on the following activities: development of treatment plan with patient and/or surrogate as well as nursing, discussions with consultants, evaluation of patient's  response to treatment, examination of patient, obtaining history from patient or surrogate, ordering and performing treatments and interventions, ordering and review of laboratory studies, ordering and review of radiographic studies, pulse oximetry and re-evaluation of patient's condition.   Subjective:  -Aspiration concerns persist -daughters x 2 at bedside -Severe hypoxia persist--- requiring high flow nasal cannula with FiO2 of 60 to 70% and flow rate of 20 to 25 L  Physical Exam: Vitals:   07/10/23 1730 07/10/23 1745 07/10/23 1800 07/10/23 1801  BP: (!) 118/55 (!) 124/43 (!) 125/50 (!) 125/50  Pulse: 72 72 71 74  Resp: (!) 22 (!) 23 (!) 22 (!) 22  Temp:      TempSrc:      SpO2: 96% 92% 91% 92%  Weight:      Height:        Physical Exam  Gen:- Awake Alert, conversational dyspnea, HEENT:- [place].AT, No sclera icterus Nose--Heated High flow nasal cannula Neck-Supple Neck,No JVD,.  Lungs-diminished breath sounds with scattered wheezes and rhonchi CV- S1, S2 normal, irregular  abd-  +ve B.Sounds, Abd Soft, No tenderness,    Extremity/Skin:-  good pedal pulses , left hip postop wound is hemostatic Psych-affect is appropriate, oriented x3 Neuro-generalized weakness, no new focal deficits, no tremors  Family Communication: Daughters Dois Davenport and [place] at bedside.  Disposition:--- SNF when medically stable Status is: Inpatient  Author: Shon Hale, MD 07/10/2023 6:19 PM  For on call review [email or web address].

## 2023-07-10 NOTE — Progress Notes (Addendum)
PHARMACY - ANTICOAGULATION CONSULT NOTE  Pharmacy Consult for heparin Indication: pulmonary embolus  No Known Allergies  Patient Measurements: Height: 5\' 6"  (167.6 cm) Weight: 54.4 kg (120 lb) IBW/kg (Calculated) : 63.8  Vital Signs: Temp: 97.3 F (36.3 C) (11/14 0759) Temp Source: Axillary (11/14 0759) BP: 102/41 (11/14 0759) Pulse Rate: 63 (11/14 0759)  Labs: Recent Labs    07/07/23 2002 07/08/23 1431 07/09/23 0219 07/09/23 1002 07/09/23 2053 07/10/23 0442  HGB  --   --  10.2*  --   --  9.7*  HCT  --   --  32.2*  --   --  30.6*  PLT  --   --  199  --   --  178  HEPARINUNFRC  --    < >  --  0.12* 0.16* 0.46  CREATININE  --   --  0.90  --   --   --   TROPONINIHS 34*  --   --   --   --   --    < > = values in this interval not displayed.    Estimated Creatinine Clearance: 45.3 mL/min (by C-G formula based on SCr of 0.9 mg/dL).  Assessment: 88yo male had been set for hospital discharge on 11/11 after stay for a hip fracture and had ortho surgery on 11/8 with post-op DVT Px w/ ASA; respiratory status changed prior to discharge and pt had CXR and CTA which reveals extensive PNA and less-significant acute PE, already started on ABX, now to start heparin. D-dimer 12.05 Heparin level continues to be at goal this afternoon on 1550 units/hr. No bleeding or IV issues noted. CBC stable overnight.   Goal of Therapy:  Heparin level 0.3-0.7 units/ml Monitor platelets by anticoagulation protocol: Yes   Plan:  Continue heparin infusion at 1550 units/hr. Check Anti-Xa level daily Monitor heparin levels and CBC.  Thank you for involving pharmacy in this patient's care.   Sheppard Coil PharmD., BCPS Clinical Pharmacist 07/10/2023 8:55 AM

## 2023-07-10 NOTE — TOC Benefit Eligibility Note (Signed)

## 2023-07-11 DIAGNOSIS — S72002S Fracture of unspecified part of neck of left femur, sequela: Secondary | ICD-10-CM

## 2023-07-11 DIAGNOSIS — J9611 Chronic respiratory failure with hypoxia: Secondary | ICD-10-CM | POA: Diagnosis not present

## 2023-07-11 DIAGNOSIS — A419 Sepsis, unspecified organism: Secondary | ICD-10-CM

## 2023-07-11 DIAGNOSIS — R1310 Dysphagia, unspecified: Secondary | ICD-10-CM

## 2023-07-11 DIAGNOSIS — Z515 Encounter for palliative care: Secondary | ICD-10-CM

## 2023-07-11 DIAGNOSIS — I350 Nonrheumatic aortic (valve) stenosis: Secondary | ICD-10-CM | POA: Diagnosis not present

## 2023-07-11 DIAGNOSIS — I2699 Other pulmonary embolism without acute cor pulmonale: Secondary | ICD-10-CM | POA: Diagnosis not present

## 2023-07-11 DIAGNOSIS — J189 Pneumonia, unspecified organism: Secondary | ICD-10-CM

## 2023-07-11 DIAGNOSIS — S62002D Unspecified fracture of navicular [scaphoid] bone of left wrist, subsequent encounter for fracture with routine healing: Secondary | ICD-10-CM

## 2023-07-11 LAB — COMPREHENSIVE METABOLIC PANEL
ALT: 11 U/L (ref 0–44)
AST: 15 U/L (ref 15–41)
Albumin: 1.9 g/dL — ABNORMAL LOW (ref 3.5–5.0)
Alkaline Phosphatase: 57 U/L (ref 38–126)
Anion gap: 9 (ref 5–15)
BUN: 39 mg/dL — ABNORMAL HIGH (ref 8–23)
CO2: 22 mmol/L (ref 22–32)
Calcium: 7.6 mg/dL — ABNORMAL LOW (ref 8.9–10.3)
Chloride: 105 mmol/L (ref 98–111)
Creatinine, Ser: 1.2 mg/dL (ref 0.61–1.24)
GFR, Estimated: 59 mL/min — ABNORMAL LOW (ref >60)
Glucose, Bld: 150 mg/dL — ABNORMAL HIGH (ref 70–99)
Potassium: 3.8 mmol/L (ref 3.5–5.1)
Sodium: 136 mmol/L (ref 135–145)
Total Bilirubin: 1.1 mg/dL (ref <1.2)
Total Protein: 5.2 g/dL — ABNORMAL LOW (ref 6.5–8.1)

## 2023-07-11 LAB — CBC
HCT: 30.9 % — ABNORMAL LOW (ref 39.0–52.0)
Hemoglobin: 9.8 g/dL — ABNORMAL LOW (ref 13.0–17.0)
MCH: 30.6 pg (ref 26.0–34.0)
MCHC: 31.7 g/dL (ref 30.0–36.0)
MCV: 96.6 fL (ref 80.0–100.0)
Platelets: 192 10*3/uL (ref 150–400)
RBC: 3.2 MIL/uL — ABNORMAL LOW (ref 4.22–5.81)
RDW: 14.3 % (ref 11.5–15.5)
WBC: 13.8 10*3/uL — ABNORMAL HIGH (ref 4.0–10.5)
nRBC: 0.1 % (ref 0.0–0.2)

## 2023-07-11 LAB — HEPARIN LEVEL (UNFRACTIONATED): Heparin Unfractionated: 0.26 [IU]/mL — ABNORMAL LOW (ref 0.30–0.70)

## 2023-07-11 MED ORDER — GLYCOPYRROLATE 0.2 MG/ML IJ SOLN
0.2000 mg | INTRAMUSCULAR | Status: DC | PRN
  Administered 2023-07-11: 0.2 mg via INTRAVENOUS
  Filled 2023-07-11: qty 1

## 2023-07-11 MED ORDER — MORPHINE SULFATE (PF) 2 MG/ML IV SOLN
2.0000 mg | INTRAVENOUS | Status: DC | PRN
  Administered 2023-07-11 – 2023-07-12 (×4): 4 mg via INTRAVENOUS
  Filled 2023-07-11 (×4): qty 2

## 2023-07-11 MED ORDER — POLYVINYL ALCOHOL 1.4 % OP SOLN: 1.0000 [drp] | Freq: Four times a day (QID) | OPHTHALMIC | Status: DC | PRN

## 2023-07-11 MED ORDER — ACETAMINOPHEN 650 MG RE SUPP: 650.0000 mg | Freq: Four times a day (QID) | RECTAL | Status: DC | PRN

## 2023-07-11 MED ORDER — LORAZEPAM 2 MG/ML IJ SOLN
1.0000 mg | INTRAMUSCULAR | Status: DC | PRN
  Administered 2023-07-11: 1 mg via INTRAVENOUS
  Filled 2023-07-11: qty 1

## 2023-07-11 MED ORDER — ONDANSETRON HCL 4 MG/2ML IJ SOLN: 4.0000 mg | Freq: Four times a day (QID) | INTRAMUSCULAR | Status: DC | PRN

## 2023-07-11 MED ORDER — GLYCOPYRROLATE 0.2 MG/ML IJ SOLN: 0.2000 mg | INTRAMUSCULAR | Status: DC | PRN

## 2023-07-11 MED ORDER — ACETAMINOPHEN 325 MG PO TABS: 650.0000 mg | ORAL_TABLET | Freq: Four times a day (QID) | ORAL | Status: DC | PRN

## 2023-07-11 MED ORDER — LORAZEPAM 1 MG PO TABS: 1.0000 mg | ORAL_TABLET | ORAL | Status: DC | PRN

## 2023-07-11 MED ORDER — ONDANSETRON 4 MG PO TBDP: 4.0000 mg | ORAL_TABLET | Freq: Four times a day (QID) | ORAL | Status: DC | PRN

## 2023-07-11 MED ORDER — BIOTENE DRY MOUTH MT LIQD: 15.0000 mL | OROMUCOSAL | Status: DC | PRN

## 2023-07-11 MED ORDER — GLYCOPYRROLATE 1 MG PO TABS: 1.0000 mg | ORAL_TABLET | ORAL | Status: DC | PRN

## 2023-07-11 MED ORDER — DIPHENHYDRAMINE HCL 50 MG/ML IJ SOLN: 12.5000 mg | INTRAMUSCULAR | Status: DC | PRN

## 2023-07-11 MED ORDER — LORAZEPAM 2 MG/ML PO CONC: 1.0000 mg | ORAL | Status: DC | PRN

## 2023-07-11 NOTE — Plan of Care (Signed)
  Problem: Education: Goal: Knowledge of General Education information will improve Description: Including pain rating scale, medication(s)/side effects and non-pharmacologic comfort measures Outcome: Not Progressing   Problem: Health Behavior/Discharge Planning: Goal: Ability to manage health-related needs will improve Outcome: Not Progressing   Problem: Clinical Measurements: Goal: Ability to maintain clinical measurements within normal limits will improve Outcome: Not Progressing Goal: Will remain free from infection Outcome: Not Progressing Goal: Diagnostic test results will improve Outcome: Not Progressing

## 2023-07-11 NOTE — Progress Notes (Signed)
Subjective: 7 Days Post-Op Procedure(s) (LRB): INTRAMEDULLARY (IM) NAIL INTERTROCHANTERIC (Left) Patient reports pain as mild.    Objective: Vital signs in last 24 hours: Temp:  [97.6 F (36.4 C)-97.9 F (36.6 C)] 97.9 F (36.6 C) (11/14 2300) Pulse Rate:  [55-107] 63 (11/15 0600) Resp:  [14-27] 14 (11/15 0600) BP: (96-155)/(37-99) 115/48 (11/15 0600) SpO2:  [84 %-100 %] 98 % (11/15 0600) FiO2 (%):  [60 %-80 %] 80 % (11/15 0741)  Intake/Output from previous day: 11/14 0701 - 11/15 0700 In: 1582.1 [I.V.:1188.7; IV Piggyback:393.4] Out: 625 [Urine:625] Intake/Output this shift: No intake/output data recorded.  Recent Labs    07/09/23 0219 07/10/23 0442 07/11/23 0451  HGB 10.2* 9.7* 9.8*   Recent Labs    07/10/23 0442 07/11/23 0451  WBC 11.5* 13.8*  RBC 3.14* 3.20*  HCT 30.6* 30.9*  PLT 178 192   Recent Labs    07/09/23 0219 07/11/23 0451  NA 134* 136  K 3.9 3.8  CL 108 105  CO2 21* 22  BUN 27* 39*  CREATININE 0.90 1.20  GLUCOSE 145* 150*  CALCIUM 7.0* 7.6*   No results for input(s): "LABPT", "INR" in the last 72 hours.   Assessment/Plan: 7 Days Post-Op Procedure(s) (LRB): INTRAMEDULLARY (IM) NAIL INTERTROCHANTERIC (Left)    7 DAYS AFTER I'm NAIL LEFT HIP  RESPIRATORY ISSUES AS EXPECTED COMPOUNDED BY PE AND PNA   FOLLOWING ORTHOPEDIC ISSUS AND CURRENTLY THEY ARE STABLE    Fuller Canada 07/11/2023, 8:44 AM

## 2023-07-11 NOTE — Progress Notes (Signed)
PHARMACY - ANTICOAGULATION CONSULT NOTE  Pharmacy Consult for heparin Indication: pulmonary embolus  No Known Allergies  Patient Measurements: Height: 5\' 6"  (167.6 cm) Weight: 54.4 kg (120 lb) IBW/kg (Calculated) : 63.8  Vital Signs: Temp: 97.7 F (36.5 C) (11/15 0900) Temp Source: Oral (11/15 0900) BP: 115/50 (11/15 0900) Pulse Rate: 61 (11/15 0900)  Labs: Recent Labs    07/09/23 0219 07/09/23 1002 07/10/23 0442 07/10/23 1139 07/11/23 0451  HGB 10.2*  --  9.7*  --  9.8*  HCT 32.2*  --  30.6*  --  30.9*  PLT 199  --  178  --  192  HEPARINUNFRC  --    < > 0.46 0.41 0.26*  CREATININE 0.90  --   --   --  1.20   < > = values in this interval not displayed.    Estimated Creatinine Clearance: 34 mL/min (by C-G formula based on SCr of 1.2 mg/dL).  Assessment: 88yo male had been set for hospital discharge on 11/11 after stay for a hip fracture and had ortho surgery on 11/8 with post-op DVT Px w/ ASA; respiratory status changed prior to discharge and pt had CXR and CTA which reveals extensive PNA and less-significant acute PE, already started on ABX, now to start heparin. D-dimer 12.05   HL 0.26- slightly subtherapeutic  Goal of Therapy:  Heparin level 0.3-0.7 units/ml Monitor platelets by anticoagulation protocol: Yes   Plan:  Increase heparin infusion to 1650 units/hr. Check Anti-Xa level in 8 hours and daily Monitor heparin levels and CBC.  Thank you for involving pharmacy in this patient's care.   Judeth Cornfield, PharmD Clinical Pharmacist 07/11/2023 10:51 AM

## 2023-07-11 NOTE — Progress Notes (Signed)
   07/11/23 1055  Spiritual Encounters  Type of Visit Initial  Care provided to: Pt and family  Reason for visit Routine spiritual support  OnCall Visit No   Chaplain visited with the patient, Travis Murphy who was alert during my time visiting. When asked how he was doing, He replied, "Better!". Family and friends were with him today and thanked me for visiting.  One shared that they had just prayed and that Travis Murphy is strong in his faith.   As I departed I wished the family well, and Travis Murphy continued healing.   Valerie Roys [facility]  [phone]

## 2023-07-11 NOTE — Progress Notes (Signed)
Speech Language Pathology Treatment: Dysphagia  Patient Details Name: Copen Sheeley MRN: [id number] DOB: 07-26-1935 Today's Date: 07/11/2023 Time: 1027-2536 SLP Time Calculation (min) (ACUTE ONLY): 27 min  Assessment / Plan / Recommendation Clinical Impression  Ongoing diagnostic dysphagia therapy provided today. Both daughters at bedside; SLP informed daughters about episode of O2 dropping with PO trials yesterday (see ST note from 11/14) and reasoning for continuing NPO status. Pt's wishes are no alternative means of nutrition. Pt's respiratory status is still note stable enough to allow Pt to go off the floor for MBSS, unfortunately (Pt continues to require heated high flow oxygen). SLP provided oral care via toothettes and a single ice chip; note delayed very wet congested cough prior to po trials and after ice chip. SLP provided education regarding oral care and oral moisture with toothettes followed by a single ice chip (20 minute increments) for comfort. ST will continue to follow for goals of care and tentatively plan for MBSS when/if Pt becomes medically appropriate.Thank  you,   HPI HPI: 88 y.o. male, past medical history of COPD, on 2 L nasal cannula at home intermittently, severe aortic stenosis status post TAVR, hypertension, dementia admitted on 07/03/2023 with left hip fracture after mechanical fall at a convenience store, underwent left hip ORIF on 07/05/2023, was on aspirin for DVT prophylaxis.  -Developed acute on chronic hypoxic respiratory failure on 07/07/2023 was found to have extensive pneumonia especially on the right along with 2 small subsegmental PE in the right lower lobe  -Severe hypoxia persist requiring BiPAP alternating with high flow nasal cannula      SLP Plan  MBS;Continue with current plan of care      Recommendations for follow up therapy are one component of a multi-disciplinary discharge planning process, led by the attending physician.  Recommendations  may be updated based on patient status, additional functional criteria and insurance authorization.    Recommendations  Diet recommendations: NPO Liquids provided via: Teaspoon Medication Administration: Whole meds with puree Supervision: Staff to assist with self feeding Compensations: Slow rate;Small sips/bites;Multiple dry swallows after each bite/sip Postural Changes and/or Swallow Maneuvers: Seated upright 90 degrees;Upright 30-60 min after meal                              MBS;Continue with current plan of care    Katrese Shell H. Romie Levee, CCC-SLP Speech Language Pathologist  Georgetta Haber  07/11/2023, 2:19 PM

## 2023-07-11 NOTE — Progress Notes (Addendum)
  Interdisciplinary Goals of Care Family Meeting   Problem  Severe sepsis with septic shock due to multifocal pneumonia  Acute Bil pulmonary embolism (HCC)  Intertrochanteric Fracture of Left Hip, Closed, Initial Encounter (Hcc)  Hip Fracture (Hcc)  Acute on chronic respiratory failure with hypoxia (HCC)  Dysphagia--with aspiration  Palliative Care Encounter  Urinary Retention  Severe Aortic Valve Stenosis  COPD GOLD 2 / still smoking  Cigarette Smoker     Date carried out: 07/11/2023  Location of the meeting: Bedside  Member's involved: Physician, Bedside Registered Nurse, and Family Member or next of kin  Durable Power of Attorney or acting medical decision maker: daughters Rosey Bath and Dois Davenport    Discussion: We discussed goals of care for Travis Murphy .    Code status:   Code Status: Limited: Do not attempt resuscitation (DNR) -DNR-LIMITED -Do Not Intubate/DNI    Disposition: In-patient comfort care -Family members and patient have decided to request transition to hospice/comfort care Time spent for the meeting: 46 mins   -Anticipate in-hospital death, comfort care order set protocol initiated  Shon Hale, MD  07/11/2023, 5:08 PM

## 2023-07-11 NOTE — Progress Notes (Addendum)
Progress Note  Patient: Travis Murphy WUJ:[id number] DOB: April 03, 1935 DOA: 07/03/2023     8 DOS: the patient was seen and examined on 07/11/2023   Brief summary  88 y.o. male, past medical history of COPD, on 2 L nasal cannula at home intermittently, severe aortic stenosis status post TAVR, hypertension, dementia admitted on 07/03/2023 with left hip fracture after mechanical fall at a convenience store, underwent left hip ORIF on 07/15/2023, was on aspirin for DVT prophylaxis. -Developed acute on chronic hypoxic respiratory failure on 07/07/2023 was found to have extensive pneumonia especially on the right along with 2 small subsegmental PE in the right lower lobe -Severe hypoxia persist requiring BiPAP alternating with high flow nasal cannula -Patient and family request DNR/DNI, they do want to treat the treatable including pneumonia and PE  Problem  Severe sepsis with septic shock due to multifocal pneumonia  Acute Bil pulmonary embolism (HCC)  Intertrochanteric Fracture of Left Hip, Closed, Initial Encounter (Hcc)  Hip Fracture (Hcc)  Acute on chronic respiratory failure with hypoxia (HCC)  Dysphagia--with aspiration  Palliative Care Encounter  Urinary Retention  Severe Aortic Valve Stenosis  COPD GOLD 2 / still smoking  Cigarette Smoker      Assessment and Plan:  1)Severe Sepsis with Septic Shock due to Bil PNA --Rt > Lt --- ??  Hospital-acquired  -CTA chest with extensive bilateral pneumonia right more than left continue IV Zosyn started on 07/08/2023 -Consider stopping IV vancomycin after 07/10/2023 dose (MRSA negative) -Continue bronchodilators and mucolytic's  -Blood cultures from 07/08/2023 NGTD -Repeat chest x-ray on 07/10/2023 with worsening multifocal pneumonia  07/11/23- -presumed episode of aspiration overnight with the significant desaturation requiring intervention by respiratory therapist -- Speech pathologist recommends n.p.o. status except for medications  at this time due to aspiration concerns -Significant hypoxia persist requiring heated high flow nasal cannula 80% FiO2 and 30L/min -Patient complains of dyspnea and discomfort -Respiratory status is worsening despite IV Zosyn and Vanco since 07/08/2023 -Family members and patient are considering possible transition to hospice/comfort care  2)Acute Pulmonary Embolism-- --CTA chest shows  2 small subsegmental PE in the right lower lobe -Echo with EF of 60 to 65%, -No right heart strain -Continue IV heparin  3) acute COPD exacerbation--- continue IV Solu-Medrol, bronchodilators, mucolytics and antibiotics  4)Acute on chronic hypoxic respiratory failure--prior to admission patient was on 2 L of oxygen via nasal cannula -Severe hypoxia persist--- requiring high flow nasal cannula with FiO2 of 80% and flow rate of 30 L  5)Lt Hip Fx---admitted on 07/03/2023 with left hip fracture after mechanical fall at a convenience store, underwent left hip ORIF on 07/13/2023,  -Patient will need transfer to SNF facility once medically stable  6)-social/ethics--- plan of care discussed with patient and 2 daughters at bedside --Patient and family request DNR/DNI, they do want to treat the treatable including pneumonia and PE --Family members and patient are considering possible transition to hospice/comfort care  7)Dementia -Continue Aricept  -No behavioral disturbances currently appreciated. -Continue constant reorientation and supportive care.Marland Kitchen    8)PAFiB--- now with A-fib with RVR -Continue IV amiodarone drip -Soft blood pressure limiting the use of other rate controlling agents. -Continue to follow heart rate -Stabilize electrolytes as needed with a goal potassium above 4 and magnesium above 2.  9)GERD-- -Continue PPI.  10)chronic diastolic heart failure/status post TAVR--echo from 07/16/2023 with EF of 60 to 65% -Evidence of prior aortic valve repair/TAVR,  no aortic stenosis Euvolemic, stable  and compensated -Continue to follow daily weights/I's  and O's. -Low-sodium diet discussed with patient.  11)-constipation -c/n the use of MiraLAX and Colace -PRN dulcolax suppository for severe constipation.  12)Persistent hypotension/hemodynamic instability--due to #1 and #2 above -Weaned off IV Neo-Synephrine for pressure support --BP has improved   CRITICAL CARE Performed by: Shon Hale   Total critical care time: 53 minutes  Critical care time was exclusive of separately billable procedures and treating other patients.  Severe hypoxia persist--- requiring high flow nasal cannula with FiO2 of 80% and flow rate of 30 L - Critical care was necessary to treat or prevent imminent or life-threatening deterioration.  Critical care was time spent personally by me on the following activities: development of treatment plan with patient and/or surrogate as well as nursing, discussions with consultants, evaluation of patient's response to treatment, examination of patient, obtaining history from patient or surrogate, ordering and performing treatments and interventions, ordering and review of laboratory studies, ordering and review of radiographic studies, pulse oximetry and re-evaluation of patient's condition.   Subjective:  -daughters x 2 at bedside presumed episode of aspiration overnight with the significant desaturation requiring intervention by respiratory therapist -- Speech pathologist recommends n.p.o. status except for medications at this time due to aspiration concerns -Significant hypoxia persist requiring heated high flow nasal cannula 80% FiO2 and 30L/min -Patient complains of dyspnea and discomfort -Respiratory status is worsening despite IV Zosyn and Vanco since 07/08/2023 -Family members and patient are considering possible transition to hospice/comfort care  Physical Exam: Vitals:   07/11/23 0900 07/11/23 1100 07/11/23 1200 07/11/23 1300  BP: (!) 115/50 (!)  153/67 (!) 150/68   Pulse: 61 83 80   Resp: 15 (!) 21 (!) 23   Temp: 97.7 F (36.5 C)   (!) 97.1 F (36.2 C)  TempSrc: Oral   Axillary  SpO2: 91% 90% (!) 89%   Weight:      Height:        Physical Exam  Gen:- Awake Alert, conversational dyspnea, HEENT:- [place].AT, No sclera icterus Nose--Heated High flow nasal cannula Neck-Supple Neck,No JVD,.  Lungs-diminished breath sounds with scattered wheezes and rhonchi CV- S1, S2 normal, irregular  abd-  +ve B.Sounds, Abd Soft, No tenderness,    Extremity/Skin:-  good pedal pulses , left hip postop wound is hemostatic Psych-affect is appropriate, oriented x3 Neuro-generalized weakness, no new focal deficits, no tremors  Family Communication: Daughters Dois [place]en[place] and [place] at bedside.  Disposition:--- -Family members and patient are considering possible transition to hospice/comfort care Status is: Inpatient  Author: Shon Hale, MD 07/11/2023 4:56 PM  For on call review [email or web address].

## 2023-07-12 DIAGNOSIS — R6521 Severe sepsis with septic shock: Secondary | ICD-10-CM

## 2023-07-12 DIAGNOSIS — A419 Sepsis, unspecified organism: Secondary | ICD-10-CM

## 2023-07-13 LAB — CULTURE, BLOOD (ROUTINE X 2)
Culture: NO GROWTH
Culture: NO GROWTH
Special Requests: ADEQUATE

## 2023-07-27 NOTE — Progress Notes (Signed)
Patient transferred to the morgue by security, patient placement notified.

## 2023-07-27 NOTE — Death Summary Note (Signed)
DEATH SUMMARY   Patient Details  Name: Travis Murphy MRN: [id number] DOB: [birth year] [id number], Channing Mutters, MD Admission/Discharge Information   Admit Date:  07/29/23  Date of Death: Date of Death: 08/07/2023  Time of Death: Time of Death: 0619  Length of Stay: 9   Principle Cause of death: Severe sepsis with septic shock secondary to pneumonia  Hospital Diagnoses: Principal Problem:   Severe sepsis with septic shock due to multifocal pneumonia Active Problems:   New onset a-fib (HCC)   Acute on chronic respiratory failure with hypoxia (HCC)   Hip fracture (HCC)   Intertrochanteric fracture of left hip, closed, initial encounter (HCC)   Acute Bil pulmonary embolism (HCC)   Severe Sepsis with Septic Shock due to pneumonia [facility])   Urinary retention   Dysphagia--with aspiration   Palliative care encounter   Essential hypertension   S/P TAVR (transcatheter aortic valve replacement)   Cigarette smoker   Hyperlipemia   COPD GOLD 2 / still smoking   Protein-calorie malnutrition, severe   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 male, past medical history of COPD, on 2 L nasal cannula at home intermittently, severe aortic stenosis status post TAVR, hypertension, dementia admitted on 07/29/2023 with left hip fracture after mechanical fall at a [employer] store, underwent left hip ORIF on 07/11/2023, was on aspirin for DVT prophylaxis. -Developed acute on chronic hypoxic respiratory failure on 07/07/2023 was found to have extensive pneumonia especially on the right along with 2 small subsegmental PE in the right lower lobe -Severe hypoxia persist requiring BiPAP alternating with high flow nasal cannula in the setting of severe sepsis with septic shock requiring pressors -Patient and family request DNR/DNI,   -In the afternoon of 07/11/2023 family members and patient have decided to request transition to hospice/comfort care   Assessment and Plan: Severe  sepsis with septic shock due to multifocal pneumonia  Acute Bil pulmonary embolism (HCC)  Intertrochanteric Fracture of Left Hip, Closed, Initial Encounter (Hcc)  Hip Fracture (Hcc)  Acute on chronic respiratory failure with hypoxia (HCC)  Dysphagia--with aspiration  Palliative Care Encounter  Urinary Retention  Severe Aortic Valve Stenosis  COPD GOLD 2 / still smoking  Cigarette Smoker   1)Severe Sepsis with Septic Shock due to Bil PNA --Rt > Lt --- ??  Hospital-acquired  -CTA chest with extensive bilateral pneumonia right more than left Treated with IV Zosyn and IV vancomycin from 07/08/2023 through 07/11/2023 -Treated with bronchodilators and mucolytic's  -Blood cultures from 07/08/2023 NGTD -Repeat chest x-ray on 07/10/2023 with worsening multifocal pneumonia -presumed episode of aspiration overnight on 07/11/2023 with the significant desaturation requiring intervention by respiratory therapist -- Speech pathologist recommended n.p.o. status except for medications at this time due to aspiration concerns -Significant hypoxia persist requiring heated high flow nasal cannula 80% FiO2 and 30L/min -Patient complained of worsening dyspnea and discomfort -Respiratory status is worsening despite IV Zosyn and Vanco since 07/08/2023 --In the afternoon of 07/11/2023 family members and patient have decided to request transition to hospice/comfort care    2)Acute Pulmonary Embolism-- --CTA chest shows  2 small subsegmental PE in the right lower lobe -Echo with EF of 60 to 65%, -No right heart strain -Treated with IV heparin -In the afternoon of 07/11/2023 family members and patient have decided to request transition to hospice/comfort care    3) acute COPD exacerbation--- treated with V Solu-Medrol, bronchodilators, mucolytics and antibiotics --In the afternoon of 07/11/2023 family members and patient have decided to request transition  to hospice/comfort care    4)Acute on chronic hypoxic  respiratory failure--prior to admission patient was on 2 L of oxygen via nasal cannula -Severe hypoxia persist--- requiring high flow nasal cannula with FiO2 of 80% and flow rate of 30 L -In the afternoon of 07/11/2023 family members and patient have decided to request transition to hospice/comfort care    5)Lt Hip Fx---admitted on 07/03/2023 with left hip fracture after mechanical fall at a convenience store, underwent left hip ORIF on 07/03/2023,  -In the afternoon of 07/11/2023 family members and patient have decided to request transition to hospice/comfort care    6)-social/ethics--- plan of care discussed with patient and 2 daughters at bedside --Patient and family request DNR/DNI,  -In the afternoon of 07/11/2023 family members and patient have decided to request transition to hospice/comfort care    7)PAFiB--- now with A-fib with RVR -Treated IV amiodarone drip -Soft blood pressure limiting the use of other rate controlling agents. -In the afternoon of 07/11/2023 family members and patient have decided to request transition to hospice/comfort care \  8)chronic diastolic heart failure/status post TAVR--echo from 07/16/2023 with EF of 60 to 65% -Evidence of prior aortic valve repair/TAVR,  no aortic stenosis -In the afternoon of 07/11/2023 family members and patient have decided to request transition to hospice/comfort care    9)Persistent hypotension/hemodynamic instability--due to #1 and #2 above -Weaned off IV Neo-Synephrine for pressure support -In the afternoon of 07/11/2023 family members and patient have decided to request transition to hospice/comfort care   Consultations: Palliative care  The results of significant diagnostics from this hospitalization (including imaging, microbiology, ancillary and laboratory) are listed below for reference.   Significant Diagnostic Studies: DG CHEST PORT 1 VIEW  Result Date: 07/10/2023 CLINICAL DATA:  Shortness of breath. EXAM: PORTABLE  CHEST 1 VIEW COMPARISON:  CT chest and chest x-ray dated July 07, 2023. FINDINGS: The patient is rotated to the right. New right upper extremity PICC line with tip near the cavoatrial junction. Stable cardiomediastinal silhouette status post TAVR. Increasing diffuse reticulonodular interstitial thickening. Worsening consolidation at the right lung base with possible new small pleural effusion. Unchanged left basilar consolidation. No pneumothorax. No acute osseous abnormality. IMPRESSION: 1. Worsening multifocal pneumonia. There may be a component of pulmonary edema as well. 2. Possible new small right pleural effusion. Electronically Signed   By: Obie Dredge M.D.   On: 07/10/2023 12:57   [place] EKG SITE RITE  Result Date: 07/08/2023 If Site Rite image not attached, placement could not be confirmed due to current cardiac rhythm.  ECHOCARDIOGRAM COMPLETE  Result Date: 07/08/2023    ECHOCARDIOGRAM REPORT   Patient Name:   ALEX MENDIOLA [facility] Date of Exam: 07/08/2023 Medical Rec #:  [id number]             Height:       66.0 in Accession #:    [id number]            Weight:       120.0 lb Date of Birth:  1934/10/23             BSA:          1.610 m Patient Age:    88 years              BP:           90/48 mmHg Patient Gender: M  HR:           90 bpm. [facility] Procedure: 2D Echo, Cardiac Doppler and Color Doppler Indications:    Pulmonary Embolus I26.09  History:        Patient has prior history of Echocardiogram examinations, most                 recent 08/14/2022. COPD, Arrythmias:Atrial Fibrillation,                 Signs/Symptoms:Shortness of Breath; Risk Factors:Hypertension,                 Dyslipidemia and Former Smoker. Hx of tobacco abuse, TAVR and                 COVID-19.  Sonographer:    Celesta Gentile RCS Referring Phys: [id number] ASIA B ZIERLE-GHOSH  Sonographer Comments: Difficult parasternal windows. IMPRESSIONS  1. Left ventricular ejection fraction, by  estimation, is 60 to 65%. The left ventricle has normal function. Left ventricular endocardial border not optimally defined to evaluate regional wall motion. There is mild left ventricular hypertrophy. Left ventricular diastolic parameters are indeterminate.  2. Right ventricular systolic function is normal. The right ventricular size is normal. Tricuspid regurgitation signal is inadequate for assessing PA pressure.  3. Left atrial size was severely dilated.  4. The mitral valve is normal in structure. Trivial mitral valve regurgitation. No evidence of mitral stenosis.  5. Edwards Sapien 3 THV size 26 mm is in the aortic valve position. . The aortic valve has been repaired/replaced. Aortic valve regurgitation is mild. No aortic stenosis is present.  6. The inferior vena cava is normal in size with <50% respiratory variability, suggesting right atrial pressure of 8 mmHg. FINDINGS  Left Ventricle: Left ventricular ejection fraction, by estimation, is 60 to 65%. The left ventricle has normal function. Left ventricular endocardial border not optimally defined to evaluate regional wall motion. The left ventricular internal cavity size was normal in size. There is mild left ventricular hypertrophy. Left ventricular diastolic parameters are indeterminate. Right Ventricle: The right ventricular size is normal. Right vetricular wall thickness was not well visualized. Right ventricular systolic function is normal. Tricuspid regurgitation signal is inadequate for assessing PA pressure. Left Atrium: Left atrial size was severely dilated. Right Atrium: Right atrial size was normal in size. Pericardium: There is no evidence of pericardial effusion. Mitral Valve: The mitral valve is normal in structure. Trivial mitral valve regurgitation. No evidence of mitral valve stenosis. MV peak gradient, 10.2 mmHg. The mean mitral valve gradient is 4.0 mmHg. Tricuspid Valve: The tricuspid valve is normal in structure. Tricuspid valve  regurgitation is not demonstrated. No evidence of tricuspid stenosis. Aortic Valve: Edwards Sapien 3 THV size 26 mm is in the aortic valve position. The aortic valve has been repaired/replaced. Aortic valve regurgitation is mild. Aortic regurgitation PHT measures 344 msec. No aortic stenosis is present. Aortic valve mean gradient measures 12.0 mmHg. Aortic valve peak gradient measures 21.7 mmHg. Aortic valve area, by VTI measures 1.49 cm. Pulmonic Valve: The pulmonic valve was not well visualized. Pulmonic valve regurgitation is not visualized. No evidence of pulmonic stenosis. Aorta: The aortic root is normal in size and structure. Venous: The inferior vena cava is normal in size with less than 50% respiratory variability, suggesting right atrial pressure of 8 mmHg. IAS/Shunts: No atrial level shunt detected by color flow Doppler.  LEFT VENTRICLE PLAX 2D LVIDd:         4.30  cm LVIDs:         2.70 cm LV PW:         1.20 cm LV IVS:        1.10 cm LVOT diam:     2.00 cm LV SV:         65 LV SV Index:   40 LVOT Area:     3.14 cm  RIGHT VENTRICLE TAPSE (M-mode): 1.5 cm LEFT ATRIUM             Index        RIGHT ATRIUM           Index LA diam:        4.00 cm 2.49 cm/m   RA Area:     16.60 cm LA Vol (A2C):   86.7 ml 53.87 ml/m  RA Volume:   38.80 ml  24.11 ml/m LA Vol (A4C):   75.2 ml 46.72 ml/m LA Biplane Vol: 81.8 ml 50.82 ml/m  AORTIC VALVE AV Area (Vmax):    1.53 cm AV Area (Vmean):   1.50 cm AV Area (VTI):     1.49 cm AV Vmax:           233.00 cm/s AV Vmean:          163.000 cm/s AV VTI:            0.434 m AV Peak Grad:      21.7 mmHg AV Mean Grad:      12.0 mmHg LVOT Vmax:         113.33 cm/s LVOT Vmean:        77.667 cm/s LVOT VTI:          0.206 m LVOT/AV VTI ratio: 0.47 AI PHT:            344 msec  AORTA Ao Root diam: 2.90 cm MITRAL VALVE MV Area (PHT): 2.76 cm     SHUNTS MV Area VTI:   1.85 cm     Systemic VTI:  0.21 m MV Peak grad:  10.2 mmHg    Systemic Diam: 2.00 cm MV Mean grad:  4.0 mmHg MV  Vmax:       1.60 m/s MV Vmean:      79.5 cm/s MV Decel Time: 275 msec MV E velocity: 151.50 cm/s Dina Rich MD Electronically signed by Dina Rich MD Signature Date/Time: 07/08/2023/12:09:39 PM    Final    CT Angio Chest Pulmonary Embolism (PE) W or WO Contrast  Result Date: 07/08/2023 CLINICAL DATA:  Shortness of breath. Pulmonary embolism suspected, high probability. Recent rib fracture. EXAM: CT ANGIOGRAPHY CHEST WITH CONTRAST TECHNIQUE: Multidetector CT imaging of the chest was performed using the standard protocol during bolus administration of intravenous contrast. Multiplanar CT image reconstructions and MIPs were obtained to evaluate the vascular anatomy. RADIATION DOSE REDUCTION: This exam was performed according to the departmental dose-optimization program which includes automated exposure control, adjustment of the mA and/or kV according to patient size and/or use of iterative reconstruction technique. CONTRAST:  OMNIPAQUE IOHEXOL 350 MG/ML SOLN COMPARISON:  08/14/2022 FINDINGS: Cardiovascular: Satisfactory opacification of the pulmonary arteries to the segmental level. Two subsegmental pulmonary emboli seen on the right at basal and apical segments of the right lower lobe. These are marked on series 5. Normal heart size. No pericardial effusion. Extensive atheromatous calcification of the aorta and coronaries. Transcatheter aortic valve replacement. Mediastinum/Nodes: Unremarkable Lungs/Pleura: Centrilobular emphysema. Consolidation in the lower lobes, most confluent on the right. No edema, effusion, or  pneumothorax. Upper Abdomen: Large liver fissures and borderline surface lobulation. The umbilical vein is also somewhat pronounced in size. Musculoskeletal: No acute finding.  Multilevel bridging osteophyte. Review of the MIP images confirms the above findings. These results were called by telephone at the time of interpretation on 07/08/2023 at 5:34 am to provider ASIA ZIERLE-GHOSH  , who verbally acknowledged these results. IMPRESSION: 1. Two small subsegmental pulmonary emboli in the right lower lobe. 2. Extensive pneumonia at the lung bases, most confluent at the right lower lobe. 3. Possible cirrhosis, correlate with risk factors. 4. Emphysema and extensive atherosclerosis. Electronically Signed   By: Tiburcio Pea M.D.   On: 07/08/2023 05:34   DG CHEST PORT 1 VIEW  Result Date: 07/07/2023 CLINICAL DATA:  [id number] with shortness of breath and tachycardia. Hypoxemia with oxygen saturation 85% on 2 L. EXAM: PORTABLE CHEST 1 VIEW COMPARISON:  Portable chest 07/06/2023 at 8:28 a.m. FINDINGS: 4:40 p.m. the lungs are expiratory. The patient's chin obscures the medial apices. There is patchy increased airspace disease in the right upper lobe mid perihilar area, and left lower lung field consistent with pneumonia or aspiration. Small pleural effusions have also formed. The hypoexpanded lungs are otherwise clear. Stable cardiomegaly with TAVR. There is a tortuous and calcified aorta with stable mediastinum. Osteopenia and degenerative skeletal changes. IMPRESSION: 1. Patchy increased airspace disease in the right upper lobe mid perihilar area, and left lower lung field consistent with pneumonia or aspiration. 2. Small pleural effusions have also formed. 3. Stable cardiomegaly with TAVR. 4. Aortic atherosclerosis. Electronically Signed   By: Almira Bar M.D.   On: 07/07/2023 21:42   DG CHEST PORT 1 VIEW  Result Date: 07/06/2023 CLINICAL DATA:  Shortness of breath. EXAM: PORTABLE CHEST 1 VIEW COMPARISON:  07/03/2023 FINDINGS: Low volume film with asymmetric elevation right hemidiaphragm, similar to prior. The cardio pericardial silhouette is enlarged. Status post TAVR. Interstitial markings are diffusely coarsened with chronic features. Bones are diffusely demineralized. IMPRESSION: 1. Low volume film with asymmetric elevation right hemidiaphragm. 2. Chronic interstitial coarsening.  Electronically Signed   By: Kennith Center M.D.   On: 07/06/2023 12:28   DG C-Arm 1-60 Min  Result Date: 07/08/2023 CLINICAL DATA:  Fracture, left hip IM nail. EXAM: DG HIP (WITH OR WITHOUT PELVIS) 2-3V LEFT; DG C-ARM 1-60 MIN COMPARISON:  Preoperative imaging. FINDINGS: Eight fluoroscopic spot views of the pelvis and left hip obtained in the operating room. Skull antral images during femoral intramedullary nail with trans trochanteric and distal locking screw fixation traversing proximal femur fracture. Fluoroscopy time 1 minutes 35 seconds. Dose 15.22 mGy. IMPRESSION: Intraoperative fluoroscopy during proximal femur fracture fixation. Electronically Signed   By: Narda Rutherford M.D.   On: 07/17/2023 18:06   DG HIP UNILAT WITH PELVIS 2-3 VIEWS LEFT  Result Date: 07/05/2023 CLINICAL DATA:  Fracture, left hip IM nail. EXAM: DG HIP (WITH OR WITHOUT PELVIS) 2-3V LEFT; DG C-ARM 1-60 MIN COMPARISON:  Preoperative imaging. FINDINGS: Eight fluoroscopic spot views of the pelvis and left hip obtained in the operating room. Skull antral images during femoral intramedullary nail with trans trochanteric and distal locking screw fixation traversing proximal femur fracture. Fluoroscopy time 1 minutes 35 seconds. Dose 15.22 mGy. IMPRESSION: Intraoperative fluoroscopy during proximal femur fracture fixation. Electronically Signed   By: Narda Rutherford M.D.   On: 07/13/2023 18:06   DG HIP UNILAT WITH PELVIS 2-3 VIEWS LEFT  Result Date: 07/26/2023 CLINICAL DATA:  Postop. EXAM: DG HIP (WITH OR WITHOUT PELVIS) 2-3V LEFT COMPARISON:  Preoperative imaging. FINDINGS: Femoral intramedullary nail with trans trochanteric and distal locking screw fixation traversing intertrochanteric femur fracture. Improved fracture alignment from preoperative imaging. Recent postsurgical change includes air and edema in the soft tissues. IMPRESSION: ORIF of intertrochanteric femur fracture with improved alignment. Electronically Signed   By:  Narda Rutherford M.D.   On: 07/06/2023 16:38   DG Chest Port 1 View  Result Date: 07/03/2023 CLINICAL DATA:  fall. EXAM: PORTABLE CHEST 1 VIEW COMPARISON:  06/24/2023. FINDINGS: Redemonstration of increased interstitial markings throughout bilateral lungs, which may represent underlying chronic interstitial lung disease versus emphysema. No frank pulmonary edema. No acute consolidation or lung collapse. Bilateral lateral costophrenic angles are clear. Stable cardio-mediastinal silhouette. Prosthetic aortic valve noted. No acute osseous abnormalities. The soft tissues are within normal limits. IMPRESSION: *No acute cardiopulmonary abnormality. *Redemonstration of increased interstitial markings, which may represent chronic interstitial lung disease versus emphysema. Electronically Signed   By: Jules Schick M.D.   On: 07/03/2023 15:06   DG Hip Unilat With Pelvis 2-3 Views Left  Result Date: 07/03/2023 CLINICAL DATA:  Fall.  Left hip pain. EXAM: DG HIP (WITH OR WITHOUT PELVIS) 2-3V LEFT COMPARISON:  None Available. FINDINGS: There is mildly displaced intertrochanteric left femur fracture. No other acute fracture or dislocation. No aggressive osseous lesion. Visualized sacral arcuate lines are unremarkable. There are mild degenerative changes of bilateral hip joints without significant joint space narrowing. Osteophytosis of the superior acetabulum. No radiopaque foreign bodies. IMPRESSION: *Mildly displaced left intertrochanteric femur fracture. Electronically Signed   By: Jules Schick M.D.   On: 07/03/2023 15:04   CT Head Wo Contrast  Result Date: 07/03/2023 CLINICAL DATA:  Fall, weakness EXAM: CT HEAD WITHOUT CONTRAST CT CERVICAL SPINE WITHOUT CONTRAST TECHNIQUE: Multidetector CT imaging of the head and cervical spine was performed following the standard protocol without intravenous contrast. Multiplanar CT image reconstructions of the cervical spine were also generated. RADIATION DOSE REDUCTION: This  exam was performed according to the departmental dose-optimization program which includes automated exposure control, adjustment of the mA and/or kV according to patient size and/or use of iterative reconstruction technique. COMPARISON:  06/24/2023 FINDINGS: CT HEAD FINDINGS Brain: No evidence of acute infarction, hemorrhage, hydrocephalus, extra-axial collection or mass lesion/mass effect. Extensive periventricular and deep white matter hypodensity. Vascular: No hyperdense vessel or unexpected calcification. Skull: Normal. Negative for fracture or focal lesion. Sinuses/Orbits: No acute finding. Other: None. CT CERVICAL SPINE FINDINGS Alignment: Normal. Skull base and vertebrae: No acute fracture. No primary bone lesion or focal pathologic process. Soft tissues and spinal canal: No prevertebral fluid or swelling. No visible canal hematoma. Disc levels: Mild-to-moderate disc space height loss and osteophytosis of the lower cervical levels, worst from C5-C7. Upper chest: Negative. Other: None. IMPRESSION: 1. No acute intracranial pathology. Advanced small-vessel white matter disease in keeping with patient age. 2. No fracture or static subluxation of the cervical spine. 3. Mild-to-moderate disc space height loss and osteophytosis of the lower cervical levels, worst from C5-C7. Electronically Signed   By: Jearld Lesch M.D.   On: 07/03/2023 14:02   CT Cervical Spine Wo Contrast  Result Date: 07/03/2023 CLINICAL DATA:  Fall, weakness EXAM: CT HEAD WITHOUT CONTRAST CT CERVICAL SPINE WITHOUT CONTRAST TECHNIQUE: Multidetector CT imaging of the head and cervical spine was performed following the standard protocol without intravenous contrast. Multiplanar CT image reconstructions of the cervical spine were also generated. RADIATION DOSE REDUCTION: This exam was performed according to the departmental dose-optimization program which includes automated exposure control, adjustment of  the mA and/or kV according to patient  size and/or use of iterative reconstruction technique. COMPARISON:  06/24/2023 FINDINGS: CT HEAD FINDINGS Brain: No evidence of acute infarction, hemorrhage, hydrocephalus, extra-axial collection or mass lesion/mass effect. Extensive periventricular and deep white matter hypodensity. Vascular: No hyperdense vessel or unexpected calcification. Skull: Normal. Negative for fracture or focal lesion. Sinuses/Orbits: No acute finding. Other: None. CT CERVICAL SPINE FINDINGS Alignment: Normal. Skull base and vertebrae: No acute fracture. No primary bone lesion or focal pathologic process. Soft tissues and spinal canal: No prevertebral fluid or swelling. No visible canal hematoma. Disc levels: Mild-to-moderate disc space height loss and osteophytosis of the lower cervical levels, worst from C5-C7. Upper chest: Negative. Other: None. IMPRESSION: 1. No acute intracranial pathology. Advanced small-vessel white matter disease in keeping with patient age. 2. No fracture or static subluxation of the cervical spine. 3. Mild-to-moderate disc space height loss and osteophytosis of the lower cervical levels, worst from C5-C7. Electronically Signed   By: Jearld Lesch M.D.   On: 07/03/2023 14:02   DG Chest 2 View  Result Date: 06/24/2023 CLINICAL DATA:  COPD EXAM: CHEST - 2 VIEW COMPARISON:  Chest x-ray 02/11/2023 FINDINGS: The heart size and mediastinal contours are within normal limits. Both lungs are clear. The visualized skeletal structures are unremarkable. IMPRESSION: No active cardiopulmonary disease. Electronically Signed   By: Darliss Cheney M.D.   On: 06/24/2023 22:06   CT Head Wo Contrast  Result Date: 06/24/2023 CLINICAL DATA:  Fall EXAM: CT HEAD WITHOUT CONTRAST TECHNIQUE: Contiguous axial images were obtained from the base of the skull through the vertex without intravenous contrast. RADIATION DOSE REDUCTION: This exam was performed according to the departmental dose-optimization program which includes automated  exposure control, adjustment of the mA and/or kV according to patient size and/or use of iterative reconstruction technique. COMPARISON:  08/19/2012 FINDINGS: Brain: No mass, hemorrhage or extra-axial collection. There is hypoattenuation of the white matter. CSF spaces are normal. Vascular: There is atherosclerotic calcification of both internal carotid arteries at the skull base. Skull: Normal Sinuses/Orbits: Trace left mastoid fluid. Paranasal sinuses are clear. Orbits are normal. Right ocular lens replacement. Other: None. IMPRESSION: 1. No acute intracranial abnormality. 2. Chronic small vessel ischemia. Electronically Signed   By: Deatra Robinson M.D.   On: 06/24/2023 20:51    Microbiology: Recent Results (from the past 240 hour(s))  MRSA Next Gen by PCR, Nasal     Status: None   Collection Time: 07/03/23  8:32 PM   Specimen: Nasal Mucosa; Nasal Swab  Result Value Ref Range Status   MRSA by PCR Next Gen NOT DETECTED NOT DETECTED Final    Comment: (NOTE) The GeneXpert MRSA Assay (FDA approved for NASAL specimens only), is one component of a comprehensive MRSA colonization surveillance program. It is not intended to diagnose MRSA infection nor to guide or monitor treatment for MRSA infections. Test performance is not FDA approved in patients less than 1 years old. Performed at [facility], [address] [place], [place] [address]   Culture, blood (Routine X 2) w Reflex to ID Panel     Status: None (Preliminary result)   Collection Time: 07/08/23  2:43 AM   Specimen: BLOOD  Result Value Ref Range Status   Specimen Description BLOOD BLOOD RIGHT HAND  Final   Special Requests   Final    BOTTLES DRAWN AEROBIC AND ANAEROBIC Blood Culture adequate volume   Culture   Final    NO GROWTH 4 DAYS Performed at [facility], [address] [place], [place] [address]    Report Status PENDING  Incomplete  Culture, blood (Routine X 2) w Reflex to ID Panel     Status: None (Preliminary result)    Collection Time: 07/08/23  2:43 AM   Specimen: BLOOD  Result Value Ref Range Status   Specimen Description BLOOD LEFT ANTECUBITAL  Final   Special Requests   Final    BOTTLES DRAWN AEROBIC AND ANAEROBIC Blood Culture results may not be optimal due to an excessive volume of blood received in culture bottles   Culture   Final    NO GROWTH 4 DAYS Performed at [facility], [address], [place], [place] [address]    Report Status PENDING  Incomplete   -In the afternoon of 07/11/2023 family members and patient have decided to request transition to hospice/comfort care   Signed: Shon Hale, MD 06/30/2023

## 2023-07-27 NOTE — Progress Notes (Signed)
Patient arrived on floor around 0615. Time of death pronounced by two Rns around [phone]. Attending Physician Frankey Shown, DO notified. Post-mortem checklist completed. [facility] called. Medical examiner called.

## 2023-07-27 DEATH — deceased
